# Patient Record
Sex: Female | Born: 1950
Health system: Southern US, Community
[De-identification: ages and names within clinical notes are randomized; demographics above are authoritative.]

## PROBLEM LIST (undated history)

## (undated) DIAGNOSIS — K222 Esophageal obstruction: Secondary | ICD-10-CM

## (undated) DIAGNOSIS — I493 Ventricular premature depolarization: Secondary | ICD-10-CM

## (undated) DIAGNOSIS — K589 Irritable bowel syndrome without diarrhea: Secondary | ICD-10-CM

## (undated) DIAGNOSIS — D126 Benign neoplasm of colon, unspecified: Secondary | ICD-10-CM

## (undated) DIAGNOSIS — K219 Gastro-esophageal reflux disease without esophagitis: Secondary | ICD-10-CM

## (undated) DIAGNOSIS — T7840XA Allergy, unspecified, initial encounter: Secondary | ICD-10-CM

## (undated) DIAGNOSIS — F32A Depression, unspecified: Secondary | ICD-10-CM

## (undated) DIAGNOSIS — M1990 Unspecified osteoarthritis, unspecified site: Secondary | ICD-10-CM

## (undated) DIAGNOSIS — I10 Essential (primary) hypertension: Secondary | ICD-10-CM

## (undated) DIAGNOSIS — E039 Hypothyroidism, unspecified: Secondary | ICD-10-CM

## (undated) DIAGNOSIS — R079 Chest pain, unspecified: Secondary | ICD-10-CM

## (undated) DIAGNOSIS — R609 Edema, unspecified: Secondary | ICD-10-CM

## (undated) DIAGNOSIS — R102 Pelvic and perineal pain: Secondary | ICD-10-CM

## (undated) DIAGNOSIS — IMO0002 Reserved for concepts with insufficient information to code with codable children: Secondary | ICD-10-CM

## (undated) DIAGNOSIS — D219 Benign neoplasm of connective and other soft tissue, unspecified: Secondary | ICD-10-CM

## (undated) DIAGNOSIS — K5730 Diverticulosis of large intestine without perforation or abscess without bleeding: Secondary | ICD-10-CM

## (undated) DIAGNOSIS — F329 Major depressive disorder, single episode, unspecified: Secondary | ICD-10-CM

## (undated) DIAGNOSIS — S098XXA Other specified injuries of head, initial encounter: Secondary | ICD-10-CM

## (undated) DIAGNOSIS — E785 Hyperlipidemia, unspecified: Secondary | ICD-10-CM

## (undated) HISTORY — DX: Benign neoplasm of connective and other soft tissue, unspecified: D21.9

## (undated) HISTORY — DX: Irritable bowel syndrome, unspecified: K58.9

## (undated) HISTORY — DX: Gastro-esophageal reflux disease without esophagitis: K21.9

## (undated) HISTORY — PX: DILATION AND CURETTAGE OF UTERUS: SHX78

## (undated) HISTORY — DX: Benign neoplasm of colon, unspecified: D12.6

## (undated) HISTORY — DX: Chest pain, unspecified: R07.9

## (undated) HISTORY — DX: Ventricular premature depolarization: I49.3

## (undated) HISTORY — DX: Hypothyroidism, unspecified: E03.9

## (undated) HISTORY — DX: Allergy, unspecified, initial encounter: T78.40XA

## (undated) HISTORY — DX: Hyperlipidemia, unspecified: E78.5

## (undated) HISTORY — PX: BREAST SURGERY: SHX581

## (undated) HISTORY — DX: Edema, unspecified: R60.9

## (undated) HISTORY — DX: Essential (primary) hypertension: I10

## (undated) HISTORY — DX: Esophageal obstruction: K22.2

## (undated) HISTORY — DX: Major depressive disorder, single episode, unspecified: F32.9

## (undated) HISTORY — DX: Pelvic and perineal pain: R10.2

## (undated) HISTORY — DX: Diverticulosis of large intestine without perforation or abscess without bleeding: K57.30

## (undated) HISTORY — DX: Unspecified osteoarthritis, unspecified site: M19.90

## (undated) HISTORY — DX: Other specified injuries of head, initial encounter: S09.8XXA

## (undated) HISTORY — DX: Reserved for concepts with insufficient information to code with codable children: IMO0002

## (undated) HISTORY — DX: Depression, unspecified: F32.A

---

## 1997-04-19 ENCOUNTER — Encounter: Admission: RE | Admit: 1997-04-19 | Discharge: 1997-07-18 | Payer: Self-pay | Admitting: Internal Medicine

## 1997-10-31 ENCOUNTER — Ambulatory Visit (HOSPITAL_COMMUNITY): Admission: RE | Admit: 1997-10-31 | Discharge: 1997-10-31 | Payer: Self-pay | Admitting: Obstetrics & Gynecology

## 1998-07-02 ENCOUNTER — Other Ambulatory Visit: Admission: RE | Admit: 1998-07-02 | Discharge: 1998-07-02 | Payer: Self-pay | Admitting: Internal Medicine

## 1998-08-13 ENCOUNTER — Ambulatory Visit (HOSPITAL_COMMUNITY): Admission: RE | Admit: 1998-08-13 | Discharge: 1998-08-13 | Payer: Self-pay | Admitting: Surgery

## 1998-08-13 ENCOUNTER — Encounter: Payer: Self-pay | Admitting: Surgery

## 1999-09-03 ENCOUNTER — Other Ambulatory Visit: Admission: RE | Admit: 1999-09-03 | Discharge: 1999-09-03 | Payer: Self-pay | Admitting: Internal Medicine

## 2000-10-12 ENCOUNTER — Other Ambulatory Visit: Admission: RE | Admit: 2000-10-12 | Discharge: 2000-10-12 | Payer: Self-pay | Admitting: Obstetrics & Gynecology

## 2002-02-06 ENCOUNTER — Other Ambulatory Visit: Admission: RE | Admit: 2002-02-06 | Discharge: 2002-02-06 | Payer: Self-pay | Admitting: Internal Medicine

## 2003-10-02 ENCOUNTER — Other Ambulatory Visit: Admission: RE | Admit: 2003-10-02 | Discharge: 2003-10-02 | Payer: Self-pay | Admitting: Internal Medicine

## 2004-01-04 ENCOUNTER — Ambulatory Visit: Payer: Self-pay | Admitting: Internal Medicine

## 2004-01-25 ENCOUNTER — Ambulatory Visit: Payer: Self-pay | Admitting: Internal Medicine

## 2004-01-30 ENCOUNTER — Ambulatory Visit: Payer: Self-pay | Admitting: Internal Medicine

## 2004-03-05 ENCOUNTER — Ambulatory Visit: Payer: Self-pay | Admitting: Internal Medicine

## 2004-04-30 ENCOUNTER — Ambulatory Visit: Payer: Self-pay | Admitting: Internal Medicine

## 2004-07-11 ENCOUNTER — Ambulatory Visit: Payer: Self-pay | Admitting: Internal Medicine

## 2004-08-08 ENCOUNTER — Ambulatory Visit: Payer: Self-pay | Admitting: Internal Medicine

## 2004-09-03 ENCOUNTER — Ambulatory Visit: Payer: Self-pay | Admitting: Internal Medicine

## 2004-09-03 ENCOUNTER — Other Ambulatory Visit: Admission: RE | Admit: 2004-09-03 | Discharge: 2004-09-03 | Payer: Self-pay | Admitting: Neurosurgery

## 2004-12-09 ENCOUNTER — Ambulatory Visit: Payer: Self-pay | Admitting: Internal Medicine

## 2004-12-16 ENCOUNTER — Ambulatory Visit: Payer: Self-pay

## 2005-01-26 ENCOUNTER — Ambulatory Visit: Payer: Self-pay | Admitting: Internal Medicine

## 2005-03-25 ENCOUNTER — Ambulatory Visit: Payer: Self-pay | Admitting: Internal Medicine

## 2005-03-30 ENCOUNTER — Ambulatory Visit: Payer: Self-pay | Admitting: Internal Medicine

## 2005-05-21 ENCOUNTER — Ambulatory Visit: Payer: Self-pay | Admitting: Internal Medicine

## 2005-07-31 ENCOUNTER — Ambulatory Visit: Payer: Self-pay | Admitting: Internal Medicine

## 2005-09-03 ENCOUNTER — Ambulatory Visit: Payer: Self-pay | Admitting: Internal Medicine

## 2005-09-28 ENCOUNTER — Ambulatory Visit: Payer: Self-pay | Admitting: Internal Medicine

## 2005-09-30 ENCOUNTER — Ambulatory Visit: Payer: Self-pay | Admitting: Internal Medicine

## 2005-11-05 ENCOUNTER — Ambulatory Visit: Payer: Self-pay | Admitting: Internal Medicine

## 2005-11-06 ENCOUNTER — Ambulatory Visit: Payer: Self-pay | Admitting: Gastroenterology

## 2005-11-12 ENCOUNTER — Ambulatory Visit: Payer: Self-pay

## 2005-12-07 ENCOUNTER — Ambulatory Visit: Payer: Self-pay | Admitting: Internal Medicine

## 2006-02-02 ENCOUNTER — Ambulatory Visit: Payer: Self-pay | Admitting: Internal Medicine

## 2006-02-02 ENCOUNTER — Other Ambulatory Visit: Admission: RE | Admit: 2006-02-02 | Discharge: 2006-02-02 | Payer: Self-pay | Admitting: Internal Medicine

## 2006-02-02 ENCOUNTER — Encounter: Payer: Self-pay | Admitting: Internal Medicine

## 2006-02-05 ENCOUNTER — Ambulatory Visit: Payer: Self-pay | Admitting: Gastroenterology

## 2006-02-13 ENCOUNTER — Ambulatory Visit: Payer: Self-pay | Admitting: Family Medicine

## 2006-03-05 ENCOUNTER — Ambulatory Visit: Payer: Self-pay | Admitting: Internal Medicine

## 2006-03-08 ENCOUNTER — Ambulatory Visit: Payer: Self-pay | Admitting: Internal Medicine

## 2006-03-08 LAB — CONVERTED CEMR LAB
Fecal Occult Blood: NEGATIVE
OCCULT 1: NEGATIVE
OCCULT 2: NEGATIVE
OCCULT 3: NEGATIVE
OCCULT 4: NEGATIVE
OCCULT 5: NEGATIVE

## 2006-04-05 ENCOUNTER — Ambulatory Visit: Payer: Self-pay | Admitting: Internal Medicine

## 2006-05-17 ENCOUNTER — Ambulatory Visit: Payer: Self-pay | Admitting: Internal Medicine

## 2006-05-31 ENCOUNTER — Ambulatory Visit: Payer: Self-pay | Admitting: Internal Medicine

## 2006-06-28 ENCOUNTER — Ambulatory Visit: Payer: Self-pay | Admitting: Internal Medicine

## 2006-07-19 DIAGNOSIS — I10 Essential (primary) hypertension: Secondary | ICD-10-CM | POA: Insufficient documentation

## 2006-07-19 DIAGNOSIS — J45909 Unspecified asthma, uncomplicated: Secondary | ICD-10-CM | POA: Insufficient documentation

## 2006-07-19 DIAGNOSIS — I4949 Other premature depolarization: Secondary | ICD-10-CM | POA: Insufficient documentation

## 2006-07-19 DIAGNOSIS — K589 Irritable bowel syndrome without diarrhea: Secondary | ICD-10-CM | POA: Insufficient documentation

## 2006-07-19 DIAGNOSIS — K219 Gastro-esophageal reflux disease without esophagitis: Secondary | ICD-10-CM | POA: Insufficient documentation

## 2006-07-19 DIAGNOSIS — J309 Allergic rhinitis, unspecified: Secondary | ICD-10-CM | POA: Insufficient documentation

## 2006-08-24 ENCOUNTER — Ambulatory Visit: Payer: Self-pay | Admitting: Internal Medicine

## 2006-08-24 LAB — CONVERTED CEMR LAB
BUN: 12 mg/dL (ref 6–23)
Basophils Absolute: 0 10*3/uL (ref 0.0–0.1)
Basophils Relative: 0.8 % (ref 0.0–1.0)
CO2: 31 meq/L (ref 19–32)
Calcium: 9.8 mg/dL (ref 8.4–10.5)
Creatinine, Ser: 0.7 mg/dL (ref 0.4–1.2)
GFR calc Af Amer: 111 mL/min
Hemoglobin: 13.6 g/dL (ref 12.0–15.0)
Iron: 110 ug/dL (ref 42–145)
MCHC: 33.8 g/dL (ref 30.0–36.0)
Monocytes Absolute: 0.4 10*3/uL (ref 0.2–0.7)
Monocytes Relative: 9 % (ref 3.0–11.0)
Platelets: 235 10*3/uL (ref 150–400)
Potassium: 4 meq/L (ref 3.5–5.1)
RDW: 13.3 % (ref 11.5–14.6)

## 2006-09-02 ENCOUNTER — Ambulatory Visit: Payer: Self-pay | Admitting: Internal Medicine

## 2006-09-28 ENCOUNTER — Ambulatory Visit: Payer: Self-pay | Admitting: Internal Medicine

## 2006-09-29 ENCOUNTER — Ambulatory Visit: Payer: Self-pay | Admitting: Gastroenterology

## 2006-10-11 ENCOUNTER — Ambulatory Visit: Payer: Self-pay | Admitting: Gastroenterology

## 2006-10-11 ENCOUNTER — Encounter: Payer: Self-pay | Admitting: Internal Medicine

## 2006-12-27 ENCOUNTER — Ambulatory Visit: Payer: Self-pay | Admitting: Internal Medicine

## 2007-02-24 ENCOUNTER — Telehealth: Payer: Self-pay | Admitting: Internal Medicine

## 2007-02-25 ENCOUNTER — Ambulatory Visit: Payer: Self-pay | Admitting: Cardiology

## 2007-04-11 DIAGNOSIS — F3289 Other specified depressive episodes: Secondary | ICD-10-CM | POA: Insufficient documentation

## 2007-04-11 DIAGNOSIS — F411 Generalized anxiety disorder: Secondary | ICD-10-CM | POA: Insufficient documentation

## 2007-04-11 DIAGNOSIS — F329 Major depressive disorder, single episode, unspecified: Secondary | ICD-10-CM | POA: Insufficient documentation

## 2007-04-11 DIAGNOSIS — K5730 Diverticulosis of large intestine without perforation or abscess without bleeding: Secondary | ICD-10-CM | POA: Insufficient documentation

## 2007-04-20 ENCOUNTER — Telehealth: Payer: Self-pay | Admitting: Internal Medicine

## 2007-04-21 ENCOUNTER — Ambulatory Visit: Payer: Self-pay | Admitting: Internal Medicine

## 2007-04-27 ENCOUNTER — Ambulatory Visit: Payer: Self-pay | Admitting: Internal Medicine

## 2007-04-27 LAB — CONVERTED CEMR LAB
AST: 15 units/L (ref 0–37)
Albumin: 3.9 g/dL (ref 3.5–5.2)
Alkaline Phosphatase: 65 units/L (ref 39–117)
BUN: 10 mg/dL (ref 6–23)
Bilirubin Urine: NEGATIVE
Bilirubin, Direct: 0.2 mg/dL (ref 0.0–0.3)
Chloride: 105 meq/L (ref 96–112)
Eosinophils Relative: 1.6 % (ref 0.0–5.0)
Glucose, Bld: 98 mg/dL (ref 70–99)
Glucose, Urine, Semiquant: NEGATIVE
HDL: 47.5 mg/dL (ref >39.0)
Ketones, urine, test strip: NEGATIVE
Lymphocytes Relative: 42 % (ref 12.0–46.0)
Monocytes Relative: 8.1 % (ref 3.0–12.0)
Neutrophils Relative %: 47.9 % (ref 43.0–77.0)
Nitrite: NEGATIVE
Platelets: 202 10*3/uL (ref 150–400)
Potassium: 4 meq/L (ref 3.5–5.1)
Protein, U semiquant: NEGATIVE
Sodium: 141 meq/L (ref 135–145)
Total CHOL/HDL Ratio: 5.2
Total Protein: 6.5 g/dL (ref 6.0–8.3)
Triglycerides: 104 mg/dL (ref 0–149)
Urobilinogen, UA: 0.2
VLDL: 21 mg/dL (ref 0–40)
WBC: 5.2 10*3/uL (ref 4.5–10.5)

## 2007-05-19 ENCOUNTER — Other Ambulatory Visit: Admission: RE | Admit: 2007-05-19 | Discharge: 2007-05-19 | Payer: Self-pay | Admitting: Internal Medicine

## 2007-05-19 ENCOUNTER — Encounter: Payer: Self-pay | Admitting: Internal Medicine

## 2007-05-19 ENCOUNTER — Ambulatory Visit: Payer: Self-pay | Admitting: Internal Medicine

## 2007-05-19 DIAGNOSIS — E785 Hyperlipidemia, unspecified: Secondary | ICD-10-CM | POA: Insufficient documentation

## 2007-05-19 LAB — CONVERTED CEMR LAB: LDL Goal: 130 mg/dL

## 2007-05-20 LAB — CONVERTED CEMR LAB: Pap Smear: NORMAL

## 2007-06-03 ENCOUNTER — Encounter: Payer: Self-pay | Admitting: Internal Medicine

## 2007-06-20 ENCOUNTER — Ambulatory Visit: Payer: Self-pay | Admitting: Internal Medicine

## 2007-06-20 DIAGNOSIS — M549 Dorsalgia, unspecified: Secondary | ICD-10-CM | POA: Insufficient documentation

## 2007-08-10 ENCOUNTER — Telehealth: Payer: Self-pay | Admitting: Internal Medicine

## 2007-08-22 ENCOUNTER — Ambulatory Visit: Payer: Self-pay | Admitting: Internal Medicine

## 2007-08-22 LAB — CONVERTED CEMR LAB
ALT: 19 units/L (ref 0–35)
AST: 17 units/L (ref 0–37)
Albumin: 3.9 g/dL (ref 3.5–5.2)
Cholesterol: 258 mg/dL (ref 0–200)
Direct LDL: 175.1 mg/dL
HDL: 51.6 mg/dL (ref >39.0)
Total Bilirubin: 0.9 mg/dL (ref 0.3–1.2)
Total CHOL/HDL Ratio: 5
Triglycerides: 92 mg/dL (ref 0–149)

## 2007-09-02 ENCOUNTER — Ambulatory Visit: Payer: Self-pay | Admitting: Internal Medicine

## 2007-09-12 ENCOUNTER — Ambulatory Visit: Payer: Self-pay | Admitting: Internal Medicine

## 2007-09-12 DIAGNOSIS — IMO0002 Reserved for concepts with insufficient information to code with codable children: Secondary | ICD-10-CM

## 2007-09-12 HISTORY — DX: Reserved for concepts with insufficient information to code with codable children: IMO0002

## 2007-09-16 ENCOUNTER — Telehealth: Payer: Self-pay | Admitting: Internal Medicine

## 2007-10-17 ENCOUNTER — Telehealth (INDEPENDENT_AMBULATORY_CARE_PROVIDER_SITE_OTHER): Payer: Self-pay | Admitting: *Deleted

## 2007-10-31 ENCOUNTER — Encounter: Admission: RE | Admit: 2007-10-31 | Discharge: 2007-10-31 | Payer: Self-pay | Admitting: Internal Medicine

## 2007-11-02 ENCOUNTER — Encounter: Payer: Self-pay | Admitting: Internal Medicine

## 2007-11-14 ENCOUNTER — Encounter: Payer: Self-pay | Admitting: Internal Medicine

## 2008-01-05 ENCOUNTER — Ambulatory Visit: Payer: Self-pay | Admitting: Internal Medicine

## 2008-01-05 LAB — CONVERTED CEMR LAB
ALT: 22 units/L (ref 0–35)
AST: 22 units/L (ref 0–37)
Albumin: 3.7 g/dL (ref 3.5–5.2)
Alkaline Phosphatase: 51 units/L (ref 39–117)
Bilirubin, Direct: 0.1 mg/dL (ref 0.0–0.3)
Cholesterol: 252 mg/dL (ref 0–200)
Total Protein: 6.4 g/dL (ref 6.0–8.3)
VLDL: 19 mg/dL (ref 0–40)

## 2008-01-11 ENCOUNTER — Ambulatory Visit: Payer: Self-pay | Admitting: Internal Medicine

## 2008-01-12 ENCOUNTER — Ambulatory Visit: Payer: Self-pay | Admitting: Internal Medicine

## 2008-03-26 ENCOUNTER — Ambulatory Visit: Payer: Self-pay | Admitting: Internal Medicine

## 2008-03-26 LAB — CONVERTED CEMR LAB
ALT: 19 units/L (ref 0–35)
AST: 19 units/L (ref 0–37)
Alkaline Phosphatase: 55 units/L (ref 39–117)
Bilirubin, Direct: 0.1 mg/dL (ref 0.0–0.3)
CO2: 30 meq/L (ref 19–32)
Calcium: 9.3 mg/dL (ref 8.4–10.5)
Chloride: 109 meq/L (ref 96–112)
Glucose, Bld: 96 mg/dL (ref 70–99)
Potassium: 4.1 meq/L (ref 3.5–5.1)
Sodium: 146 meq/L — ABNORMAL HIGH (ref 135–145)
Total Bilirubin: 0.8 mg/dL (ref 0.3–1.2)
Total CHOL/HDL Ratio: 4.6
Total Protein: 6.2 g/dL (ref 6.0–8.3)
Triglycerides: 79 mg/dL (ref 0–149)

## 2008-04-05 ENCOUNTER — Ambulatory Visit: Payer: Self-pay | Admitting: Internal Medicine

## 2008-04-24 ENCOUNTER — Telehealth: Payer: Self-pay | Admitting: Internal Medicine

## 2008-05-07 ENCOUNTER — Ambulatory Visit: Payer: Self-pay | Admitting: Internal Medicine

## 2008-06-04 ENCOUNTER — Encounter: Payer: Self-pay | Admitting: Internal Medicine

## 2008-06-05 ENCOUNTER — Encounter: Payer: Self-pay | Admitting: Internal Medicine

## 2008-06-29 ENCOUNTER — Ambulatory Visit: Payer: Self-pay | Admitting: Internal Medicine

## 2008-06-29 LAB — CONVERTED CEMR LAB
ALT: 19 units/L (ref 0–35)
Bilirubin, Direct: 0 mg/dL (ref 0.0–0.3)
HDL: 62.5 mg/dL (ref >39.00)
LDL Cholesterol: 111 mg/dL — ABNORMAL HIGH (ref 0–99)
Total Bilirubin: 0.9 mg/dL (ref 0.3–1.2)
VLDL: 14.4 mg/dL (ref 0.0–40.0)

## 2008-07-05 ENCOUNTER — Ambulatory Visit: Payer: Self-pay | Admitting: Internal Medicine

## 2008-07-05 DIAGNOSIS — E039 Hypothyroidism, unspecified: Secondary | ICD-10-CM | POA: Insufficient documentation

## 2008-07-10 LAB — CONVERTED CEMR LAB
Free T4: 1.3 ng/dL (ref 0.6–1.6)
T3, Free: 2.2 pg/mL — ABNORMAL LOW (ref 2.3–4.2)
TSH: 0.88 microintl units/mL (ref 0.35–5.50)

## 2008-08-08 ENCOUNTER — Telehealth: Payer: Self-pay | Admitting: Internal Medicine

## 2008-08-10 ENCOUNTER — Encounter: Payer: Self-pay | Admitting: Internal Medicine

## 2008-08-16 ENCOUNTER — Telehealth: Payer: Self-pay | Admitting: Internal Medicine

## 2008-10-09 ENCOUNTER — Ambulatory Visit: Payer: Self-pay | Admitting: Internal Medicine

## 2008-11-08 ENCOUNTER — Telehealth: Payer: Self-pay | Admitting: Internal Medicine

## 2008-11-12 ENCOUNTER — Telehealth: Payer: Self-pay | Admitting: Internal Medicine

## 2008-11-21 ENCOUNTER — Telehealth: Payer: Self-pay | Admitting: Internal Medicine

## 2008-11-29 ENCOUNTER — Ambulatory Visit: Payer: Self-pay | Admitting: Internal Medicine

## 2008-11-29 ENCOUNTER — Encounter: Payer: Self-pay | Admitting: Internal Medicine

## 2008-12-12 ENCOUNTER — Ambulatory Visit: Payer: Self-pay | Admitting: Internal Medicine

## 2008-12-24 ENCOUNTER — Telehealth: Payer: Self-pay | Admitting: Internal Medicine

## 2009-01-08 ENCOUNTER — Ambulatory Visit: Payer: Self-pay | Admitting: Internal Medicine

## 2009-01-08 ENCOUNTER — Telehealth: Payer: Self-pay | Admitting: Internal Medicine

## 2009-01-14 LAB — CONVERTED CEMR LAB
AST: 28 units/L (ref 0–37)
Alkaline Phosphatase: 51 units/L (ref 39–117)
Cholesterol: 251 mg/dL — ABNORMAL HIGH (ref 0–200)
Total Bilirubin: 0.8 mg/dL (ref 0.3–1.2)
Total CHOL/HDL Ratio: 6

## 2009-01-15 ENCOUNTER — Ambulatory Visit: Payer: Self-pay | Admitting: Internal Medicine

## 2009-01-16 ENCOUNTER — Telehealth: Payer: Self-pay | Admitting: Internal Medicine

## 2009-01-17 ENCOUNTER — Ambulatory Visit: Payer: Self-pay | Admitting: Internal Medicine

## 2009-01-17 DIAGNOSIS — R0609 Other forms of dyspnea: Secondary | ICD-10-CM | POA: Insufficient documentation

## 2009-01-17 DIAGNOSIS — R0989 Other specified symptoms and signs involving the circulatory and respiratory systems: Secondary | ICD-10-CM | POA: Insufficient documentation

## 2009-01-18 LAB — CONVERTED CEMR LAB: Pap Smear: NORMAL

## 2009-02-14 ENCOUNTER — Ambulatory Visit: Payer: Self-pay | Admitting: Internal Medicine

## 2009-02-19 ENCOUNTER — Telehealth (INDEPENDENT_AMBULATORY_CARE_PROVIDER_SITE_OTHER): Payer: Self-pay | Admitting: *Deleted

## 2009-02-19 LAB — CONVERTED CEMR LAB
BUN: 7 mg/dL (ref 6–23)
CO2: 30 meq/L (ref 19–32)
Chloride: 108 meq/L (ref 96–112)
Creatinine, Ser: 0.8 mg/dL (ref 0.4–1.2)
Glucose, Bld: 93 mg/dL (ref 70–99)

## 2009-03-08 ENCOUNTER — Telehealth: Payer: Self-pay | Admitting: Internal Medicine

## 2009-03-12 ENCOUNTER — Telehealth: Payer: Self-pay | Admitting: Internal Medicine

## 2009-03-25 ENCOUNTER — Ambulatory Visit: Payer: Self-pay | Admitting: Internal Medicine

## 2009-03-28 ENCOUNTER — Telehealth (INDEPENDENT_AMBULATORY_CARE_PROVIDER_SITE_OTHER): Payer: Self-pay | Admitting: *Deleted

## 2009-04-08 ENCOUNTER — Ambulatory Visit: Payer: Self-pay | Admitting: Internal Medicine

## 2009-04-10 LAB — CONVERTED CEMR LAB
Alkaline Phosphatase: 45 units/L (ref 39–117)
Bilirubin, Direct: 0 mg/dL (ref 0.0–0.3)
LDL Cholesterol: 80 mg/dL (ref 0–99)
Total CHOL/HDL Ratio: 3

## 2009-04-15 ENCOUNTER — Ambulatory Visit: Payer: Self-pay | Admitting: Internal Medicine

## 2009-04-15 DIAGNOSIS — F988 Other specified behavioral and emotional disorders with onset usually occurring in childhood and adolescence: Secondary | ICD-10-CM | POA: Insufficient documentation

## 2009-05-07 ENCOUNTER — Telehealth: Payer: Self-pay | Admitting: Internal Medicine

## 2009-05-08 ENCOUNTER — Telehealth (INDEPENDENT_AMBULATORY_CARE_PROVIDER_SITE_OTHER): Payer: Self-pay | Admitting: *Deleted

## 2009-06-10 ENCOUNTER — Ambulatory Visit: Payer: Self-pay | Admitting: Internal Medicine

## 2009-07-16 ENCOUNTER — Encounter: Payer: Self-pay | Admitting: Internal Medicine

## 2009-08-12 ENCOUNTER — Ambulatory Visit: Payer: Self-pay | Admitting: Internal Medicine

## 2009-08-12 DIAGNOSIS — R609 Edema, unspecified: Secondary | ICD-10-CM | POA: Insufficient documentation

## 2009-08-12 HISTORY — DX: Edema, unspecified: R60.9

## 2009-09-10 ENCOUNTER — Ambulatory Visit: Payer: Self-pay | Admitting: Internal Medicine

## 2009-09-10 DIAGNOSIS — H00029 Hordeolum internum unspecified eye, unspecified eyelid: Secondary | ICD-10-CM | POA: Insufficient documentation

## 2009-09-10 LAB — CONVERTED CEMR LAB
BUN: 7 mg/dL (ref 6–23)
Chloride: 100 meq/L (ref 96–112)
Glucose, Bld: 94 mg/dL (ref 70–99)
Potassium: 4.4 meq/L (ref 3.5–5.1)

## 2009-09-24 ENCOUNTER — Telehealth: Payer: Self-pay | Admitting: Internal Medicine

## 2009-09-26 ENCOUNTER — Telehealth: Payer: Self-pay | Admitting: Internal Medicine

## 2009-10-11 ENCOUNTER — Ambulatory Visit: Payer: Self-pay | Admitting: Internal Medicine

## 2009-10-11 LAB — CONVERTED CEMR LAB
Blood in Urine, dipstick: NEGATIVE
Glucose, Urine, Semiquant: NEGATIVE
Ketones, urine, test strip: NEGATIVE
Protein, U semiquant: NEGATIVE
WBC Urine, dipstick: NEGATIVE
pH: 7

## 2009-10-14 ENCOUNTER — Encounter: Admission: RE | Admit: 2009-10-14 | Discharge: 2009-10-14 | Payer: Self-pay | Admitting: Internal Medicine

## 2009-10-15 LAB — CONVERTED CEMR LAB
Basophils Relative: 1.2 % (ref 0.0–3.0)
CO2: 29 meq/L (ref 19–32)
Chloride: 99 meq/L (ref 96–112)
Creatinine, Ser: 0.7 mg/dL (ref 0.4–1.2)
Eosinophils Absolute: 0.2 10*3/uL (ref 0.0–0.7)
Eosinophils Relative: 2.5 % (ref 0.0–5.0)
HCT: 41.5 % (ref 36.0–46.0)
Hemoglobin: 14.3 g/dL (ref 12.0–15.0)
Lymphs Abs: 2 10*3/uL (ref 0.7–4.0)
MCHC: 34.5 g/dL (ref 30.0–36.0)
MCV: 87.9 fL (ref 78.0–100.0)
Monocytes Absolute: 0.7 10*3/uL (ref 0.1–1.0)
Neutro Abs: 3.5 10*3/uL (ref 1.4–7.7)
Neutrophils Relative %: 54.5 % (ref 43.0–77.0)
Potassium: 4 meq/L (ref 3.5–5.1)
RBC: 4.72 M/uL (ref 3.87–5.11)

## 2009-10-18 ENCOUNTER — Ambulatory Visit: Payer: Self-pay | Admitting: Internal Medicine

## 2009-10-29 ENCOUNTER — Ambulatory Visit: Payer: Self-pay | Admitting: Internal Medicine

## 2009-12-02 ENCOUNTER — Ambulatory Visit: Payer: Self-pay | Admitting: Internal Medicine

## 2009-12-02 LAB — CONVERTED CEMR LAB
ALT: 17 units/L (ref 0–35)
BUN: 14 mg/dL (ref 6–23)
Basophils Absolute: 0.1 10*3/uL (ref 0.0–0.1)
Bilirubin Urine: NEGATIVE
Bilirubin, Direct: 0.1 mg/dL (ref 0.0–0.3)
Blood in Urine, dipstick: NEGATIVE
Cholesterol: 171 mg/dL (ref 0–200)
Creatinine, Ser: 0.8 mg/dL (ref 0.4–1.2)
Eosinophils Relative: 2.1 % (ref 0.0–5.0)
GFR calc non Af Amer: 77.42 mL/min (ref >60)
Glucose, Bld: 103 mg/dL — ABNORMAL HIGH (ref 70–99)
Glucose, Urine, Semiquant: NEGATIVE
HCT: 39.1 % (ref 36.0–46.0)
Ketones, urine, test strip: NEGATIVE
LDL Cholesterol: 104 mg/dL — ABNORMAL HIGH (ref 0–99)
Lymphs Abs: 1.8 10*3/uL (ref 0.7–4.0)
MCV: 87.8 fL (ref 78.0–100.0)
Monocytes Absolute: 0.5 10*3/uL (ref 0.1–1.0)
Neutrophils Relative %: 54.3 % (ref 43.0–77.0)
Platelets: 198 10*3/uL (ref 150.0–400.0)
Protein, U semiquant: NEGATIVE
RDW: 13.9 % (ref 11.5–14.6)
TSH: 0.72 microintl units/mL (ref 0.35–5.50)
Total Bilirubin: 0.5 mg/dL (ref 0.3–1.2)
Triglycerides: 89 mg/dL (ref 0.0–149.0)
Urobilinogen, UA: 0.2
VLDL: 17.8 mg/dL (ref 0.0–40.0)
WBC: 5.4 10*3/uL (ref 4.5–10.5)
pH: 6

## 2009-12-09 ENCOUNTER — Ambulatory Visit: Payer: Self-pay | Admitting: Internal Medicine

## 2009-12-09 LAB — HM MAMMOGRAPHY

## 2010-01-01 ENCOUNTER — Telehealth: Payer: Self-pay | Admitting: Internal Medicine

## 2010-01-06 ENCOUNTER — Ambulatory Visit: Payer: Self-pay | Admitting: Internal Medicine

## 2010-02-10 ENCOUNTER — Ambulatory Visit
Admission: RE | Admit: 2010-02-10 | Discharge: 2010-02-10 | Payer: Self-pay | Source: Home / Self Care | Attending: Internal Medicine | Admitting: Internal Medicine

## 2010-02-10 LAB — CONVERTED CEMR LAB
Blood in Urine, dipstick: NEGATIVE
Glucose, Urine, Semiquant: NEGATIVE
Nitrite: NEGATIVE
Specific Gravity, Urine: <1.005
WBC Urine, dipstick: NEGATIVE
pH: 6

## 2010-02-18 NOTE — Progress Notes (Signed)
Summary: questons re: BP  Phone Note Call from Patient   Caller: Patient Call For: Stacie Glaze MD Summary of Call: Pt's BP is 146/96 and 158/88.  Taking 5 mg. Amlodipine, 12.5 mg of HCTZ (see previous phone note)  and feels she may need 10 mg. again???  What does Dr. Lovell Sheehan think?  She had edema with the higher dosage of Amlodipine.  She is going to [place]. [phone] Initial call taken by: Lynann Beaver CMA,  September 26, 2009 2:47 PM  Follow-up for Phone Call        increase carvedilol to 25 mg by mouth two times a day   Worried about pulse rate going too low.  Her pulse runs about 63. Lynann Beaver [facility]  September 27, 2009 8:10 AM  Follow-up by: Birdie Sons MD,  September 26, 2009 5:57 PM  Additional Follow-up for Phone Call Additional follow up Details #1::        may start with  25 mg in the am and 12.5 in the pm Additional Follow-up by: Stacie Glaze MD,  September 27, 2009 9:11 AM    New/Updated Medications: CARVEDILOL 12.5 MG TABS (CARVEDILOL) Take 25 mg in the am and 12.5 mg in pm.   Notified pt.

## 2010-02-18 NOTE — Assessment & Plan Note (Signed)
Summary: 3 month rov/njr   Vital Signs:  Patient profile:   60 year old female Height:      69 inches Weight:      166 pounds BMI:     24.60 Temp:     98.2 degrees F oral Pulse rate:   72 / minute Resp:     14 per minute BP sitting:   116 / 76  (left arm)  Vitals Entered By: Willy Eddy, LPN (September 10, 2009 1:36 PM) CC: roa after changing med to exforge due to ankle swelling Is Patient Diabetic? No   Primary Care Provider:  Stacie Glaze MD  CC:  roa after changing med to exforge due to ankle swelling.  History of Present Illness: the amlodipine  Preventive Screening-Counseling & Management  Alcohol-Tobacco     Smoking Status: quit     Year Quit: 1980     Passive Smoke Exposure: no  Problems Prior to Update: 1)  Edema  (ICD-782.3) 2)  Add  (ICD-314.00) 3)  Dyspnea On Exertion  (ICD-786.09) 4)  Hypertension  (ICD-401.9) 5)  Palpitations, Recurrent  (ICD-785.1) 6)  Premature Ventricular Contractions  (ICD-427.69) 7)  Unspecified Hypothyroidism  (ICD-244.9) 8)  Back Pain, Lumbar, With Radiculopathy  (ICD-724.4) 9)  Uns Advrs Eff Uns Rx Medicinal&biological Sbstnc  (ICD-995.20) 10)  Back Pain  (ICD-724.5) 11)  Preventive Health Care  (ICD-V70.0) 12)  Hyperlipidemia  (ICD-272.4) 13)  Fibroids, Uterus  (ICD-218.9) 14)  Anxiety  (ICD-300.00) 15)  Depression  (ICD-311) 16)  Diverticulosis, Sigmoid Colon  (ICD-562.10) 17)  Gerd  (ICD-530.81) 18)  Asthma  (ICD-493.90) 19)  Allergic Rhinitis  (ICD-477.9) 20)  Irritable Bowel Syndrome  (ICD-564.1)  Current Problems (verified): 1)  Edema  (ICD-782.3) 2)  Add  (ICD-314.00) 3)  Dyspnea On Exertion  (ICD-786.09) 4)  Hypertension  (ICD-401.9) 5)  Palpitations, Recurrent  (ICD-785.1) 6)  Premature Ventricular Contractions  (ICD-427.69) 7)  Unspecified Hypothyroidism  (ICD-244.9) 8)  Back Pain, Lumbar, With Radiculopathy  (ICD-724.4) 9)  Uns Advrs Eff Uns Rx Medicinal&biological Sbstnc  (ICD-995.20) 10)  Back  Pain  (ICD-724.5) 11)  Preventive Health Care  (ICD-V70.0) 12)  Hyperlipidemia  (ICD-272.4) 13)  Fibroids, Uterus  (ICD-218.9) 14)  Anxiety  (ICD-300.00) 15)  Depression  (ICD-311) 16)  Diverticulosis, Sigmoid Colon  (ICD-562.10) 17)  Gerd  (ICD-530.81) 18)  Asthma  (ICD-493.90) 19)  Allergic Rhinitis  (ICD-477.9) 20)  Irritable Bowel Syndrome  (ICD-564.1)  Medications Prior to Update: 1)  Carvedilol 12.5 Mg Tabs (Carvedilol) .... Take Two Times A Day 2)  Symbicort 80-4.5 Mcg/act  Aero (Budesonide-Formoterol Fumarate) .... Two Puff By Mouth Two Times A Day As Needed 3)  Vivelle-Dot 0.0375 Mg/24hr Pttw (Estradiol) .... Chasnge 2 Times A Week 4)  Prometrium 200 Mg Caps (Progesterone Micronized) .Marland Kitchen.. 1 Once Daily 5)  Synthroid 137 Mcg Tabs (Levothyroxine Sodium) .... One By Mouth Daily 6)  Fexofenadine Hcl 180 Mg Tabs (Fexofenadine Hcl) .Marland Kitchen.. 1 Once Daily 7)  Crestor 20 Mg Tabs (Rosuvastatin Calcium) .... One By Mouth Weekly 8)  Lorazepam 0.5 Mg Tabs (Lorazepam) .Marland Kitchen.. 1 Once Daily As Needed For Sleep 9)  Concerta 36 Mg Cr-Tabs (Methylphenidate Hcl) .... One By Mouth Q Daily 10)  Exforge Hct 10-320-25 Mg Tabs (Amlodipine-Valsartan-Hctz) .... 1/2 By Mouth Daily  Current Medications (verified): 1)  Carvedilol 12.5 Mg Tabs (Carvedilol) .... Take One  Po  Two Times A Day 2)  Symbicort 80-4.5 Mcg/act  Aero (Budesonide-Formoterol Fumarate) .... Two Puff By Mouth Two  Times A Day As Needed 3)  Vivelle-Dot 0.0375 Mg/24hr Pttw (Estradiol) .... Chasnge 2 Times A Week 4)  Prometrium 200 Mg Caps (Progesterone Micronized) .Marland Kitchen.. 1 Once Daily 5)  Synthroid 137 Mcg Tabs (Levothyroxine Sodium) .... One By Mouth Daily 6)  Crestor 20 Mg Tabs (Rosuvastatin Calcium) .... One By Mouth Weekly 7)  Lorazepam 0.5 Mg Tabs (Lorazepam) .Marland Kitchen.. 1 Once Daily As Needed For Sleep 8)  Concerta 36 Mg Cr-Tabs (Methylphenidate Hcl) .... One By Mouth Q Daily 9)  Exforge Hct 10-320-25 Mg Tabs (Amlodipine-Valsartan-Hctz) .... 1/2 By  Mouth Daily  Allergies (verified): No Known Drug Allergies  Past History:  Family History: Last updated: 07/19/2006 Family History Hypertension Family History Other cancer-Pancreatic Family History of Cardiovascular disorder Fam hx CAD  Social History: Last updated: 07/19/2006 Former Smoker Alcohol use-yes Married  Risk Factors: Smoking Status: quit (09/10/2009) Passive Smoke Exposure: no (09/10/2009)  Past medical, surgical, family and social histories (including risk factors) reviewed, and no changes noted (except as noted below).  Past Medical History: Reviewed history from 05/19/2007 and no changes required. Current Problems:  FIBROIDS, UTERUS (ICD-218.9) ANXIETY (ICD-300.00) DEPRESSION (ICD-311) DIVERTICULOSIS, SIGMOID COLON (ICD-562.10) BLUNT HEAD TRAUMA (ICD-959.01) GERD (ICD-530.81) ASTHMA (ICD-493.90) ALLERGIC RHINITIS (ICD-477.9) PREMATURE VENTRICULAR CONTRACTIONS (ICD-427.69) IRRITABLE BOWEL SYNDROME (ICD-564.1) HYPERTENSION (ICD-401.9) Hyperlipidemia  Past Surgical History: Reviewed history from 07/19/2006 and no changes required. Colonoscopy-02/05/2006 D&C Fibroid tumors- breast  Family History: Reviewed history from 07/19/2006 and no changes required. Family History Hypertension Family History Other cancer-Pancreatic Family History of Cardiovascular disorder Fam hx CAD  Social History: Reviewed history from 07/19/2006 and no changes required. Former Smoker Alcohol use-yes Married  Review of Systems  The patient denies anorexia, fever, weight loss, weight gain, vision loss, decreased hearing, hoarseness, chest pain, syncope, dyspnea on exertion, peripheral edema, prolonged cough, headaches, hemoptysis, abdominal pain, melena, hematochezia, severe indigestion/heartburn, hematuria, incontinence, genital sores, muscle weakness, suspicious skin lesions, transient blindness, difficulty walking, depression, unusual weight change, abnormal bleeding,  enlarged lymph nodes, angioedema, and breast masses.    Physical Exam  General:  uncomfortable but in no acute distresswell-developed.   Head:  normocephalic.   Eyes:  pupils equal and pupils round.   Ears:  R ear normal and L ear normal.   Nose:  External nasal examination shows no deformity or inflammation. Nasal mucosa are pink and moist without lesions or exudates. Mouth:  good dentition and pharynx pink and moist.   Neck:  supple.   Lungs:  normal respiratory effort and no wheezes.   Heart:  normal rate and regular rhythm.   Abdomen:  Bowel sounds positive,abdomen soft and non-tender without masses, organomegaly or hernias noted. Msk:  there is slight tenderness in the left lumbar region.  Muscles were perhaps slightly tighter; straight leg test was negative.  Neurovascular structures intact Neurologic:  No cranial nerve deficits noted. Station and gait are normal. Plantar reflexes are down-going bilaterally. DTRs are symmetrical throughout. Sensory, motor and coordinative functions appear intact.   Impression & Recommendations:  Problem # 1:  HYPERTENSION (ICD-401.9) The blood pressure is well controlled and the edema has resolved Her updated medication list for this problem includes:    Carvedilol 12.5 Mg Tabs (Carvedilol) .Marland Kitchen... Take one  po  two times a day    Exforge Hct 10-320-25 Mg Tabs (Amlodipine-valsartan-hctz) .Marland Kitchen... 1/2 by mouth daily  BP today: 116/76 Prior BP: 116/72 (08/12/2009)  Prior 10 Yr Risk Heart Disease: 3 % (06/10/2009)  Labs Reviewed: K+: 3.9 (02/14/2009) Creat: : 0.8 (02/14/2009)  Chol: 159 (04/08/2009)   HDL: 60.80 (04/08/2009)   LDL: 80 (04/08/2009)   TG: 91.0 (04/08/2009)  Problem # 2:  GERD (ICD-530.81)  Labs Reviewed: Hgb: 14.3 (04/27/2007)   Hct: 42.5 (04/27/2007)  Problem # 3:  HORDEOLUM, INTERNAL (ICD-373.12)  Complete Medication List: 1)  Carvedilol 12.5 Mg Tabs (Carvedilol) .... Take one  po  two times a day 2)  Symbicort 80-4.5  Mcg/act Aero (Budesonide-formoterol fumarate) .... Two puff by mouth two times a day as needed 3)  Vivelle-dot 0.0375 Mg/24hr Pttw (Estradiol) .... Chasnge 2 times a week 4)  Prometrium 200 Mg Caps (Progesterone micronized) .Marland Kitchen.. 1 once daily 5)  Synthroid 137 Mcg Tabs (Levothyroxine sodium) .... One by mouth daily 6)  Crestor 20 Mg Tabs (Rosuvastatin calcium) .... One by mouth weekly 7)  Lorazepam 0.5 Mg Tabs (Lorazepam) .Marland Kitchen.. 1 once daily as needed for sleep 8)  Concerta 36 Mg Cr-tabs (Methylphenidate hcl) .... One by mouth q daily 9)  Exforge Hct 10-320-25 Mg Tabs (Amlodipine-valsartan-hctz) .... 1/2 by mouth daily  Other Orders: Venipuncture ([address]) TLB-BMP (Basic Metabolic Panel-BMET) (80048-METABOL)  Patient Instructions: 1)  3-4 months for CPX Prescriptions: LORAZEPAM 0.5 MG TABS (LORAZEPAM) 1 once daily as needed for sleep  #30 x 3   Entered and Authorized by:   Stacie Glaze MD   Signed by:   Stacie Glaze MD on 09/10/2009   Method used:   Print then Give to Patient   RxID:   [id number] CARVEDILOL 12.5 MG TABS (CARVEDILOL) Take one  po  two times a day  #180 x 6   Entered and Authorized by:   Stacie Glaze MD   Signed by:   Stacie Glaze MD on 09/10/2009   Method used:   Electronically to        [facility] [address] [place] (retail)       [address] [place]       [place], [place]  [address]       Ph: [phone]       Fax: [phone]   RxID:   [id number] CONCERTA 36 MG CR-TABS (METHYLPHENIDATE HCL) one by mouth q daily  #30 x 0   Entered and Authorized by:   Stacie Glaze MD   Signed by:   Stacie Glaze MD on 09/10/2009   Method used:   Print then Give to Patient   RxID:   [id number] EXFORGE HCT 10-320-25 MG TABS (AMLODIPINE-VALSARTAN-HCTZ) 1/2 by mouth daily  #30 x 0   Entered and Authorized by:   Stacie Glaze MD   Signed by:   Stacie Glaze MD on 09/10/2009   Method used:   Print then Give to Patient   RxID:   [id number] CONCERTA 36  MG CR-TABS (METHYLPHENIDATE HCL) one by mouth q daily  #30 x 0   Entered and Authorized by:   Stacie Glaze MD   Signed by:   Stacie Glaze MD on 09/10/2009   Method used:   Print then Give to Patient   RxID:   [id number] CONCERTA 36 MG CR-TABS (METHYLPHENIDATE HCL) one by mouth q daily  #30 x 0   Entered and Authorized by:   Stacie Glaze MD   Signed by:   Stacie Glaze MD on 09/10/2009   Method used:   Print then Give to Patient   RxID:   [id number] EXFORGE HCT 10-320-25 MG TABS (AMLODIPINE-VALSARTAN-HCTZ) 1/2 by mouth daily  #30 x 3  Entered and Authorized by:   Stacie Glaze MD   Signed by:   Stacie Glaze MD on 09/10/2009   Method used:   Electronically to        [facility] [address] [place] (retail)       [address] [place].       [place], [place]  [address]       Ph: [phone]       Fax: [phone]   RxID:   [id number]   Appended Document: Orders Update     Clinical Lists Changes  Orders: Added new Service order of Specimen Handling ([id number]) - Signed

## 2010-02-18 NOTE — Assessment & Plan Note (Signed)
Summary: 2 month rov/njr   Vital Signs:  Patient profile:   60 year old female Height:      69 inches Weight:      166 pounds BMI:     24.60 Temp:     98.3 degrees F oral Pulse rate:   72 / minute Resp:     12 per minute BP sitting:   118 / 78  (left arm)  Vitals Entered By: Willy Eddy, LPN (Jun 10, 2009 1:32 PM) CC: roa, Hypertension Management   CC:  roa and Hypertension Management.  History of Present Illness: the pt on ADD had tried the vyvance  but when the med "ran out" at the end of the day and she had a reflex effect when she resumed the concert this was lessen concerta 36 and this is improved the weigth is down  noted slight ankle swelling with the medications noted slight bump in the blood pressure in the lat evenings to 130 range   Hypertension History:      She denies headache, chest pain, palpitations, dyspnea with exertion, orthopnea, PND, peripheral edema, visual symptoms, neurologic problems, syncope, and side effects from treatment.        Positive major cardiovascular risk factors include female age 61 years old or older, hyperlipidemia, and hypertension.  Negative major cardiovascular risk factors include negative family history for ischemic heart disease and non-tobacco-user status.     Preventive Screening-Counseling & Management  Alcohol-Tobacco     Smoking Status: quit     Year Quit: 1980     Passive Smoke Exposure: no  Problems Prior to Update: 1)  Add  (ICD-314.00) 2)  Dyspnea On Exertion  (ICD-786.09) 3)  Hypertension  (ICD-401.9) 4)  Palpitations, Recurrent  (ICD-785.1) 5)  Premature Ventricular Contractions  (ICD-427.69) 6)  Unspecified Hypothyroidism  (ICD-244.9) 7)  Back Pain, Lumbar, With Radiculopathy  (ICD-724.4) 8)  Uns Advrs Eff Uns Rx Medicinal&biological Sbstnc  (ICD-995.20) 9)  Back Pain  (ICD-724.5) 10)  Preventive Health Care  (ICD-V70.0) 11)  Hyperlipidemia  (ICD-272.4) 12)  Fibroids, Uterus  (ICD-218.9) 13)   Anxiety  (ICD-300.00) 14)  Depression  (ICD-311) 15)  Diverticulosis, Sigmoid Colon  (ICD-562.10) 16)  Gerd  (ICD-530.81) 17)  Asthma  (ICD-493.90) 18)  Allergic Rhinitis  (ICD-477.9) 19)  Irritable Bowel Syndrome  (ICD-564.1)  Current Problems (verified): 1)  Add  (ICD-314.00) 2)  Dyspnea On Exertion  (ICD-786.09) 3)  Hypertension  (ICD-401.9) 4)  Palpitations, Recurrent  (ICD-785.1) 5)  Premature Ventricular Contractions  (ICD-427.69) 6)  Unspecified Hypothyroidism  (ICD-244.9) 7)  Back Pain, Lumbar, With Radiculopathy  (ICD-724.4) 8)  Uns Advrs Eff Uns Rx Medicinal&biological Sbstnc  (ICD-995.20) 9)  Back Pain  (ICD-724.5) 10)  Preventive Health Care  (ICD-V70.0) 11)  Hyperlipidemia  (ICD-272.4) 12)  Fibroids, Uterus  (ICD-218.9) 13)  Anxiety  (ICD-300.00) 14)  Depression  (ICD-311) 15)  Diverticulosis, Sigmoid Colon  (ICD-562.10) 16)  Gerd  (ICD-530.81) 17)  Asthma  (ICD-493.90) 18)  Allergic Rhinitis  (ICD-477.9) 19)  Irritable Bowel Syndrome  (ICD-564.1)  Medications Prior to Update: 1)  Carvedilol 12.5 Mg Tabs (Carvedilol) .... Take Two Times A Day 2)  Symbicort 80-4.5 Mcg/act  Aero (Budesonide-Formoterol Fumarate) .... Two Puff By Mouth Two Times A Day As Needed 3)  Vivelle-Dot 0.025 Mg/24hr Pttw (Estradiol) .... Change 2 Times A Week 4)  Prometrium 200 Mg Caps (Progesterone Micronized) .Marland Kitchen.. 1 Once Daily 5)  Synthroid 137 Mcg Tabs (Levothyroxine Sodium) .... One By Mouth Daily  6)  Fexofenadine Hcl 180 Mg Tabs (Fexofenadine Hcl) .Marland Kitchen.. 1 Once Daily 7)  Amlodipine Besylate 10 Mg Tabs (Amlodipine Besylate) .... Take One Tablet Once Daily 8)  Crestor 20 Mg Tabs (Rosuvastatin Calcium) .... One By Mouth Weekly 9)  Hydrochlorothiazide 25 Mg Tabs (Hydrochlorothiazide) .... 1/2 Tablet Daily 10)  Lorazepam 0.5 Mg Tabs (Lorazepam) .Marland Kitchen.. 1 Once Daily As Needed 11)  Concerta 36 Mg Cr-Tabs (Methylphenidate Hcl) .... One By Mouth Q Daily  Current Medications (verified): 1)   Carvedilol 12.5 Mg Tabs (Carvedilol) .... Take Two Times A Day 2)  Symbicort 80-4.5 Mcg/act  Aero (Budesonide-Formoterol Fumarate) .... Two Puff By Mouth Two Times A Day As Needed 3)  Vivelle-Dot 0.0375 Mg/24hr Pttw (Estradiol) .... Chasnge 2 Times A Week 4)  Prometrium 200 Mg Caps (Progesterone Micronized) .Marland Kitchen.. 1 Once Daily 5)  Synthroid 137 Mcg Tabs (Levothyroxine Sodium) .... One By Mouth Daily 6)  Fexofenadine Hcl 180 Mg Tabs (Fexofenadine Hcl) .Marland Kitchen.. 1 Once Daily 7)  Amlodipine Besylate 10 Mg Tabs (Amlodipine Besylate) .... Take One Tablet Once Daily 8)  Crestor 20 Mg Tabs (Rosuvastatin Calcium) .... One By Mouth Weekly 9)  Hydrochlorothiazide 25 Mg Tabs (Hydrochlorothiazide) .... 1/2 Tablet Daily 10)  Lorazepam 0.5 Mg Tabs (Lorazepam) .Marland Kitchen.. 1 Once Daily As Needed For Sleep 11)  Concerta 36 Mg Cr-Tabs (Methylphenidate Hcl) .... One By Mouth Q Daily  Allergies (verified): No Known Drug Allergies  Past History:  Family History: Last updated: 07/19/2006 Family History Hypertension Family History Other cancer-Pancreatic Family History of Cardiovascular disorder Fam hx CAD  Social History: Last updated: 07/19/2006 Former Smoker Alcohol use-yes Married  Risk Factors: Smoking Status: quit (06/10/2009) Passive Smoke Exposure: no (06/10/2009)  Past medical, surgical, family and social histories (including risk factors) reviewed, and no changes noted (except as noted below).  Past Medical History: Reviewed history from 05/19/2007 and no changes required. Current Problems:  FIBROIDS, UTERUS (ICD-218.9) ANXIETY (ICD-300.00) DEPRESSION (ICD-311) DIVERTICULOSIS, SIGMOID COLON (ICD-562.10) BLUNT HEAD TRAUMA (ICD-959.01) GERD (ICD-530.81) ASTHMA (ICD-493.90) ALLERGIC RHINITIS (ICD-477.9) PREMATURE VENTRICULAR CONTRACTIONS (ICD-427.69) IRRITABLE BOWEL SYNDROME (ICD-564.1) HYPERTENSION (ICD-401.9) Hyperlipidemia  Past Surgical History: Reviewed history from 07/19/2006 and no  changes required. Colonoscopy-02/05/2006 D&C Fibroid tumors- breast PMH-FH-SH reviewed for relevance  Family History: Reviewed history from 07/19/2006 and no changes required. Family History Hypertension Family History Other cancer-Pancreatic Family History of Cardiovascular disorder Fam hx CAD  Social History: Reviewed history from 07/19/2006 and no changes required. Former Smoker Alcohol use-yes Married  Review of Systems  The patient denies anorexia, fever, weight loss, weight gain, vision loss, decreased hearing, hoarseness, chest pain, syncope, dyspnea on exertion, peripheral edema, prolonged cough, headaches, hemoptysis, abdominal pain, melena, hematochezia, severe indigestion/heartburn, hematuria, incontinence, genital sores, muscle weakness, suspicious skin lesions, transient blindness, difficulty walking, depression, unusual weight change, abnormal bleeding, enlarged lymph nodes, angioedema, breast masses, and testicular masses.    Physical Exam  General:  uncomfortable but in no acute distresswell-developed.   Head:  normocephalic.   Eyes:  pupils equal and pupils round.   Ears:  R ear normal and L ear normal.   Nose:  External nasal examination shows no deformity or inflammation. Nasal mucosa are pink and moist without lesions or exudates. Mouth:  good dentition and pharynx pink and moist.   Neck:  supple.   Lungs:  normal respiratory effort and no wheezes.   Heart:  normal rate and regular rhythm.     Complete Medication List: 1)  Carvedilol 12.5 Mg Tabs (Carvedilol) .... Take two times a  day 2)  Symbicort 80-4.5 Mcg/act Aero (Budesonide-formoterol fumarate) .... Two puff by mouth two times a day as needed 3)  Vivelle-dot 0.0375 Mg/24hr Pttw (Estradiol) .... Chasnge 2 times a week 4)  Prometrium 200 Mg Caps (Progesterone micronized) .Marland Kitchen.. 1 once daily 5)  Synthroid 137 Mcg Tabs (Levothyroxine sodium) .... One by mouth daily 6)  Fexofenadine Hcl 180 Mg Tabs  (Fexofenadine hcl) .Marland Kitchen.. 1 once daily 7)  Amlodipine Besylate 10 Mg Tabs (Amlodipine besylate) .... Take one tablet once daily 8)  Crestor 20 Mg Tabs (Rosuvastatin calcium) .... One by mouth weekly 9)  Hydrochlorothiazide 25 Mg Tabs (Hydrochlorothiazide) .... 1/2 tablet daily 10)  Lorazepam 0.5 Mg Tabs (Lorazepam) .Marland Kitchen.. 1 once daily as needed for sleep 11)  Concerta 36 Mg Cr-tabs (Methylphenidate hcl) .... One by mouth q daily  Hypertension Assessment/Plan:      The patient's hypertensive risk group is category B: At least one risk factor (excluding diabetes) with no target organ damage.  Her calculated 10 year risk of coronary heart disease is 3 %.  Today's blood pressure is 118/78.  Her blood pressure goal is < 140/90.  Patient Instructions: 1)  Please schedule a follow-up appointment in 3 months. Prescriptions: SYNTHROID 137 MCG TABS (LEVOTHYROXINE SODIUM) one by mouth daily  #90 x 3   Entered and Authorized by:   Stacie Glaze MD   Signed by:   Stacie Glaze MD on 06/10/2009   Method used:   Electronically to        [facility] [address] [place] (retail)       [address] [place]       [place], [place]  [address]       Ph: [phone]       Fax: [phone]   RxID:   [id number] CONCERTA 36 MG CR-TABS (METHYLPHENIDATE HCL) one by mouth q daily  #30 x 0   Entered and Authorized by:   Stacie Glaze MD   Signed by:   Stacie Glaze MD on 06/10/2009   Method used:   Print then Give to Patient   RxID:   [id number] CONCERTA 36 MG CR-TABS (METHYLPHENIDATE HCL) one by mouth q daily  #30 x 0   Entered and Authorized by:   Stacie Glaze MD   Signed by:   Stacie Glaze MD on 06/10/2009   Method used:   Print then Give to Patient   RxID:   [id number] CONCERTA 36 MG CR-TABS (METHYLPHENIDATE HCL) one by mouth q daily  #30 x 0   Entered and Authorized by:   Stacie Glaze MD   Signed by:   Stacie Glaze MD on 06/10/2009   Method used:   Print then Give to Patient   RxID:    [id number]

## 2010-02-18 NOTE — Assessment & Plan Note (Signed)
Summary: cpx--no pap//ccm   Vital Signs:  Patient profile:   60 year old female Height:      69 inches Weight:      166 pounds BMI:     24.60 Temp:     98.2 degrees F rectal Pulse rate:   72 / minute Resp:     14 per minute BP sitting:   130 / 74  (left arm)  Vitals Entered By: Willy Eddy, LPN (December 09, 2009 3:05 PM) CC: cpx, Hypertension Management Is Patient Diabetic? No   Primary Care Provider:  Stacie Glaze MD  CC:  cpx and Hypertension Management.  History of Present Illness: feels wipped out on the exforge... good blood presure controll but feels tires concern with reaction with the concerta. Asthma stable HTN satble  Hypertension History:      She denies headache, chest pain, palpitations, dyspnea with exertion, orthopnea, PND, peripheral edema, visual symptoms, neurologic problems, syncope, and side effects from treatment.        Positive major cardiovascular risk factors include female age 50 years old or older, hyperlipidemia, and hypertension.  Negative major cardiovascular risk factors include negative family history for ischemic heart disease and non-tobacco-user status.     Preventive Screening-Counseling & Management  Alcohol-Tobacco     Smoking Status: quit     Year Quit: 1980     Passive Smoke Exposure: no     Tobacco Counseling: to remain off tobacco products  Problems Prior to Update: 1)  Attention Deficit Hyperactivity Disorder, Adult  (ICD-314.01) 2)  Pelvic Pain, Acute  (ICD-789.09) 3)  Hordeolum, Internal  (ICD-373.12) 4)  Edema  (ICD-782.3) 5)  Add  (ICD-314.00) 6)  Dyspnea On Exertion  (ICD-786.09) 7)  Hypertension  (ICD-401.9) 8)  Palpitations, Recurrent  (ICD-785.1) 9)  Premature Ventricular Contractions  (ICD-427.69) 10)  Unspecified Hypothyroidism  (ICD-244.9) 11)  Back Pain, Lumbar, With Radiculopathy  (ICD-724.4) 12)  Uns Advrs Eff Uns Rx Medicinal&biological Sbstnc  (ICD-995.20) 13)  Back Pain  (ICD-724.5) 14)   Preventive Health Care  (ICD-V70.0) 15)  Hyperlipidemia  (ICD-272.4) 16)  Fibroids, Uterus  (ICD-218.9) 17)  Anxiety  (ICD-300.00) 18)  Depression  (ICD-311) 19)  Diverticulosis, Sigmoid Colon  (ICD-562.10) 20)  Gerd  (ICD-530.81) 21)  Asthma  (ICD-493.90) 22)  Allergic Rhinitis  (ICD-477.9) 23)  Irritable Bowel Syndrome  (ICD-564.1)  Current Problems (verified): 1)  Attention Deficit Hyperactivity Disorder, Adult  (ICD-314.01) 2)  Pelvic Pain, Acute  (ICD-789.09) 3)  Hordeolum, Internal  (ICD-373.12) 4)  Edema  (ICD-782.3) 5)  Add  (ICD-314.00) 6)  Dyspnea On Exertion  (ICD-786.09) 7)  Hypertension  (ICD-401.9) 8)  Palpitations, Recurrent  (ICD-785.1) 9)  Premature Ventricular Contractions  (ICD-427.69) 10)  Unspecified Hypothyroidism  (ICD-244.9) 11)  Back Pain, Lumbar, With Radiculopathy  (ICD-724.4) 12)  Uns Advrs Eff Uns Rx Medicinal&biological Sbstnc  (ICD-995.20) 13)  Back Pain  (ICD-724.5) 14)  Preventive Health Care  (ICD-V70.0) 15)  Hyperlipidemia  (ICD-272.4) 16)  Fibroids, Uterus  (ICD-218.9) 17)  Anxiety  (ICD-300.00) 18)  Depression  (ICD-311) 19)  Diverticulosis, Sigmoid Colon  (ICD-562.10) 20)  Gerd  (ICD-530.81) 21)  Asthma  (ICD-493.90) 22)  Allergic Rhinitis  (ICD-477.9) 23)  Irritable Bowel Syndrome  (ICD-564.1)  Medications Prior to Update: 1)  Carvedilol 12.5 Mg Tabs (Carvedilol) .... One By Mouth Bid 2)  Symbicort 80-4.5 Mcg/act  Aero (Budesonide-Formoterol Fumarate) .... Two Puff By Mouth Two Times A Day As Needed 3)  Vivelle-Dot 0.0375 Mg/24hr Pttw (Estradiol) .Marland KitchenMarland KitchenMarland Kitchen  Chasnge 2 Times A Week 4)  Prometrium 200 Mg Caps (Progesterone Micronized) .Marland Kitchen.. 1 Once Daily 5)  Synthroid 137 Mcg Tabs (Levothyroxine Sodium) .... One By Mouth Daily 6)  Crestor 20 Mg Tabs (Rosuvastatin Calcium) .... One By Mouth Weekly 7)  Lorazepam 0.5 Mg Tabs (Lorazepam) .Marland Kitchen.. 1 Once Daily As Needed For Sleep 8)  Vyvanse 40 Mg Caps (Lisdexamfetamine Dimesylate) .... One By Mouth 9)   Exforge Hct 10-160-25 Mg Tabs (Amlodipine-Valsartan-Hctz) .... 1/2 By Mouth Daily  Current Medications (verified): 1)  Carvedilol 12.5 Mg Tabs (Carvedilol) .... One By Mouth Bid 2)  Symbicort 80-4.5 Mcg/act  Aero (Budesonide-Formoterol Fumarate) .... Two Puff By Mouth Two Times A Day As Needed 3)  Vivelle-Dot 0.0375 Mg/24hr Pttw (Estradiol) .... Chasnge 2 Times A Week 4)  Prometrium 200 Mg Caps (Progesterone Micronized) .Marland Kitchen.. 1 Once Daily 5)  Synthroid 137 Mcg Tabs (Levothyroxine Sodium) .... One By Mouth Daily 6)  Lorazepam 0.5 Mg Tabs (Lorazepam) .Marland Kitchen.. 1 Once Daily As Needed For Sleep 7)  Tribenzor 40-5-25 Mg Tabs (Olmesartan-Amlodipine-Hctz) .... 1/2 By Mouth Daily 8)  Vyvanse 50 Mg Caps (Lisdexamfetamine Dimesylate) .... One By Mouth Daily  Allergies (verified): No Known Drug Allergies  Past History:  Family History: Last updated: 07/19/2006 Family History Hypertension Family History Other cancer-Pancreatic Family History of Cardiovascular disorder Fam hx CAD  Social History: Last updated: 07/19/2006 Former Smoker Alcohol use-yes Married  Risk Factors: Smoking Status: quit (12/09/2009) Passive Smoke Exposure: no (12/09/2009)  Past medical, surgical, family and social histories (including risk factors) reviewed, and no changes noted (except as noted below).  Past Medical History: Reviewed history from 05/19/2007 and no changes required. Current Problems:  FIBROIDS, UTERUS (ICD-218.9) ANXIETY (ICD-300.00) DEPRESSION (ICD-311) DIVERTICULOSIS, SIGMOID COLON (ICD-562.10) BLUNT HEAD TRAUMA (ICD-959.01) GERD (ICD-530.81) ASTHMA (ICD-493.90) ALLERGIC RHINITIS (ICD-477.9) PREMATURE VENTRICULAR CONTRACTIONS (ICD-427.69) IRRITABLE BOWEL SYNDROME (ICD-564.1) HYPERTENSION (ICD-401.9) Hyperlipidemia  Past Surgical History: Reviewed history from 07/19/2006 and no changes required. Colonoscopy-02/05/2006 D&C Fibroid tumors- breast  Family History: Reviewed history from  07/19/2006 and no changes required. Family History Hypertension Family History Other cancer-Pancreatic Family History of Cardiovascular disorder Fam hx CAD  Social History: Reviewed history from 07/19/2006 and no changes required. Former Smoker Alcohol use-yes Married  Review of Systems  The patient denies anorexia, fever, weight loss, weight gain, vision loss, decreased hearing, hoarseness, chest pain, syncope, dyspnea on exertion, peripheral edema, prolonged cough, headaches, hemoptysis, abdominal pain, melena, hematochezia, severe indigestion/heartburn, hematuria, incontinence, genital sores, muscle weakness, suspicious skin lesions, transient blindness, difficulty walking, depression, unusual weight change, abnormal bleeding, enlarged lymph nodes, angioedema, and breast masses.    Physical Exam  General:  well-developed and well-nourished.   Head:  normocephalic.   Eyes:  pupils equal and pupils round.   Ears:  R ear normal and L ear normal.   Nose:  no nasal discharge.   Mouth:  pharynx pink and moist.   Neck:  supple.   Heart:  normal rate and regular rhythm.   Abdomen:  soft, normal bowel sounds, RLQ tenderness, and LUQ tenderness.   Msk:  normal ROM and no joint tenderness.   Pulses:  R and L carotid,radial,femoral,dorsalis pedis and posterior tibial pulses are full and equal bilaterally Extremities:  No clubbing, cyanosis, edema, or deformity noted with normal full range of motion of all joints.   Neurologic:  alert & oriented X3.     Impression & Recommendations:  Problem # 1:  PREVENTIVE HEALTH CARE (ICD-V70.0) Assessment Unchanged The pt was asked about all immunizations,  health maint. services that are appropriate to their age and was given guidance on diet exercize  and weight management  Mammogram: normal (08/16/2009) Pap smear: normal (01/18/2009) Colonoscopy: normal (01/19/2006) Td Booster: Historical (11/21/2003)   Flu Vax: Fluvax 3+ (10/11/2009)   Chol:  171 (12/02/2009)   HDL: 49.20 (12/02/2009)   LDL: 104 (12/02/2009)   TG: 89.0 (12/02/2009) TSH: 0.72 (12/02/2009)   Next mammogram due:: 08/2010 (12/09/2009) Next Colonoscopy due:: 01/2016 (04/05/2008)  Discussed using sunscreen, use of alcohol, drug use, self breast exam, routine dental care, routine eye care, schedule for GYN exam, routine physical exam, seat belts, multiple vitamins, osteoporosis prevention, adequate calcium intake in diet, recommendations for immunizations, mammograms and Pap smears.  Discussed exercise and checking cholesterol.  Discussed gun safety, safe sex, and contraception.  Problem # 2:  ADD (ICD-314.00) change medicaitons  Problem # 3:  HYPERTENSION (ICD-401.9)  Her updated medication list for this problem includes:    Carvedilol 12.5 Mg Tabs (Carvedilol) ..... One by mouth bid    Tribenzor 40-5-25 Mg Tabs (Olmesartan-amlodipine-hctz) .Marland Kitchen... 1/2 by mouth daily  BP today: 130/74 Prior BP: 124/78 (10/29/2009)  10 Yr Risk Heart Disease: 11 % Prior 10 Yr Risk Heart Disease: 5 % (10/11/2009)  Labs Reviewed: K+: 4.0 (12/02/2009) Creat: : 0.8 (12/02/2009)   Chol: 171 (12/02/2009)   HDL: 49.20 (12/02/2009)   LDL: 104 (12/02/2009)   TG: 89.0 (12/02/2009)  Complete Medication List: 1)  Carvedilol 12.5 Mg Tabs (Carvedilol) .... One by mouth bid 2)  Symbicort 80-4.5 Mcg/act Aero (Budesonide-formoterol fumarate) .... Two puff by mouth two times a day as needed 3)  Vivelle-dot 0.0375 Mg/24hr Pttw (Estradiol) .... Chasnge 2 times a week 4)  Prometrium 200 Mg Caps (Progesterone micronized) .Marland Kitchen.. 1 once daily 5)  Synthroid 137 Mcg Tabs (Levothyroxine sodium) .... One by mouth daily 6)  Lorazepam 0.5 Mg Tabs (Lorazepam) .Marland Kitchen.. 1 once daily as needed for sleep 7)  Tribenzor 40-5-25 Mg Tabs (Olmesartan-amlodipine-hctz) .... 1/2 by mouth daily 8)  Vyvanse 50 Mg Caps (Lisdexamfetamine dimesylate) .... One by mouth daily  Hypertension Assessment/Plan:      The patient's  hypertensive risk group is category B: At least one risk factor (excluding diabetes) with no target organ damage.  Her calculated 10 year risk of coronary heart disease is 11 %.  Today's blood pressure is 130/74.  Her blood pressure goal is < 140/90.  Patient Instructions: 1)  Please schedule a follow-up appointment in 2 months. Prescriptions: VYVANSE 50 MG CAPS (LISDEXAMFETAMINE DIMESYLATE) one by mouth daily  #30 x 0   Entered and Authorized by:   Stacie Glaze MD   Signed by:   Stacie Glaze MD on 12/09/2009   Method used:   Print then Give to Patient   RxID:   [id number] VYVANSE 50 MG CAPS (LISDEXAMFETAMINE DIMESYLATE) one by mouth daily  #30 x 0   Entered and Authorized by:   Stacie Glaze MD   Signed by:   Stacie Glaze MD on 12/09/2009   Method used:   Print then Give to Patient   RxID:   [id number]    Orders Added: 1)  Est. Patient 40-64 years [99396] 2)  Est. Patient Level III [59563]   Immunization History:  Tetanus/Td Immunization History:    Tetanus/Td:  historical (11/21/2003)   Immunization History:  Tetanus/Td Immunization History:    Tetanus/Td:  Historical (11/21/2003)   Preventive Care Screening  Mammogram:    Date:  08/16/2009    Next  Due:  08/2010    Results:  normal   Pap Smear:    Date:  01/18/2009    Next Due:  01/2010    Results:  normal

## 2010-02-18 NOTE — Progress Notes (Signed)
Summary: Concerta dosage?  Phone Note Call from Patient   Caller: Patient Call For: Stacie Glaze MD Reason for Call: Acute Illness, Talk to Nurse Summary of Call: Pt thinks the Concerta 36 mg is too strong for her.  She remembers it made her anxious.  Husband picked up prescription today, and she wants to know what can be done if Dr. Lovell Sheehan will lower the dosage>???  Do they have to drive back to [place] again today? [phone] Initial call taken by: Lynann Beaver CMA,  May 08, 2009 11:59 AM  Follow-up for Phone Call        the range for concerta is to 54 and the 36 is in the middle- give it a try.  Follow-up by: Willy Eddy, LPN,  May 08, 2009 2:50 PM  Additional Follow-up for Phone Call Additional follow up Details #1::        Phone Call Completed Additional Follow-up by: Raechel Ache, RN,  May 08, 2009 2:54 PM     Appended Document: Concerta dosage? per dr Lovell Sheehan- to give it a try

## 2010-02-18 NOTE — Progress Notes (Signed)
   Phone Note Outgoing Call   Call placed by: Duncan Dull, RN, BSN,  February 19, 2009 11:24 AM Call placed to: Patient Summary of Call: Called patient and left message on machine  To inform pt of lab results Duncan Dull, RN, BSN  February 19, 2009 11:24 AM   Follow-up for Phone Call        Pt aware of results Follow-up by: Duncan Dull, RN, BSN,  February 19, 2009 2:37 PM

## 2010-02-18 NOTE — Assessment & Plan Note (Signed)
Summary: BP MED CONCERNS // RS   Vital Signs:  Patient profile:   60 year old female Height:      69 inches Weight:      166 pounds BMI:     24.60 Temp:     98.2 degrees F oral Pulse rate:   72 / minute Resp:     14 per minute BP sitting:   130 / 80  (left arm)  Vitals Entered By: Willy Eddy, LPN (October 11, 2009 12:19 PM) CC: roa- stopped dexilent and exforge due to abd pain, but continues to have pain since stopping, Hypertension Management Is Patient Diabetic? No   Primary Care Provider:  Stacie Glaze MD  CC:  roa- stopped dexilent and exforge due to abd pain, but continues to have pain since stopping, and Hypertension Management.  History of Present Illness: Hsa not felt well fro several stopped the exforge and resumed the amlodepine, corge and HTCZ The pts has nausea, light headed and low stomach pains with loose stools stopped the exforge has  one episode of severe dizzyness ( one episode) and recurrent lightheadednesss the increased coreg is related to these symptom  Has noted lower adominal pain and some dark stools  Colonoscopy 2008 normal no vaginal discharge some urine clouding , no sense of urgency  Hypertension History:      She denies headache, chest pain, palpitations, dyspnea with exertion, orthopnea, PND, peripheral edema, visual symptoms, neurologic problems, syncope, and side effects from treatment.        Positive major cardiovascular risk factors include female age 53 years old or older, hyperlipidemia, and hypertension.  Negative major cardiovascular risk factors include negative family history for ischemic heart disease and non-tobacco-user status.     Preventive Screening-Counseling & Management  Alcohol-Tobacco     Smoking Status: quit     Year Quit: 1980     Passive Smoke Exposure: no  Problems Prior to Update: 1)  Pelvic Pain, Acute  (ICD-789.09) 2)  Hordeolum, Internal  (ICD-373.12) 3)  Edema  (ICD-782.3) 4)  Add   (ICD-314.00) 5)  Dyspnea On Exertion  (ICD-786.09) 6)  Hypertension  (ICD-401.9) 7)  Palpitations, Recurrent  (ICD-785.1) 8)  Premature Ventricular Contractions  (ICD-427.69) 9)  Unspecified Hypothyroidism  (ICD-244.9) 10)  Back Pain, Lumbar, With Radiculopathy  (ICD-724.4) 11)  Uns Advrs Eff Uns Rx Medicinal&biological Sbstnc  (ICD-995.20) 12)  Back Pain  (ICD-724.5) 13)  Preventive Health Care  (ICD-V70.0) 14)  Hyperlipidemia  (ICD-272.4) 15)  Fibroids, Uterus  (ICD-218.9) 16)  Anxiety  (ICD-300.00) 17)  Depression  (ICD-311) 18)  Diverticulosis, Sigmoid Colon  (ICD-562.10) 19)  Gerd  (ICD-530.81) 20)  Asthma  (ICD-493.90) 21)  Allergic Rhinitis  (ICD-477.9) 22)  Irritable Bowel Syndrome  (ICD-564.1)  Current Problems (verified): 1)  Hordeolum, Internal  (ICD-373.12) 2)  Edema  (ICD-782.3) 3)  Add  (ICD-314.00) 4)  Dyspnea On Exertion  (ICD-786.09) 5)  Hypertension  (ICD-401.9) 6)  Palpitations, Recurrent  (ICD-785.1) 7)  Premature Ventricular Contractions  (ICD-427.69) 8)  Unspecified Hypothyroidism  (ICD-244.9) 9)  Back Pain, Lumbar, With Radiculopathy  (ICD-724.4) 10)  Uns Advrs Eff Uns Rx Medicinal&biological Sbstnc  (ICD-995.20) 11)  Back Pain  (ICD-724.5) 12)  Preventive Health Care  (ICD-V70.0) 13)  Hyperlipidemia  (ICD-272.4) 14)  Fibroids, Uterus  (ICD-218.9) 15)  Anxiety  (ICD-300.00) 16)  Depression  (ICD-311) 17)  Diverticulosis, Sigmoid Colon  (ICD-562.10) 18)  Gerd  (ICD-530.81) 19)  Asthma  (ICD-493.90) 20)  Allergic Rhinitis  (  ICD-477.9) 21)  Irritable Bowel Syndrome  (ICD-564.1)  Medications Prior to Update: 1)  Carvedilol 12.5 Mg Tabs (Carvedilol) .... Take 25 Mg in The Am and 12.5 Mg in Pm. 2)  Symbicort 80-4.5 Mcg/act  Aero (Budesonide-Formoterol Fumarate) .... Two Puff By Mouth Two Times A Day As Needed 3)  Vivelle-Dot 0.0375 Mg/24hr Pttw (Estradiol) .... Chasnge 2 Times A Week 4)  Prometrium 200 Mg Caps (Progesterone Micronized) .Marland Kitchen.. 1 Once  Daily 5)  Synthroid 137 Mcg Tabs (Levothyroxine Sodium) .... One By Mouth Daily 6)  Crestor 20 Mg Tabs (Rosuvastatin Calcium) .... One By Mouth Weekly 7)  Lorazepam 0.5 Mg Tabs (Lorazepam) .Marland Kitchen.. 1 Once Daily As Needed For Sleep 8)  Concerta 36 Mg Cr-Tabs (Methylphenidate Hcl) .... One By Mouth Q Daily 9)  Amlodipine Besylate 5 Mg Tabs (Amlodipine Besylate) .... One By Mouth Daily 10)  Hydrochlorothiazide 12.5 Mg Caps (Hydrochlorothiazide) .... One By Mouth Daily  Current Medications (verified): 1)  Carvedilol 12.5 Mg Tabs (Carvedilol) .... One By Mouth Bid 2)  Symbicort 80-4.5 Mcg/act  Aero (Budesonide-Formoterol Fumarate) .... Two Puff By Mouth Two Times A Day As Needed 3)  Vivelle-Dot 0.0375 Mg/24hr Pttw (Estradiol) .... Chasnge 2 Times A Week 4)  Prometrium 200 Mg Caps (Progesterone Micronized) .Marland Kitchen.. 1 Once Daily 5)  Synthroid 137 Mcg Tabs (Levothyroxine Sodium) .... One By Mouth Daily 6)  Crestor 20 Mg Tabs (Rosuvastatin Calcium) .... One By Mouth Weekly 7)  Lorazepam 0.5 Mg Tabs (Lorazepam) .Marland Kitchen.. 1 Once Daily As Needed For Sleep 8)  Concerta 36 Mg Cr-Tabs (Methylphenidate Hcl) .... One By Mouth Q Daily 9)  Tribenzor 20-5-12.5 Mg Tabs (Olmesartan-Amlodipine-Hctz) .... 1/2 By Mouth Daily in Am  Allergies (verified): No Known Drug Allergies  Past History:  Family History: Last updated: 07/19/2006 Family History Hypertension Family History Other cancer-Pancreatic Family History of Cardiovascular disorder Fam hx CAD  Social History: Last updated: 07/19/2006 Former Smoker Alcohol use-yes Married  Risk Factors: Smoking Status: quit (10/11/2009) Passive Smoke Exposure: no (10/11/2009)  Past medical, surgical, family and social histories (including risk factors) reviewed, and no changes noted (except as noted below).  Past Medical History: Reviewed history from 05/19/2007 and no changes required. Current Problems:  FIBROIDS, UTERUS (ICD-218.9) ANXIETY (ICD-300.00) DEPRESSION  (ICD-311) DIVERTICULOSIS, SIGMOID COLON (ICD-562.10) BLUNT HEAD TRAUMA (ICD-959.01) GERD (ICD-530.81) ASTHMA (ICD-493.90) ALLERGIC RHINITIS (ICD-477.9) PREMATURE VENTRICULAR CONTRACTIONS (ICD-427.69) IRRITABLE BOWEL SYNDROME (ICD-564.1) HYPERTENSION (ICD-401.9) Hyperlipidemia  Past Surgical History: Reviewed history from 07/19/2006 and no changes required. Colonoscopy-02/05/2006 D&C Fibroid tumors- breast  Family History: Reviewed history from 07/19/2006 and no changes required. Family History Hypertension Family History Other cancer-Pancreatic Family History of Cardiovascular disorder Fam hx CAD  Social History: Reviewed history from 07/19/2006 and no changes required. Former Smoker Alcohol use-yes Married  Review of Systems  The patient denies anorexia, fever, weight loss, weight gain, vision loss, decreased hearing, hoarseness, chest pain, syncope, dyspnea on exertion, peripheral edema, prolonged cough, headaches, hemoptysis, abdominal pain, hematochezia, severe indigestion/heartburn, hematuria, incontinence, genital sores, muscle weakness, suspicious skin lesions, transient blindness, difficulty walking, depression, unusual weight change, abnormal bleeding, enlarged lymph nodes, angioedema, and breast masses.         Flu Vaccine Consent Questions     Do you have a history of severe allergic reactions to this vaccine? no    Any prior history of allergic reactions to egg and/or gelatin? no    Do you have a sensitivity to the preservative Thimersol? no    Do you have a past history of Guillan-Barre  Syndrome? no    Do you currently have an acute febrile illness? no    Have you ever had a severe reaction to latex? no    Vaccine information given and explained to patient? yes    Are you currently pregnant? no    Lot Number:[id number]   Exp Date:07/19/2010   Site Given  Left Deltoid IM   Physical Exam  General:  uncomfortable but in no acute distresswell-developed.     Head:  normocephalic.   Eyes:  pupils equal and pupils round.   Ears:  R ear normal and L ear normal.   Nose:  no external deformity and no nasal discharge.   Mouth:  pharynx pink and moist.   Neck:  supple.   Lungs:  normal respiratory effort and no wheezes.   Heart:  normal rate and regular rhythm.   Abdomen:  soft, normal bowel sounds, RLQ tenderness, and LUQ tenderness.   Msk:  normal ROM and no joint tenderness.   Extremities:  No clubbing, cyanosis, edema, or deformity noted with normal full range of motion of all joints.   Neurologic:  alert & oriented X3.     Impression & Recommendations:  Problem # 1:  PELVIC PAIN, ACUTE (ICD-789.09) Assessment New  Discussed use of medications, application of heat or cold, and exercises.   Orders: UA Dipstick w/o Micro (automated)  (81003) Radiology Referral (Radiology) Venipuncture (16109) Specimen Handling (60454) TLB-CBC Platelet - w/Differential (85025-CBCD) TLB-CEA (Carcinoembryonic Antigen) (82378-CEA) fro trasnvaginla pelvic US  Problem # 2:  FIBROIDS, UTERUS (ICD-218.9) Assessment: Deteriorated as above  Problem # 3:  HYPERTENSION (ICD-401.9)  The following medications were removed from the medication list:    Hydrochlorothiazide 12.5 Mg Caps (Hydrochlorothiazide) ..... One by mouth daily Her updated medication list for this problem includes:    Carvedilol 12.5 Mg Tabs (Carvedilol) ..... One by mouth bid    Tribenzor 20-5-12.5 Mg Tabs (Olmesartan-amlodipine-hctz) .Marland Kitchen... 1/2 by mouth daily in am  Orders: TLB-BMP (Basic Metabolic Panel-BMET) (80048-METABOL)  BP today: 130/80 Prior BP: 116/76 (09/10/2009)  10 Yr Risk Heart Disease: 5 % Prior 10 Yr Risk Heart Disease: 3 % (06/10/2009)  Labs Reviewed: K+: 4.4 (09/10/2009) Creat: : 0.7 (09/10/2009)   Chol: 159 (04/08/2009)   HDL: 60.80 (04/08/2009)   LDL: 80 (04/08/2009)   TG: 91.0 (04/08/2009)  Problem # 4:  GERD (ICD-530.81) CBC and stool cards Labs  Reviewed: Hgb: 14.3 (04/27/2007)   Hct: 42.5 (04/27/2007)  Complete Medication List: 1)  Carvedilol 12.5 Mg Tabs (Carvedilol) .... One by mouth bid 2)  Symbicort 80-4.5 Mcg/act Aero (Budesonide-formoterol fumarate) .... Two puff by mouth two times a day as needed 3)  Vivelle-dot 0.0375 Mg/24hr Pttw (Estradiol) .... Chasnge 2 times a week 4)  Prometrium 200 Mg Caps (Progesterone micronized) .Marland Kitchen.. 1 once daily 5)  Synthroid 137 Mcg Tabs (Levothyroxine sodium) .... One by mouth daily 6)  Crestor 20 Mg Tabs (Rosuvastatin calcium) .... One by mouth weekly 7)  Lorazepam 0.5 Mg Tabs (Lorazepam) .Marland Kitchen.. 1 once daily as needed for sleep 8)  Concerta 36 Mg Cr-tabs (Methylphenidate hcl) .... One by mouth q daily 9)  Tribenzor 20-5-12.5 Mg Tabs (Olmesartan-amlodipine-hctz) .... 1/2 by mouth daily in am  Other Orders: Admin 1st Vaccine (09811) Flu Vaccine 44yrs + [phone])  Hypertension Assessment/Plan:      The patient's hypertensive risk group is category B: At least one risk factor (excluding diabetes) with no target organ damage.  Her calculated 10 year risk of coronary heart disease is  5 %.  Today's blood pressure is 130/80.  Her blood pressure goal is < 140/90.  Patient Instructions: 1)  Please schedule a follow-up appointment in 3 weeks. use SDA or 415 OK    Prevention & Chronic Care Immunizations   Influenza vaccine: Fluvax 3+  (10/11/2009)    Tetanus booster: Not documented    Pneumococcal vaccine: Not documented    H. zoster vaccine: Not documented  Colorectal Screening   Hemoccult: Not documented    Colonoscopy: normal  (01/19/2006)   Colonoscopy due: 01/2016  Other Screening   Pap smear: normal  (05/20/2007)   Pap smear due: 05/2008    Mammogram: Not documented    DXA bone density scan: Not documented   Smoking status: quit  (10/11/2009)  Lipids   Total Cholesterol: 159  (04/08/2009)   LDL: 80  (04/08/2009)   LDL Direct: 174.6  (01/08/2009)   HDL: 60.80   (04/08/2009)   Triglycerides: 91.0  (04/08/2009)    SGOT (AST): 23  (04/08/2009)   SGPT (ALT): 24  (04/08/2009)   Alkaline phosphatase: 45  (04/08/2009)   Total bilirubin: 0.6  (04/08/2009)  Hypertension   Last Blood Pressure: 130 / 80  (10/11/2009)   Serum creatinine: 0.7  (09/10/2009)   Serum potassium 4.4  (09/10/2009)  Self-Management Support :    Hypertension self-management support: Not documented    Lipid self-management support: Not documented   Laboratory Results   Urine Tests  Date/Time Recieved: October 11, 2009 4:37 PM  Date/Time Reported: October 11, 2009 4:37 PM   Routine Urinalysis   Color: yellow Appearance: Clear Glucose: negative   (Normal Range: Negative) Bilirubin: negative   (Normal Range: Negative) Ketone: negative   (Normal Range: Negative) Spec. Gravity: 1.015   (Normal Range: 1.003-1.035) Blood: negative   (Normal Range: Negative) pH: 7.0   (Normal Range: 5.0-8.0) Protein: negative   (Normal Range: Negative) Urobilinogen: 0.2   (Normal Range: 0-1) Nitrite: negative   (Normal Range: Negative) Leukocyte Esterace: negative   (Normal Range: Negative)    Comments: Wynona Canes, CMA  October 11, 2009 4:37 PM

## 2010-02-18 NOTE — Progress Notes (Signed)
Summary: increase amlodipine to 10 mg  Medications Added AMLODIPINE BESYLATE 10 MG TABS (AMLODIPINE BESYLATE) take one tablet once daily       Phone Note Call from Patient Call back at Home Phone [phone]   Refills Requested: Medication #1:   5 MG TABS take once daily Caller: Patient Reason for Call: Talk to Nurse Details for Reason: Per pt calling, meds increase AMLODIPINE BESYLATE need to be calling in 10 mg to [facility] in [place] [phone] Initial call taken by: Lorne Skeens,  March 28, 2009 10:43 AM  Follow-up for Phone Call        Per Graciela Husbands follow up note, pt to increase Amlodipine to 10 mg daily.  Will send new script to [facility] in [facility]. Follow-up by: Judithe Modest CMA,  March 28, 2009 11:54 AM    New/Updated Medications: AMLODIPINE BESYLATE 10 MG TABS (AMLODIPINE BESYLATE) take one tablet once daily Prescriptions: AMLODIPINE BESYLATE 10 MG TABS (AMLODIPINE BESYLATE) take one tablet once daily  #30 x 6   Entered by:   Judithe Modest CMA   Authorized by:   Nathen May, MD, [facility]   Signed by:   Judithe Modest CMA on 03/28/2009   Method used:   Electronically to        [facility] [address] [place] (retail)       [address] [place]       [place], [place]  [address]       Ph: [phone]       Fax: [phone]   RxID:   [id number]

## 2010-02-18 NOTE — Progress Notes (Signed)
Summary: wants to go back on Concerta  Phone Note Call from Patient   Caller: Patient Call For: Stacie Glaze MD Reason for Call: Acute Illness Summary of Call: Since changing to Vyvance, pt is complaining of headache, SOB and irregular heart rate. [phone] [facility]) Initial call taken by: Lynann Beaver CMA,  May 07, 2009 9:43 AM  Follow-up for Phone Call        per dr Hervey Ard- may have concerta 36 1 once daily  Follow-up by: Willy Eddy, LPN,  May 07, 2009 12:00 PM    New/Updated Medications: CONCERTA 36 MG CR-TABS (METHYLPHENIDATE HCL) one by mouth q daily CONCERTA 36 MG CR-TABS (METHYLPHENIDATE HCL) one by mouth daily Prescriptions: CONCERTA 36 MG CR-TABS (METHYLPHENIDATE HCL) one by mouth daily  #30 x 0   Entered by:   Lynann Beaver CMA   Authorized by:   Stacie Glaze MD   Signed by:   Lynann Beaver CMA on 05/07/2009   Method used:   Print then Give to Patient   RxID:   [id number] CONCERTA 36 MG CR-TABS (METHYLPHENIDATE HCL) one by mouth q daily  #30 x 0   Entered by:   Lynann Beaver CMA   Authorized by:   Stacie Glaze MD   Signed by:   Lynann Beaver CMA on 05/07/2009   Method used:   Print then Give to Patient   RxID:   [id number]  Left message to pick up prescriptions.

## 2010-02-18 NOTE — Assessment & Plan Note (Signed)
Summary: 3 mo rov/mm   Vital Signs:  Patient profile:   60 year old female Height:      69 inches Weight:      170 pounds BMI:     25.20 Temp:     98.1 degrees F oral Pulse rate:   72 / minute Resp:     14 per minute BP sitting:   120 / 70  (left arm)  Vitals Entered By: Willy Eddy, LPN (April 15, 2009 1:59 PM) CC: roa labs   CC:  roa labs.  Preventive Screening-Counseling & Management  Alcohol-Tobacco     Smoking Status: quit     Year Quit: 1980     Passive Smoke Exposure: no  Current Problems (verified): 1)  Dyspnea On Exertion  (ICD-786.09) 2)  Hypertension  (ICD-401.9) 3)  Palpitations, Recurrent  (ICD-785.1) 4)  Premature Ventricular Contractions  (ICD-427.69) 5)  Unspecified Hypothyroidism  (ICD-244.9) 6)  Back Pain, Lumbar, With Radiculopathy  (ICD-724.4) 7)  Uns Advrs Eff Uns Rx Medicinal&biological Sbstnc  (ICD-995.20) 8)  Back Pain  (ICD-724.5) 9)  Preventive Health Care  (ICD-V70.0) 10)  Hyperlipidemia  (ICD-272.4) 11)  Fibroids, Uterus  (ICD-218.9) 12)  Anxiety  (ICD-300.00) 13)  Depression  (ICD-311) 14)  Diverticulosis, Sigmoid Colon  (ICD-562.10) 15)  Gerd  (ICD-530.81) 16)  Asthma  (ICD-493.90) 17)  Allergic Rhinitis  (ICD-477.9) 18)  Irritable Bowel Syndrome  (ICD-564.1)  Current Medications (verified): 1)  Carvedilol 12.5 Mg Tabs (Carvedilol) .... Take Two Times A Day 2)  Symbicort 80-4.5 Mcg/act  Aero (Budesonide-Formoterol Fumarate) .... Two Puff By Mouth Two Times A Day As Needed 3)  Vivelle-Dot 0.025 Mg/24hr Pttw (Estradiol) .... Change 2 Times A Week 4)  Prometrium 200 Mg Caps (Progesterone Micronized) .Marland Kitchen.. 1 Once Daily 5)  Synthroid 137 Mcg Tabs (Levothyroxine Sodium) .... One By Mouth Daily 6)  Fexofenadine Hcl 180 Mg Tabs (Fexofenadine Hcl) .Marland Kitchen.. 1 Once Daily 7)  Amlodipine Besylate 10 Mg Tabs (Amlodipine Besylate) .... Take One Tablet Once Daily 8)  Crestor 20 Mg Tabs (Rosuvastatin Calcium) .... One By Mouth Weekly 9)   Hydrochlorothiazide 25 Mg Tabs (Hydrochlorothiazide) .... 1/2 Tablet Daily 10)  Concerta 18 Mg Cr-Tabs (Methylphenidate Hcl) .... One By Mouth Daily For 1 Week The Two By Mouth For One Week The Three By Mouth For One Week 11)  Lorazepam 0.5 Mg Tabs (Lorazepam) .Marland Kitchen.. 1 Once Daily As Needed  Allergies (verified): No Known Drug Allergies  Past History:  Family History: Last updated: 07/19/2006 Family History Hypertension Family History Other cancer-Pancreatic Family History of Cardiovascular disorder Fam hx CAD  Social History: Last updated: 07/19/2006 Former Smoker Alcohol use-yes Married  Risk Factors: Smoking Status: quit (04/15/2009) Passive Smoke Exposure: no (04/15/2009)  Past medical, surgical, family and social histories (including risk factors) reviewed, and no changes noted (except as noted below).  Past Medical History: Reviewed history from 05/19/2007 and no changes required. Current Problems:  FIBROIDS, UTERUS (ICD-218.9) ANXIETY (ICD-300.00) DEPRESSION (ICD-311) DIVERTICULOSIS, SIGMOID COLON (ICD-562.10) BLUNT HEAD TRAUMA (ICD-959.01) GERD (ICD-530.81) ASTHMA (ICD-493.90) ALLERGIC RHINITIS (ICD-477.9) PREMATURE VENTRICULAR CONTRACTIONS (ICD-427.69) IRRITABLE BOWEL SYNDROME (ICD-564.1) HYPERTENSION (ICD-401.9) Hyperlipidemia  Past Surgical History: Reviewed history from 07/19/2006 and no changes required. Colonoscopy-02/05/2006 D&C Fibroid tumors- breast  Family History: Reviewed history from 07/19/2006 and no changes required. Family History Hypertension Family History Other cancer-Pancreatic Family History of Cardiovascular disorder Fam hx CAD  Social History: Reviewed history from 07/19/2006 and no changes required. Former Smoker Alcohol use-yes Married  Review of Systems  The patient denies anorexia, fever, weight loss, weight gain, vision loss, decreased hearing, hoarseness, chest pain, syncope, dyspnea on exertion, peripheral edema,  prolonged cough, headaches, hemoptysis, abdominal pain, melena, hematochezia, severe indigestion/heartburn, hematuria, incontinence, genital sores, muscle weakness, suspicious skin lesions, transient blindness, difficulty walking, depression, unusual weight change, abnormal bleeding, enlarged lymph nodes, and angioedema.    Physical Exam  General:  uncomfortable but in no acute distresswell-developed.   Head:  normocephalic.   Ears:  R ear normal and L ear normal.   Nose:  External nasal examination shows no deformity or inflammation. Nasal mucosa are pink and moist without lesions or exudates. Mouth:  good dentition and pharynx pink and moist.   Neck:  supple.   Lungs:  normal respiratory effort and no wheezes.   Heart:  normal rate and regular rhythm.   Abdomen:  Bowel sounds positive,abdomen soft and non-tender without masses, organomegaly or hernias noted. Msk:  there is slight tenderness in the left lumbar region.  Muscles were perhaps slightly tighter; straight leg test was negative.  Neurovascular structures intact Pulses:  R and L carotid,radial,femoral,dorsalis pedis and posterior tibial pulses are full and equal bilaterally Extremities:  No clubbing, cyanosis, edema, or deformity noted with normal full range of motion of all joints.     Impression & Recommendations:  Problem # 1:  HYPERTENSION (ICD-401.9)  Her updated medication list for this problem includes:    Carvedilol 12.5 Mg Tabs (Carvedilol) .Marland Kitchen... Take two times a day    Amlodipine Besylate 10 Mg Tabs (Amlodipine besylate) .Marland Kitchen... Take one tablet once daily    Hydrochlorothiazide 25 Mg Tabs (Hydrochlorothiazide) .Marland Kitchen... 1/2 tablet daily  BP today: 120/70 Prior BP: 124/76 (03/25/2009)  Prior 10 Yr Risk Heart Disease: 8 % (02/14/2009)  Labs Reviewed: K+: 3.9 (02/14/2009) Creat: : 0.8 (02/14/2009)   Chol: 159 (04/08/2009)   HDL: 60.80 (04/08/2009)   LDL: 80 (04/08/2009)   TG: 91.0 (04/08/2009)  Problem # 2:   HYPERLIPIDEMIA (ICD-272.4) following  the [facility] Her updated medication list for this problem includes:    Crestor 20 Mg Tabs (Rosuvastatin calcium) ..... One by mouth weekly  Labs Reviewed: SGOT: 23 (04/08/2009)   SGPT: 24 (04/08/2009)  Lipid Goals: Chol Goal: 200 (05/19/2007)   HDL Goal: 40 (05/19/2007)   LDL Goal: 130 (05/19/2007)   TG Goal: 150 (05/19/2007)  Prior 10 Yr Risk Heart Disease: 8 % (02/14/2009)   HDL:60.80 (04/08/2009), 44.50 (01/08/2009)  LDL:80 (04/08/2009), 111 (14/78/2956)  Chol:159 (04/08/2009), 251 (01/08/2009)  Trig:91.0 (04/08/2009), 173.0 (01/08/2009)  Problem # 3:  ANXIETY (ICD-300.00) improved off the welbutrin last friday The following medications were removed from the medication list:    Buspirone Hcl 15 Mg Tabs (Buspirone hcl) ..... One twice a day stopped    Wellbutrin Xl 300 Mg Xr24h-tab (Bupropion hcl) .Marland Kitchen... 1 once daily -name brand only  stopped Her updated medication list for this problem includes:    Lorazepam 0.5 Mg Tabs (Lorazepam) .Marland Kitchen... 1 once daily as needed  Discussed medication use and relaxation techniques.   Problem # 4:  ADD (ICD-314.00) concerta with caffiene  Complete Medication List: 1)  Carvedilol 12.5 Mg Tabs (Carvedilol) .... Take two times a day 2)  Symbicort 80-4.5 Mcg/act Aero (Budesonide-formoterol fumarate) .... Two puff by mouth two times a day as needed 3)  Vivelle-dot 0.025 Mg/24hr Pttw (Estradiol) .... Change 2 times a week 4)  Prometrium 200 Mg Caps (Progesterone micronized) .Marland Kitchen.. 1 once daily 5)  Synthroid 137 Mcg Tabs (Levothyroxine sodium) .... One  by mouth daily 6)  Fexofenadine Hcl 180 Mg Tabs (Fexofenadine hcl) .Marland Kitchen.. 1 once daily 7)  Amlodipine Besylate 10 Mg Tabs (Amlodipine besylate) .... Take one tablet once daily 8)  Crestor 20 Mg Tabs (Rosuvastatin calcium) .... One by mouth weekly 9)  Hydrochlorothiazide 25 Mg Tabs (Hydrochlorothiazide) .... 1/2 tablet daily 10)  Vyvanse 50 Mg Caps (Lisdexamfetamine  dimesylate) .... One by mouth daily 11)  Lorazepam 0.5 Mg Tabs (Lorazepam) .Marland Kitchen.. 1 once daily as needed  Patient Instructions: 1)  Please schedule a follow-up appointment in 2 months. Prescriptions: VYVANSE 50 MG CAPS (LISDEXAMFETAMINE DIMESYLATE) one by mouth daily  #30 x 0   Entered and Authorized by:   Stacie Glaze MD   Signed by:   Stacie Glaze MD on 04/15/2009   Method used:   Print then Give to Patient   RxID:   [id number] VYVANSE 50 MG CAPS (LISDEXAMFETAMINE DIMESYLATE) one by mouth daily  #30 x 0   Entered and Authorized by:   Stacie Glaze MD   Signed by:   Stacie Glaze MD on 04/15/2009   Method used:   Print then Give to Patient   RxID:   [id number]

## 2010-02-18 NOTE — Progress Notes (Signed)
Summary: med questions.  Phone Note Call from Patient   Caller: Patient Call For: Stacie Glaze MD Summary of Call: K level given to pt. Pt has been takng Dexilant and is having nausea, abdominal pain, loose stools, difficulty with loose stools and vision disturbance. Exforge HCT was stopped, and also  Dexilant.   she is taking Amlodipine 5mg   and HCTZ 12.5 mg and BP is normal.  Still having low abdomen pain, and headache .  Wants to contiue this and see if all the symptoms go away.??? [phone]   Initial call taken by: Lynann Beaver CMA,  September 24, 2009 9:15 AM  Follow-up for Phone Call        ok  [facility] Debby [facility] CMA  September 24, 2009 1:54 PM  Follow-up by: Birdie Sons MD,  September 24, 2009 1:34 PM  Additional Follow-up for Phone Call Additional follow up Details #1::        Pt notified. Additional Follow-up by: Lynann Beaver CMA,  September 24, 2009 2:12 PM    New/Updated Medications: AMLODIPINE BESYLATE 5 MG TABS (AMLODIPINE BESYLATE) one by mouth daily HYDROCHLOROTHIAZIDE 12.5 MG CAPS (HYDROCHLOROTHIAZIDE) one by mouth daily

## 2010-02-18 NOTE — Progress Notes (Signed)
Summary:  side effect from coreg   Phone Note Call from Patient Call back at Home Phone [phone] Call back at [phone]   Caller: Patient Reason for Call: Talk to Nurse Details for Reason: Per pt calling, pt was told to call back with info regarding medication , c/o dizziness, irregular heart beat, tired, coreg was increase 18.75 twice. two week ago today, ob/gyn took pt off her homornes meds. inform pt that i would send improtant message to his nurse.  Initial call taken by: Lorne Skeens,  March 12, 2009 3:34 PM  Follow-up for Phone Call        S/w pt and Dr. Graciela Husbands had seen her during a treadmill on 1/27. He had advised her to increase her Coreg to 18.75 two times a day d/t HTN during treadmill. She forgot to do that until the first week in Feb. Last week she became extremely fatigued and has been dizzy. I ask her to take her 12.5mg  dose (as before the increase) tonight and in the morning and I will s/w Dr. Graciela Husbands tomorrow to see what he would like for her to do from there. Her BP has remained 140's/ 80's on the increase and her HR is 57-64. Dr. Graciela Husbands had also mentioned to her maybe increasing her Amlodipine. Pt understands and agrees with plan.  Follow-up by: Duncan Dull, RN, BSN,  March 12, 2009 4:31 PM  Additional Follow-up for Phone Call Additional follow up Details #1::        Per Dr. Graciela Husbands, stay at 12.5mg  two times a day dose of Carvedilol. After about 2 weeks of the change, if her BP is still high she can increase her Amlodipine to 10mg  daily. Called patient and left message on machine to discuss this plan with her. Duncan Dull, RN, BSN  March 13, 2009 8:29 AM      Additional Follow-up for Phone Call Additional follow up Details #2::    Pt aware of and agrees with plan. Follow-up by: Duncan Dull, RN, BSN,  March 13, 2009 11:21 AM

## 2010-02-18 NOTE — Progress Notes (Signed)
Summary: cough  Phone Note Call from Patient   Caller: Patient Call For: Stacie Glaze MD Summary of Call: starting taking Atuss 2 days ago for croupy cough & congestion- making her BP go up and irregular pulse. Can she have something else? [facility] her phone [phone] Initial call taken by: Raechel Ache, RN,  March 08, 2009 4:44 PM  Follow-up for Phone Call        try otc delsym or musinex dm per dr Lovell Sheehan- take as directed Follow-up by: Willy Eddy, LPN,  March 08, 2009 4:47 PM  Additional Follow-up for Phone Call Additional follow up Details #1::        Phone Call Completed Additional Follow-up by: Raechel Ache, RN,  March 08, 2009 4:49 PM

## 2010-02-18 NOTE — Assessment & Plan Note (Signed)
Summary: edema//ccm   Vital Signs:  Patient profile:   60 year old female Height:      69 inches Weight:      168 pounds BMI:     24.90 Temp:     98.2 degrees F oral Pulse rate:   72 / minute Resp:     14 per minute BP sitting:   116 / 72  (left arm)  Vitals Entered By: Willy Eddy, LPN (August 12, 2009 10:47 AM) CC: c/o edematous feet primarily in afternoon, Hypertension Management Is Patient Diabetic? No   CC:  c/o edematous feet primarily in afternoon and Hypertension Management.  History of Present Illness: Pt presented for follow up has been noting swelling in the ankles which is new and has detected pitting edema med effect vs venous distension  she is on amlodipine this was increased to 10 in march 2011 the pt as not been back on an ace since she developed the cough she tried cozaar . and  the avapro  other medicaitons are reviewed  complicated path  Hypertension History:      She denies headache, chest pain, palpitations, dyspnea with exertion, orthopnea, PND, peripheral edema, visual symptoms, neurologic problems, syncope, and side effects from treatment.        Positive major cardiovascular risk factors include female age 59 years old or older, hyperlipidemia, and hypertension.  Negative major cardiovascular risk factors include negative family history for ischemic heart disease and non-tobacco-user status.     Preventive Screening-Counseling & Management  Alcohol-Tobacco     Smoking Status: quit     Year Quit: 1980     Passive Smoke Exposure: no  Problems Prior to Update: 1)  Add  (ICD-314.00) 2)  Dyspnea On Exertion  (ICD-786.09) 3)  Hypertension  (ICD-401.9) 4)  Palpitations, Recurrent  (ICD-785.1) 5)  Premature Ventricular Contractions  (ICD-427.69) 6)  Unspecified Hypothyroidism  (ICD-244.9) 7)  Back Pain, Lumbar, With Radiculopathy  (ICD-724.4) 8)  Uns Advrs Eff Uns Rx Medicinal&biological Sbstnc  (ICD-995.20) 9)  Back Pain  (ICD-724.5) 10)   Preventive Health Care  (ICD-V70.0) 11)  Hyperlipidemia  (ICD-272.4) 12)  Fibroids, Uterus  (ICD-218.9) 13)  Anxiety  (ICD-300.00) 14)  Depression  (ICD-311) 15)  Diverticulosis, Sigmoid Colon  (ICD-562.10) 16)  Gerd  (ICD-530.81) 17)  Asthma  (ICD-493.90) 18)  Allergic Rhinitis  (ICD-477.9) 19)  Irritable Bowel Syndrome  (ICD-564.1)  Current Problems (verified): 1)  Add  (ICD-314.00) 2)  Dyspnea On Exertion  (ICD-786.09) 3)  Hypertension  (ICD-401.9) 4)  Palpitations, Recurrent  (ICD-785.1) 5)  Premature Ventricular Contractions  (ICD-427.69) 6)  Unspecified Hypothyroidism  (ICD-244.9) 7)  Back Pain, Lumbar, With Radiculopathy  (ICD-724.4) 8)  Uns Advrs Eff Uns Rx Medicinal&biological Sbstnc  (ICD-995.20) 9)  Back Pain  (ICD-724.5) 10)  Preventive Health Care  (ICD-V70.0) 11)  Hyperlipidemia  (ICD-272.4) 12)  Fibroids, Uterus  (ICD-218.9) 13)  Anxiety  (ICD-300.00) 14)  Depression  (ICD-311) 15)  Diverticulosis, Sigmoid Colon  (ICD-562.10) 16)  Gerd  (ICD-530.81) 17)  Asthma  (ICD-493.90) 18)  Allergic Rhinitis  (ICD-477.9) 19)  Irritable Bowel Syndrome  (ICD-564.1)  Medications Prior to Update: 1)  Carvedilol 12.5 Mg Tabs (Carvedilol) .... Take Two Times A Day 2)  Symbicort 80-4.5 Mcg/act  Aero (Budesonide-Formoterol Fumarate) .... Two Puff By Mouth Two Times A Day As Needed 3)  Vivelle-Dot 0.0375 Mg/24hr Pttw (Estradiol) .... Chasnge 2 Times A Week 4)  Prometrium 200 Mg Caps (Progesterone Micronized) .Marland Kitchen.. 1 Once Daily 5)  Synthroid 137 Mcg Tabs (Levothyroxine Sodium) .... One By Mouth Daily 6)  Fexofenadine Hcl 180 Mg Tabs (Fexofenadine Hcl) .Marland Kitchen.. 1 Once Daily 7)  Amlodipine Besylate 10 Mg Tabs (Amlodipine Besylate) .... Take One Tablet Once Daily 8)  Crestor 20 Mg Tabs (Rosuvastatin Calcium) .... One By Mouth Weekly 9)  Hydrochlorothiazide 25 Mg Tabs (Hydrochlorothiazide) .... 1/2 Tablet Daily 10)  Lorazepam 0.5 Mg Tabs (Lorazepam) .Marland Kitchen.. 1 Once Daily As Needed For  Sleep 11)  Concerta 36 Mg Cr-Tabs (Methylphenidate Hcl) .... One By Mouth Q Daily  Current Medications (verified): 1)  Carvedilol 12.5 Mg Tabs (Carvedilol) .... Take Two Times A Day 2)  Symbicort 80-4.5 Mcg/act  Aero (Budesonide-Formoterol Fumarate) .... Two Puff By Mouth Two Times A Day As Needed 3)  Vivelle-Dot 0.0375 Mg/24hr Pttw (Estradiol) .... Chasnge 2 Times A Week 4)  Prometrium 200 Mg Caps (Progesterone Micronized) .Marland Kitchen.. 1 Once Daily 5)  Synthroid 137 Mcg Tabs (Levothyroxine Sodium) .... One By Mouth Daily 6)  Fexofenadine Hcl 180 Mg Tabs (Fexofenadine Hcl) .Marland Kitchen.. 1 Once Daily 7)  Amlodipine Besylate 10 Mg Tabs (Amlodipine Besylate) .... Take One Tablet Once Daily 8)  Crestor 20 Mg Tabs (Rosuvastatin Calcium) .... One By Mouth Weekly 9)  Hydrochlorothiazide 25 Mg Tabs (Hydrochlorothiazide) .... 1/2 Tablet Daily 10)  Lorazepam 0.5 Mg Tabs (Lorazepam) .Marland Kitchen.. 1 Once Daily As Needed For Sleep 11)  Concerta 36 Mg Cr-Tabs (Methylphenidate Hcl) .... One By Mouth Q Daily  Allergies (verified): No Known Drug Allergies  Past History:  Family History: Last updated: 07/19/2006 Family History Hypertension Family History Other cancer-Pancreatic Family History of Cardiovascular disorder Fam hx CAD  Social History: Last updated: 07/19/2006 Former Smoker Alcohol use-yes Married  Risk Factors: Smoking Status: quit (08/12/2009) Passive Smoke Exposure: no (08/12/2009)  Past medical, surgical, family and social histories (including risk factors) reviewed, and no changes noted (except as noted below).  Past Medical History: Reviewed history from 05/19/2007 and no changes required. Current Problems:  FIBROIDS, UTERUS (ICD-218.9) ANXIETY (ICD-300.00) DEPRESSION (ICD-311) DIVERTICULOSIS, SIGMOID COLON (ICD-562.10) BLUNT HEAD TRAUMA (ICD-959.01) GERD (ICD-530.81) ASTHMA (ICD-493.90) ALLERGIC RHINITIS (ICD-477.9) PREMATURE VENTRICULAR CONTRACTIONS (ICD-427.69) IRRITABLE BOWEL SYNDROME  (ICD-564.1) HYPERTENSION (ICD-401.9) Hyperlipidemia  Past Surgical History: Reviewed history from 07/19/2006 and no changes required. Colonoscopy-02/05/2006 D&C Fibroid tumors- breast  Family History: Reviewed history from 07/19/2006 and no changes required. Family History Hypertension Family History Other cancer-Pancreatic Family History of Cardiovascular disorder Fam hx CAD  Social History: Reviewed history from 07/19/2006 and no changes required. Former Smoker Alcohol use-yes Married  Review of Systems  The patient denies anorexia, fever, weight loss, weight gain, vision loss, decreased hearing, hoarseness, chest pain, syncope, dyspnea on exertion, peripheral edema, prolonged cough, headaches, hemoptysis, abdominal pain, melena, hematochezia, severe indigestion/heartburn, hematuria, incontinence, genital sores, muscle weakness, suspicious skin lesions, transient blindness, difficulty walking, depression, unusual weight change, abnormal bleeding, enlarged lymph nodes, angioedema, and breast masses.    Physical Exam  General:  uncomfortable but in no acute distresswell-developed.   Head:  normocephalic.   Eyes:  pupils equal and pupils round.   Ears:  R ear normal and L ear normal.   Nose:  External nasal examination shows no deformity or inflammation. Nasal mucosa are pink and moist without lesions or exudates. Mouth:  good dentition and pharynx pink and moist.   Neck:  supple.   Lungs:  normal respiratory effort and no wheezes.   Heart:  normal rate and regular rhythm.   Abdomen:  Bowel sounds positive,abdomen soft and non-tender without masses,  organomegaly or hernias noted.   Impression & Recommendations:  Problem # 1:  HYPERTENSION (ICD-401.9) the pt has been on diuretics in the past with ARB and not aces ( due to cough on ace docummneted) The following medications were removed from the medication list:    Amlodipine Besylate 10 Mg Tabs (Amlodipine besylate) .Marland Kitchen...  Take one tablet once daily    Hydrochlorothiazide 25 Mg Tabs (Hydrochlorothiazide) .Marland Kitchen... 1/2 tablet daily Her updated medication list for this problem includes:    Carvedilol 12.5 Mg Tabs (Carvedilol) .Marland Kitchen... Take two times a day    Exforge Hct 10-320-25 Mg Tabs (Amlodipine-valsartan-hctz) .Marland Kitchen... 1/2 by mouth daily complicated change of medicaitons due th expecttiosn and side effects  Problem # 2:  ANXIETY (ICD-300.00)  Her updated medication list for this problem includes:    Lorazepam 0.5 Mg Tabs (Lorazepam) .Marland Kitchen... 1 once daily as needed for sleep  Discussed medication use and relaxation techniques.   Complete Medication List: 1)  Carvedilol 12.5 Mg Tabs (Carvedilol) .... Take two times a day 2)  Symbicort 80-4.5 Mcg/act Aero (Budesonide-formoterol fumarate) .... Two puff by mouth two times a day as needed 3)  Vivelle-dot 0.0375 Mg/24hr Pttw (Estradiol) .... Chasnge 2 times a week 4)  Prometrium 200 Mg Caps (Progesterone micronized) .Marland Kitchen.. 1 once daily 5)  Synthroid 137 Mcg Tabs (Levothyroxine sodium) .... One by mouth daily 6)  Fexofenadine Hcl 180 Mg Tabs (Fexofenadine hcl) .Marland Kitchen.. 1 once daily 7)  Crestor 20 Mg Tabs (Rosuvastatin calcium) .... One by mouth weekly 8)  Lorazepam 0.5 Mg Tabs (Lorazepam) .Marland Kitchen.. 1 once daily as needed for sleep 9)  Concerta 36 Mg Cr-tabs (Methylphenidate hcl) .... One by mouth q daily 10)  Exforge Hct 10-320-25 Mg Tabs (Amlodipine-valsartan-hctz) .... 1/2 by mouth daily  Hypertension Assessment/Plan:      The patient's hypertensive risk group is category B: At least one risk factor (excluding diabetes) with no target organ damage.  Her calculated 10 year risk of coronary heart disease is 3 %.  Today's blood pressure is 116/72.  Her blood pressure goal is < 140/90.  Patient Instructions: 1)  keep appointment

## 2010-02-18 NOTE — Assessment & Plan Note (Signed)
Summary: 3 week fup//ccm   Vital Signs:  Patient profile:   60 year old female Height:      69 inches Weight:      168 pounds BMI:     24.90 Temp:     98.2 degrees F oral Pulse rate:   72 / minute Pulse rhythm:   regular Resp:     14 per minute BP sitting:   124 / 78  (left arm)  Vitals Entered By: Willy Eddy, LPN (October 29, 2009 12:31 PM) CC: roa after changing to tribenzor Is Patient Diabetic? No   Primary Care Taralyn Ferraiolo:  Stacie Glaze MD  CC:  roa after changing to tribenzor.  History of Present Illness: The nausea has receeded but still has low pelvc tenderness the results of the [place] were discussd she awakes with a HA the pts recent change to tribensor for blood pressure control may paly a roll in the increased HA she has also change her HRT recently  Preventive Screening-Counseling & Management  Alcohol-Tobacco     Smoking Status: quit     Year Quit: 1980     Passive Smoke Exposure: no     Tobacco Counseling: to remain off tobacco products  Problems Prior to Update: 1)  Attention Deficit Hyperactivity Disorder, Adult  (ICD-314.01) 2)  Pelvic Pain, Acute  (ICD-789.09) 3)  Hordeolum, Internal  (ICD-373.12) 4)  Edema  (ICD-782.3) 5)  Add  (ICD-314.00) 6)  Dyspnea On Exertion  (ICD-786.09) 7)  Hypertension  (ICD-401.9) 8)  Palpitations, Recurrent  (ICD-785.1) 9)  Premature Ventricular Contractions  (ICD-427.69) 10)  Unspecified Hypothyroidism  (ICD-244.9) 11)  Back Pain, Lumbar, With Radiculopathy  (ICD-724.4) 12)  Uns Advrs Eff Uns Rx Medicinal&biological Sbstnc  (ICD-995.20) 13)  Back Pain  (ICD-724.5) 14)  Preventive Health Care  (ICD-V70.0) 15)  Hyperlipidemia  (ICD-272.4) 16)  Fibroids, Uterus  (ICD-218.9) 17)  Anxiety  (ICD-300.00) 18)  Depression  (ICD-311) 19)  Diverticulosis, Sigmoid Colon  (ICD-562.10) 20)  Gerd  (ICD-530.81) 21)  Asthma  (ICD-493.90) 22)  Allergic Rhinitis  (ICD-477.9) 23)  Irritable Bowel Syndrome   (ICD-564.1)  Current Problems (verified): 1)  Pelvic Pain, Acute  (ICD-789.09) 2)  Hordeolum, Internal  (ICD-373.12) 3)  Edema  (ICD-782.3) 4)  Add  (ICD-314.00) 5)  Dyspnea On Exertion  (ICD-786.09) 6)  Hypertension  (ICD-401.9) 7)  Palpitations, Recurrent  (ICD-785.1) 8)  Premature Ventricular Contractions  (ICD-427.69) 9)  Unspecified Hypothyroidism  (ICD-244.9) 10)  Back Pain, Lumbar, With Radiculopathy  (ICD-724.4) 11)  Uns Advrs Eff Uns Rx Medicinal&biological Sbstnc  (ICD-995.20) 12)  Back Pain  (ICD-724.5) 13)  Preventive Health Care  (ICD-V70.0) 14)  Hyperlipidemia  (ICD-272.4) 15)  Fibroids, Uterus  (ICD-218.9) 16)  Anxiety  (ICD-300.00) 17)  Depression  (ICD-311) 18)  Diverticulosis, Sigmoid Colon  (ICD-562.10) 19)  Gerd  (ICD-530.81) 20)  Asthma  (ICD-493.90) 21)  Allergic Rhinitis  (ICD-477.9) 22)  Irritable Bowel Syndrome  (ICD-564.1)  Medications Prior to Update: 1)  Carvedilol 12.5 Mg Tabs (Carvedilol) .... One By Mouth Bid 2)  Symbicort 80-4.5 Mcg/act  Aero (Budesonide-Formoterol Fumarate) .... Two Puff By Mouth Two Times A Day As Needed 3)  Vivelle-Dot 0.0375 Mg/24hr Pttw (Estradiol) .... Chasnge 2 Times A Week 4)  Prometrium 200 Mg Caps (Progesterone Micronized) .Marland Kitchen.. 1 Once Daily 5)  Synthroid 137 Mcg Tabs (Levothyroxine Sodium) .... One By Mouth Daily 6)  Crestor 20 Mg Tabs (Rosuvastatin Calcium) .... One By Mouth Weekly 7)  Lorazepam 0.5 Mg Tabs (  Lorazepam) .Marland Kitchen.. 1 Once Daily As Needed For Sleep 8)  Concerta 36 Mg Cr-Tabs (Methylphenidate Hcl) .... One By Mouth Q Daily 9)  Tribenzor 20-5-12.5 Mg Tabs (Olmesartan-Amlodipine-Hctz) .... 1/2 By Mouth Daily in Am  Current Medications (verified): 1)  Carvedilol 12.5 Mg Tabs (Carvedilol) .... One By Mouth Bid 2)  Symbicort 80-4.5 Mcg/act  Aero (Budesonide-Formoterol Fumarate) .... Two Puff By Mouth Two Times A Day As Needed 3)  Vivelle-Dot 0.0375 Mg/24hr Pttw (Estradiol) .... Chasnge 2 Times A Week 4)  Prometrium  200 Mg Caps (Progesterone Micronized) .Marland Kitchen.. 1 Once Daily 5)  Synthroid 137 Mcg Tabs (Levothyroxine Sodium) .... One By Mouth Daily 6)  Crestor 20 Mg Tabs (Rosuvastatin Calcium) .... One By Mouth Weekly 7)  Lorazepam 0.5 Mg Tabs (Lorazepam) .Marland Kitchen.. 1 Once Daily As Needed For Sleep 8)  Vyvanse 40 Mg Caps (Lisdexamfetamine Dimesylate) .... One By Mouth 9)  Exforge Hct 10-160-25 Mg Tabs (Amlodipine-Valsartan-Hctz) .... 1/2 By Mouth Daily  Allergies (verified): No Known Drug Allergies  Past History:  Family History: Last updated: 07/19/2006 Family History Hypertension Family History Other cancer-Pancreatic Family History of Cardiovascular disorder Fam hx CAD  Social History: Last updated: 07/19/2006 Former Smoker Alcohol use-yes Married  Risk Factors: Smoking Status: quit (10/29/2009) Passive Smoke Exposure: no (10/29/2009)  Past medical, surgical, family and social histories (including risk factors) reviewed, and no changes noted (except as noted below).  Past Medical History: Reviewed history from 05/19/2007 and no changes required. Current Problems:  FIBROIDS, UTERUS (ICD-218.9) ANXIETY (ICD-300.00) DEPRESSION (ICD-311) DIVERTICULOSIS, SIGMOID COLON (ICD-562.10) BLUNT HEAD TRAUMA (ICD-959.01) GERD (ICD-530.81) ASTHMA (ICD-493.90) ALLERGIC RHINITIS (ICD-477.9) PREMATURE VENTRICULAR CONTRACTIONS (ICD-427.69) IRRITABLE BOWEL SYNDROME (ICD-564.1) HYPERTENSION (ICD-401.9) Hyperlipidemia  Past Surgical History: Reviewed history from 07/19/2006 and no changes required. Colonoscopy-02/05/2006 D&C Fibroid tumors- breast  Family History: Reviewed history from 07/19/2006 and no changes required. Family History Hypertension Family History Other cancer-Pancreatic Family History of Cardiovascular disorder Fam hx CAD  Social History: Reviewed history from 07/19/2006 and no changes required. Former Smoker Alcohol use-yes Married  Review of Systems  The patient denies  anorexia, fever, weight loss, weight gain, vision loss, decreased hearing, hoarseness, chest pain, syncope, dyspnea on exertion, peripheral edema, prolonged cough, headaches, hemoptysis, abdominal pain, melena, hematochezia, severe indigestion/heartburn, hematuria, incontinence, genital sores, muscle weakness, suspicious skin lesions, transient blindness, difficulty walking, depression, unusual weight change, abnormal bleeding, enlarged lymph nodes, angioedema, and breast masses.    Physical Exam  General:  well-developed and well-nourished.   Head:  normocephalic.   Eyes:  pupils equal and pupils round.   Ears:  R ear normal and L ear normal.   Nose:  no nasal discharge.   Neck:  supple.   Lungs:  normal respiratory effort and no wheezes.   Heart:  normal rate and regular rhythm.   Abdomen:  soft, normal bowel sounds, RLQ tenderness, and LUQ tenderness.   Msk:  normal ROM and no joint tenderness.   Extremities:  No clubbing, cyanosis, edema, or deformity noted with normal full range of motion of all joints.   Neurologic:  alert & oriented X3.     Impression & Recommendations:  Problem # 1:  HYPERTENSION (ICD-401.9) Assessment Unchanged the PVC have increased with the change to tribenzor ( we will resume the prior medications and moniter) she has a h a normal cath Her updated medication list for this problem includes:    Carvedilol 12.5 Mg Tabs (Carvedilol) ..... One by mouth bid    Exforge Hct 10-160-25 Mg Tabs (Amlodipine-valsartan-hctz) .Marland KitchenMarland KitchenMarland KitchenMarland Kitchen  1/2 by mouth daily  BP today: 124/78 Prior BP: 130/80 (10/11/2009)  Prior 10 Yr Risk Heart Disease: 5 % (10/11/2009)  Labs Reviewed: K+: 4.0 (10/11/2009) Creat: : 0.7 (10/11/2009)   Chol: 159 (04/08/2009)   HDL: 60.80 (04/08/2009)   LDL: 80 (04/08/2009)   TG: 91.0 (04/08/2009)  Problem # 2:  PELVIC PAIN, ACUTE (ICD-789.09) Assessment: Deteriorated the pain is lessened and the US reveals grwoth in her fibroids she will discusss this  with her OB GYN Discussed use of medications, application of heat or cold, and exercises.   Problem # 3:  ATTENTION DEFICIT HYPERACTIVITY DISORDER, ADULT (ICD-314.01) vyance trial with the  Problem # 4:  HYPERTENSION (ICD-401.9)  Her updated medication list for this problem includes:    Carvedilol 12.5 Mg Tabs (Carvedilol) ..... One by mouth bid    Exforge Hct 10-160-25 Mg Tabs (Amlodipine-valsartan-hctz) .Marland Kitchen... 1/2 by mouth daily  BP today: 124/78 Prior BP: 130/80 (10/11/2009)  Prior 10 Yr Risk Heart Disease: 5 % (10/11/2009)  Labs Reviewed: K+: 4.0 (10/11/2009) Creat: : 0.7 (10/11/2009)   Chol: 159 (04/08/2009)   HDL: 60.80 (04/08/2009)   LDL: 80 (04/08/2009)   TG: 91.0 (04/08/2009)  Complete Medication List: 1)  Carvedilol 12.5 Mg Tabs (Carvedilol) .... One by mouth bid 2)  Symbicort 80-4.5 Mcg/act Aero (Budesonide-formoterol fumarate) .... Two puff by mouth two times a day as needed 3)  Vivelle-dot 0.0375 Mg/24hr Pttw (Estradiol) .... Chasnge 2 times a week 4)  Prometrium 200 Mg Caps (Progesterone micronized) .Marland Kitchen.. 1 once daily 5)  Synthroid 137 Mcg Tabs (Levothyroxine sodium) .... One by mouth daily 6)  Crestor 20 Mg Tabs (Rosuvastatin calcium) .... One by mouth weekly 7)  Lorazepam 0.5 Mg Tabs (Lorazepam) .Marland Kitchen.. 1 once daily as needed for sleep 8)  Vyvanse 40 Mg Caps (Lisdexamfetamine dimesylate) .... One by mouth 9)  Exforge Hct 10-160-25 Mg Tabs (Amlodipine-valsartan-hctz) .... 1/2 by mouth daily  Patient Instructions: 1)  Please schedule a follow-up appointment  keep CPX Prescriptions: VYVANSE 40 MG CAPS (LISDEXAMFETAMINE DIMESYLATE) one by mouth  #30 x 0   Entered and Authorized by:   Stacie Glaze MD   Signed by:   Stacie Glaze MD on 10/29/2009   Method used:   Print then Give to Patient   RxID:   [id number] VYVANSE 40 MG CAPS (LISDEXAMFETAMINE DIMESYLATE) one by mouth  #30 x 0   Entered and Authorized by:   Stacie Glaze MD   Signed by:   Stacie Glaze MD  on 10/29/2009   Method used:   Print then Give to Patient   RxID:   [id number]

## 2010-02-18 NOTE — Assessment & Plan Note (Signed)
Summary: consult re: anxiety/med check/cjr   Vital Signs:  Patient profile:   60 year old female Height:      69 inches Weight:      174 pounds BMI:     25.79 Temp:     98.2 degrees F oral Pulse rate:   72 / minute Resp:     14 per minute BP sitting:   124 / 76  (left arm)  Vitals Entered By: Willy Eddy, LPN (March 26, 6211 12:14 PM) CC: roa   CC:  roa.  History of Present Illness: 6-9 months ago felt jitterly and cold saw OBGYN increased the vivelle and the pt had increased cramps [place]  at gyn to check fibroids they decided to stop the HRT when she called the gyn she went back on the HRT the pt took the ativan in the AM and functioned  very stressfull with the remodeling of  her house  Preventive Screening-Counseling & Management  Alcohol-Tobacco     Smoking Status: quit     Year Quit: 1980     Passive Smoke Exposure: no  Problems Prior to Update: 1)  Dyspnea On Exertion  (ICD-786.09) 2)  Hypertension  (ICD-401.9) 3)  Palpitations, Recurrent  (ICD-785.1) 4)  Premature Ventricular Contractions  (ICD-427.69) 5)  Unspecified Hypothyroidism  (ICD-244.9) 6)  Back Pain, Lumbar, With Radiculopathy  (ICD-724.4) 7)  Uns Advrs Eff Uns Rx Medicinal&biological Sbstnc  (ICD-995.20) 8)  Back Pain  (ICD-724.5) 9)  Preventive Health Care  (ICD-V70.0) 10)  Hyperlipidemia  (ICD-272.4) 11)  Fibroids, Uterus  (ICD-218.9) 12)  Anxiety  (ICD-300.00) 13)  Depression  (ICD-311) 14)  Diverticulosis, Sigmoid Colon  (ICD-562.10) 15)  Gerd  (ICD-530.81) 16)  Asthma  (ICD-493.90) 17)  Allergic Rhinitis  (ICD-477.9) 18)  Irritable Bowel Syndrome  (ICD-564.1)  Medications Prior to Update: 1)  Buspirone Hcl 15 Mg Tabs (Buspirone Hcl) .... One Twice A Day 2)  Baby Aspirin 81 Mg  Chew (Aspirin) .... Once Daily 3)  Carvedilol 12.5 Mg Tabs (Carvedilol) .... Take Two Times A Day 4)  Symbicort 80-4.5 Mcg/act  Aero (Budesonide-Formoterol Fumarate) .... Two Puff By Mouth Two Times A Day  As Needed 5)  Vivelle-Dot 0.0375 Mg/24hr Pttw (Estradiol) .... Change 2 Times A Week 6)  Prometrium 200 Mg Caps (Progesterone Micronized) .Marland Kitchen.. 1 Once Daily 7)  Wellbutrin Xl 300 Mg Xr24h-Tab (Bupropion Hcl) .Marland Kitchen.. 1 Once Daily -Name Brand Only 8)  Synthroid 137 Mcg Tabs (Levothyroxine Sodium) .... One By Mouth Daily 9)  Fexofenadine Hcl 180 Mg Tabs (Fexofenadine Hcl) .Marland Kitchen.. 1 Once Daily 10)  Amlodipine Besylate 5 Mg Tabs (Amlodipine Besylate) .... Take Once Daily 11)  Crestor 20 Mg Tabs (Rosuvastatin Calcium) .... One By Mouth Weekly 12)  Hydrochlorothiazide 25 Mg Tabs (Hydrochlorothiazide) .... 1/2 Tablet Daily  Current Medications (verified): 1)  Buspirone Hcl 15 Mg Tabs (Buspirone Hcl) .... One Twice A Day 2)  Baby Aspirin 81 Mg  Chew (Aspirin) .... Once Daily 3)  Carvedilol 12.5 Mg Tabs (Carvedilol) .... Take Two Times A Day 4)  Symbicort 80-4.5 Mcg/act  Aero (Budesonide-Formoterol Fumarate) .... Two Puff By Mouth Two Times A Day As Needed 5)  Vivelle-Dot 0.025 Mg/24hr Pttw (Estradiol) .... Change 2 Times A Week 6)  Prometrium 200 Mg Caps (Progesterone Micronized) .Marland Kitchen.. 1 Once Daily 7)  Wellbutrin Xl 300 Mg Xr24h-Tab (Bupropion Hcl) .Marland Kitchen.. 1 Once Daily -Name Brand Only 8)  Synthroid 137 Mcg Tabs (Levothyroxine Sodium) .... One By Mouth Daily 9)  Fexofenadine  Hcl 180 Mg Tabs (Fexofenadine Hcl) .Marland Kitchen.. 1 Once Daily 10)  Amlodipine Besylate 5 Mg Tabs (Amlodipine Besylate) .... Take Once Daily 11)  Crestor 20 Mg Tabs (Rosuvastatin Calcium) .... One By Mouth Weekly 12)  Hydrochlorothiazide 25 Mg Tabs (Hydrochlorothiazide) .... 1/2 Tablet Daily  Allergies (verified): No Known Drug Allergies  Past History:  Family History: Last updated: 07/19/2006 Family History Hypertension Family History Other cancer-Pancreatic Family History of Cardiovascular disorder Fam hx CAD  Social History: Last updated: 07/19/2006 Former Smoker Alcohol use-yes Married  Risk Factors: Smoking Status: quit  (03/25/2009) Passive Smoke Exposure: no (03/25/2009)  Past medical, surgical, family and social histories (including risk factors) reviewed, and no changes noted (except as noted below).  Past Medical History: Reviewed history from 05/19/2007 and no changes required. Current Problems:  FIBROIDS, UTERUS (ICD-218.9) ANXIETY (ICD-300.00) DEPRESSION (ICD-311) DIVERTICULOSIS, SIGMOID COLON (ICD-562.10) BLUNT HEAD TRAUMA (ICD-959.01) GERD (ICD-530.81) ASTHMA (ICD-493.90) ALLERGIC RHINITIS (ICD-477.9) PREMATURE VENTRICULAR CONTRACTIONS (ICD-427.69) IRRITABLE BOWEL SYNDROME (ICD-564.1) HYPERTENSION (ICD-401.9) Hyperlipidemia  Past Surgical History: Reviewed history from 07/19/2006 and no changes required. Colonoscopy-02/05/2006 D&C Fibroid tumors- breast  Family History: Reviewed history from 07/19/2006 and no changes required. Family History Hypertension Family History Other cancer-Pancreatic Family History of Cardiovascular disorder Fam hx CAD  Social History: Reviewed history from 07/19/2006 and no changes required. Former Smoker Alcohol use-yes Married  Review of Systems  The patient denies anorexia, fever, weight loss, weight gain, vision loss, decreased hearing, hoarseness, chest pain, syncope, dyspnea on exertion, peripheral edema, prolonged cough, headaches, hemoptysis, abdominal pain, melena, hematochezia, severe indigestion/heartburn, hematuria, incontinence, genital sores, muscle weakness, suspicious skin lesions, transient blindness, difficulty walking, depression, unusual weight change, abnormal bleeding, enlarged lymph nodes, angioedema, and breast masses.    Physical Exam  General:  uncomfortable but in no acute distresswell-developed.   Head:  normocephalic.   Eyes:  pupils equal and pupils round.   Ears:  R ear normal and L ear normal.   Nose:  External nasal examination shows no deformity or inflammation. Nasal mucosa are pink and moist without lesions or  exudates. Mouth:  good dentition and pharynx pink and moist.   Neck:  supple.   Lungs:  normal respiratory effort and no wheezes.   Heart:  normal rate and regular rhythm.   Abdomen:  Bowel sounds positive,abdomen soft and non-tender without masses, organomegaly or hernias noted. Msk:  there is slight tenderness in the left lumbar region.  Muscles were perhaps slightly tighter; straight leg test was negative.  Neurovascular structures intact Pulses:  R and L carotid,radial,femoral,dorsalis pedis and posterior tibial pulses are full and equal bilaterally Extremities:  No clubbing, cyanosis, edema, or deformity noted with normal full range of motion of all joints.     Impression & Recommendations:  Problem # 1:  HYPERTENSION (ICD-401.9)  Her updated medication list for this problem includes:    Carvedilol 12.5 Mg Tabs (Carvedilol) .Marland Kitchen... Take two times a day    Amlodipine Besylate 5 Mg Tabs (Amlodipine besylate) .Marland Kitchen... Take once daily    Hydrochlorothiazide 25 Mg Tabs (Hydrochlorothiazide) .Marland Kitchen... 1/2 tablet daily  BP today: 124/76 Prior BP: 115/75 (02/14/2009)  Prior 10 Yr Risk Heart Disease: 8 % (02/14/2009)  Labs Reviewed: K+: 3.9 (02/14/2009) Creat: : 0.8 (02/14/2009)   Chol: 251 (01/08/2009)   HDL: 44.50 (01/08/2009)   LDL: 111 (06/29/2008)   TG: 173.0 (01/08/2009)  Problem # 2:  ANXIETY (ICD-300.00)  Her updated medication list for this problem includes:    Buspirone Hcl 15 Mg Tabs (Buspirone hcl) .Marland KitchenMarland KitchenMarland KitchenMarland Kitchen  One twice a day    Wellbutrin Xl 300 Mg Xr24h-tab (Bupropion hcl) .Marland Kitchen... 1 once daily -name brand only  Discussed medication use and relaxation techniques.   Problem # 3:  DEPRESSION (ICD-311)  Her updated medication list for this problem includes:    Buspirone Hcl 15 Mg Tabs (Buspirone hcl) ..... One twice a day    Wellbutrin Xl 300 Mg Xr24h-tab (Bupropion hcl) .Marland Kitchen... 1 once daily -name brand only  Complete Medication List: 1)  Buspirone Hcl 15 Mg Tabs (Buspirone hcl) ....  One twice a day 2)  Baby Aspirin 81 Mg Chew (Aspirin) .... Once daily 3)  Carvedilol 12.5 Mg Tabs (Carvedilol) .... Take two times a day 4)  Symbicort 80-4.5 Mcg/act Aero (Budesonide-formoterol fumarate) .... Two puff by mouth two times a day as needed 5)  Vivelle-dot 0.025 Mg/24hr Pttw (Estradiol) .... Change 2 times a week 6)  Prometrium 200 Mg Caps (Progesterone micronized) .Marland Kitchen.. 1 once daily 7)  Wellbutrin Xl 300 Mg Xr24h-tab (Bupropion hcl) .Marland Kitchen.. 1 once daily -name brand only 8)  Synthroid 137 Mcg Tabs (Levothyroxine sodium) .... One by mouth daily 9)  Fexofenadine Hcl 180 Mg Tabs (Fexofenadine hcl) .Marland Kitchen.. 1 once daily 10)  Amlodipine Besylate 5 Mg Tabs (Amlodipine besylate) .... Take once daily 11)  Crestor 20 Mg Tabs (Rosuvastatin calcium) .... One by mouth weekly 12)  Hydrochlorothiazide 25 Mg Tabs (Hydrochlorothiazide) .... 1/2 tablet daily 13)  Concerta 18 Mg Cr-tabs (Methylphenidate hcl) .... One by mouth daily for 1 week the two by mouth for one week the three by mouth for one week  Patient Instructions: 1)  keep on the welbutrin for 1 weeks then go to every other day for one week the off 2)  stop the buspar  and begin concerta as prescribed 3)  have a friend to [facility] the effect of the concerta and stop if you become paranoid or increased anxiety Prescriptions: CONCERTA 18 MG CR-TABS (METHYLPHENIDATE HCL) one by mouth daily for 1 week the two by mouth for one week the three by mouth for one week  #42 x 0   Entered and Authorized by:   Stacie Glaze MD   Signed by:   Stacie Glaze MD on 03/25/2009   Method used:   Print then Give to Patient   RxID:   [id number]

## 2010-02-20 NOTE — Progress Notes (Signed)
Summary: REQUEST FOR RETURN CALL  Phone Note Call from Patient   Caller: Patient  [phone] Summary of Call: Pt adv that she has been exp depression, eye pressure/pain, also having to put drops in her eyes due to drying out.... Pt is more concerned with the depression she has been exp.... Concerned that this may be due to new meds: Vyvanse / Tribenzor.... Would like to be seen by Dr Lovell Sheehan this week if possible or have a return call to  [phone].  Initial call taken by: Debbra Riding,  January 01, 2010 3:43 PM  Follow-up for Phone Call        ov given for monday Follow-up by: Willy Eddy, LPN,  January 01, 2010 3:50 PM

## 2010-02-20 NOTE — Assessment & Plan Note (Signed)
Summary: depression/bmw   Vital Signs:  Patient profile:   60 year old female Height:      69 inches Weight:      166 pounds BMI:     24.60 Temp:     98.2 degrees F oral Pulse rate:   72 / minute Resp:     14 per minute BP sitting:   110 / 70  (left arm)  Vitals Entered By: Willy Eddy, LPN (January 06, 2010 3:17 PM) CC: pt stopped tribenzor and went back on exforge-  tribenzor made her sick, Hypertension Management Is Patient Diabetic? No   Primary Care Provider:  Stacie Glaze MD  CC:  pt stopped tribenzor and went back on exforge-  tribenzor made her sick and Hypertension Management.  History of Present Illness: asthma stable on symbicort had a recent cold missed the vyvance and noted depression and lack of motivation She feel that the concerta worked well with the axiety but wonders if the depression is the main component ( she has a lack of motivation and lack of joy)  Hypertension History:      She denies headache, chest pain, palpitations, dyspnea with exertion, orthopnea, PND, peripheral edema, visual symptoms, neurologic problems, syncope, and side effects from treatment.        Positive major cardiovascular risk factors include female age 64 years old or older, hyperlipidemia, and hypertension.  Negative major cardiovascular risk factors include negative family history for ischemic heart disease and non-tobacco-user status.     Preventive Screening-Counseling & Management  Alcohol-Tobacco     Smoking Status: quit     Year Quit: 1980     Passive Smoke Exposure: no     Tobacco Counseling: to remain off tobacco products  Problems Prior to Update: 1)  Adjustment Disorder With Depressed Mood  (ICD-309.0) 2)  Attention Deficit Hyperactivity Disorder, Adult  (ICD-314.01) 3)  Pelvic Pain, Acute  (ICD-789.09) 4)  Hordeolum, Internal  (ICD-373.12) 5)  Edema  (ICD-782.3) 6)  Add  (ICD-314.00) 7)  Dyspnea On Exertion  (ICD-786.09) 8)  Hypertension   (ICD-401.9) 9)  Palpitations, Recurrent  (ICD-785.1) 10)  Premature Ventricular Contractions  (ICD-427.69) 11)  Unspecified Hypothyroidism  (ICD-244.9) 12)  Back Pain, Lumbar, With Radiculopathy  (ICD-724.4) 13)  Uns Advrs Eff Uns Rx Medicinal&biological Sbstnc  (ICD-995.20) 14)  Back Pain  (ICD-724.5) 15)  Preventive Health Care  (ICD-V70.0) 16)  Hyperlipidemia  (ICD-272.4) 17)  Fibroids, Uterus  (ICD-218.9) 18)  Anxiety  (ICD-300.00) 19)  Depression  (ICD-311) 20)  Diverticulosis, Sigmoid Colon  (ICD-562.10) 21)  Gerd  (ICD-530.81) 22)  Asthma  (ICD-493.90) 23)  Allergic Rhinitis  (ICD-477.9) 24)  Irritable Bowel Syndrome  (ICD-564.1)  Current Problems (verified): 1)  Adjustment Disorder With Depressed Mood  (ICD-309.0) 2)  Attention Deficit Hyperactivity Disorder, Adult  (ICD-314.01) 3)  Pelvic Pain, Acute  (ICD-789.09) 4)  Hordeolum, Internal  (ICD-373.12) 5)  Edema  (ICD-782.3) 6)  Add  (ICD-314.00) 7)  Dyspnea On Exertion  (ICD-786.09) 8)  Hypertension  (ICD-401.9) 9)  Palpitations, Recurrent  (ICD-785.1) 10)  Premature Ventricular Contractions  (ICD-427.69) 11)  Unspecified Hypothyroidism  (ICD-244.9) 12)  Back Pain, Lumbar, With Radiculopathy  (ICD-724.4) 13)  Uns Advrs Eff Uns Rx Medicinal&biological Sbstnc  (ICD-995.20) 14)  Back Pain  (ICD-724.5) 15)  Preventive Health Care  (ICD-V70.0) 16)  Hyperlipidemia  (ICD-272.4) 17)  Fibroids, Uterus  (ICD-218.9) 18)  Anxiety  (ICD-300.00) 19)  Depression  (ICD-311) 20)  Diverticulosis, Sigmoid Colon  (ICD-562.10) 21)  Gerd  (ICD-530.81) 22)  Asthma  (ICD-493.90) 23)  Allergic Rhinitis  (ICD-477.9) 24)  Irritable Bowel Syndrome  (ICD-564.1)  Medications Prior to Update: 1)  Carvedilol 12.5 Mg Tabs (Carvedilol) .... One By Mouth Bid 2)  Symbicort 80-4.5 Mcg/act  Aero (Budesonide-Formoterol Fumarate) .... Two Puff By Mouth Two Times A Day As Needed 3)  Vivelle-Dot 0.0375 Mg/24hr Pttw (Estradiol) .... Chasnge 2 Times A  Week 4)  Prometrium 200 Mg Caps (Progesterone Micronized) .Marland Kitchen.. 1 Once Daily 5)  Synthroid 137 Mcg Tabs (Levothyroxine Sodium) .... One By Mouth Daily 6)  Lorazepam 0.5 Mg Tabs (Lorazepam) .Marland Kitchen.. 1 Once Daily As Needed For Sleep 7)  Tribenzor 40-5-25 Mg Tabs (Olmesartan-Amlodipine-Hctz) .... 1/2 By Mouth Daily 8)  Vyvanse 50 Mg Caps (Lisdexamfetamine Dimesylate) .... One By Mouth Daily  Current Medications (verified): 1)  Carvedilol 12.5 Mg Tabs (Carvedilol) .... One By Mouth Bid 2)  Symbicort 80-4.5 Mcg/act  Aero (Budesonide-Formoterol Fumarate) .... Two Puff By Mouth Two Times A Day As Needed 3)  Vivelle-Dot 0.0375 Mg/24hr Pttw (Estradiol) .... Chasnge 2 Times A Week 4)  Prometrium 200 Mg Caps (Progesterone Micronized) .Marland Kitchen.. 1 Once Daily 5)  Synthroid 137 Mcg Tabs (Levothyroxine Sodium) .... One By Mouth Daily 6)  Lorazepam 0.5 Mg Tabs (Lorazepam) .Marland Kitchen.. 1 Once Daily As Needed For Sleep 7)  Exforge Hct 5-160-12.5 Mg Tabs (Amlodipine-Valsartan-Hctz) .... 1/2 By Mouth Daily 8)  Concerta 36 Mg Cr-Tabs (Methylphenidate Hcl) .... One By Mouth Daily 9)  Budeprion Xl 150 Mg Xr24h-Tab (Bupropion Hcl) .... One By Mouth Daily  Allergies (verified): No Known Drug Allergies  Past History:  Family History: Last updated: 07/19/2006 Family History Hypertension Family History Other cancer-Pancreatic Family History of Cardiovascular disorder Fam hx CAD  Social History: Last updated: 07/19/2006 Former Smoker Alcohol use-yes Married  Risk Factors: Smoking Status: quit (01/06/2010) Passive Smoke Exposure: no (01/06/2010)  Past medical, surgical, family and social histories (including risk factors) reviewed, and no changes noted (except as noted below).  Past Medical History: Reviewed history from 05/19/2007 and no changes required. Current Problems:  FIBROIDS, UTERUS (ICD-218.9) ANXIETY (ICD-300.00) DEPRESSION (ICD-311) DIVERTICULOSIS, SIGMOID COLON (ICD-562.10) BLUNT HEAD TRAUMA  (ICD-959.01) GERD (ICD-530.81) ASTHMA (ICD-493.90) ALLERGIC RHINITIS (ICD-477.9) PREMATURE VENTRICULAR CONTRACTIONS (ICD-427.69) IRRITABLE BOWEL SYNDROME (ICD-564.1) HYPERTENSION (ICD-401.9) Hyperlipidemia  Past Surgical History: Reviewed history from 07/19/2006 and no changes required. Colonoscopy-02/05/2006 D&C Fibroid tumors- breast  Family History: Reviewed history from 07/19/2006 and no changes required. Family History Hypertension Family History Other cancer-Pancreatic Family History of Cardiovascular disorder Fam hx CAD  Social History: Reviewed history from 07/19/2006 and no changes required. Former Smoker Alcohol use-yes Married  Review of Systems  The patient denies anorexia, fever, weight loss, weight gain, vision loss, decreased hearing, hoarseness, chest pain, syncope, dyspnea on exertion, peripheral edema, prolonged cough, headaches, hemoptysis, abdominal pain, melena, hematochezia, severe indigestion/heartburn, hematuria, incontinence, genital sores, muscle weakness, suspicious skin lesions, transient blindness, difficulty walking, depression, unusual weight change, abnormal bleeding, enlarged lymph nodes, angioedema, and breast masses.    Physical Exam  General:  well-developed and well-nourished.   Head:  normocephalic.   Eyes:  pupils equal and pupils round.   Ears:  R ear normal and L ear normal.   Nose:  no nasal discharge.   Mouth:  pharynx pink and moist.   Neck:  supple.   Lungs:  normal respiratory effort and no wheezes.   Heart:  normal rate and regular rhythm.   Abdomen:  soft, normal bowel sounds, RLQ tenderness, and LUQ tenderness.  Impression & Recommendations:  Problem # 1:  ADJUSTMENT DISORDER WITH DEPRESSED MOOD (ICD-309.0) the pt has reaction to the vyvance and wishes to resume the concerta \\add  welbutrin  Problem # 2:  HYPERTENSION (ICD-401.9) Assessment: Unchanged  Her updated medication list for this problem includes:     Carvedilol 12.5 Mg Tabs (Carvedilol) ..... One by mouth bid    Exforge Hct 5-160-12.5 Mg Tabs (Amlodipine-valsartan-hctz) .Marland Kitchen... 1/2 by mouth daily  BP today: 110/70 Prior BP: 130/74 (12/09/2009)  Prior 10 Yr Risk Heart Disease: 11 % (12/09/2009)  Labs Reviewed: K+: 4.0 (12/02/2009) Creat: : 0.8 (12/02/2009)   Chol: 171 (12/02/2009)   HDL: 49.20 (12/02/2009)   LDL: 104 (12/02/2009)   TG: 89.0 (12/02/2009)  Problem # 3:  UNSPECIFIED HYPOTHYROIDISM (ICD-244.9)  Her updated medication list for this problem includes:    Synthroid 137 Mcg Tabs (Levothyroxine sodium) ..... One by mouth daily  Labs Reviewed: TSH: 0.72 (12/02/2009)    Chol: 171 (12/02/2009)   HDL: 49.20 (12/02/2009)   LDL: 104 (12/02/2009)   TG: 89.0 (12/02/2009)  Problem # 4:  FIBROIDS, UTERUS (ICD-218.9)  Complete Medication List: 1)  Carvedilol 12.5 Mg Tabs (Carvedilol) .... One by mouth bid 2)  Symbicort 80-4.5 Mcg/act Aero (Budesonide-formoterol fumarate) .... Two puff by mouth two times a day as needed 3)  Vivelle-dot 0.0375 Mg/24hr Pttw (Estradiol) .... Chasnge 2 times a week 4)  Prometrium 200 Mg Caps (Progesterone micronized) .Marland Kitchen.. 1 once daily 5)  Synthroid 137 Mcg Tabs (Levothyroxine sodium) .... One by mouth daily 6)  Lorazepam 0.5 Mg Tabs (Lorazepam) .Marland Kitchen.. 1 once daily as needed for sleep 7)  Exforge Hct 5-160-12.5 Mg Tabs (Amlodipine-valsartan-hctz) .... 1/2 by mouth daily 8)  Concerta 36 Mg Cr-tabs (Methylphenidate hcl) .... One by mouth daily 9)  Budeprion Xl 150 Mg Xr24h-tab (Bupropion hcl) .... One by mouth daily  Hypertension Assessment/Plan:      The patient's hypertensive risk group is category B: At least one risk factor (excluding diabetes) with no target organ damage.  Her calculated 10 year risk of coronary heart disease is 7 %.  Today's blood pressure is 110/70.  Her blood pressure goal is < 140/90.  Patient Instructions: 1)  Please schedule a follow-up appointment in 2  months. Prescriptions: CONCERTA 36 MG CR-TABS (METHYLPHENIDATE HCL) one by mouth daily  #30 x 0   Entered and Authorized by:   Stacie Glaze MD   Signed by:   Stacie Glaze MD on 01/06/2010   Method used:   Print then Give to Patient   RxID:   [id number] BUDEPRION XL 150 MG XR24H-TAB (BUPROPION HCL) one by mouth daily  #30 x 6   Entered and Authorized by:   Stacie Glaze MD   Signed by:   Stacie Glaze MD on 01/06/2010   Method used:   Print then Give to Patient   RxID:   [id number] CONCERTA 36 MG CR-TABS (METHYLPHENIDATE HCL) one by mouth daily  #30 x 0   Entered and Authorized by:   Stacie Glaze MD   Signed by:   Stacie Glaze MD on 01/06/2010   Method used:   Print then Give to Patient   RxID:   [id number]    Orders Added: 1)  Est. Patient Level IV [01027]    Prevention & Chronic Care Immunizations   Influenza vaccine: Fluvax 3+  (10/11/2009)    Tetanus booster: 11/21/2003: Historical    Pneumococcal vaccine: Not documented    H. zoster  vaccine: Not documented  Colorectal Screening   Hemoccult: Not documented    Colonoscopy: normal  (01/19/2006)   Colonoscopy due: 01/2016  Other Screening   Pap smear: normal  (01/18/2009)   Pap smear due: 01/2010    Mammogram: normal  (08/16/2009)   Mammogram due: 08/2010    DXA bone density scan: Not documented   Smoking status: quit  (01/06/2010)  Lipids   Total Cholesterol: 171  (12/02/2009)   LDL: 104  (12/02/2009)   LDL Direct: 174.6  (01/08/2009)   HDL: 49.20  (12/02/2009)   Triglycerides: 89.0  (12/02/2009)    SGOT (AST): 17  (12/02/2009)   SGPT (ALT): 17  (12/02/2009)   Alkaline phosphatase: 47  (12/02/2009)   Total bilirubin: 0.5  (12/02/2009)  Hypertension   Last Blood Pressure: 110 / 70  (01/06/2010)   Serum creatinine: 0.8  (12/02/2009)   Serum potassium 4.0  (12/02/2009)  Self-Management Support :    Hypertension self-management support: Not documented    Lipid  self-management support: Not documented

## 2010-03-06 NOTE — Assessment & Plan Note (Signed)
Summary: 1 month rov/njr   Vital Signs:  Patient profile:   60 year old female Height:      69 inches Weight:      168 pounds BMI:     24.90 Temp:     98.2 degrees F oral Pulse rate:   68 / minute Resp:     14 per minute BP sitting:   110 / 70  (left arm)  Vitals Entered By: Willy Eddy, LPN (February 10, 2010 1:42 PM) CC: roa-pt states new med budeprion works "ok" with no s.e.- c/o pelvin pain and having hot flashes at night again Is Patient Diabetic? No ner  Primary Care Provider:  Stacie Glaze MD  CC:  roa-pt states new med budeprion works "ok" with no s.e.- c/o pelvin pain and having hot flashes at night again.  History of Present Illness: The pelvic pain is worse...  hx of fibroids.. like a menstraul period. Possible cramping. Bowel movements have been "hit and miss" The exforge caused more constipation. Had an [place] and GYN review recently Mild constipation Mood is better GYN stated check six months Hot flashes noted notes urine with intermitant dark color   Preventive Screening-Counseling & Management  Alcohol-Tobacco     Smoking Status: quit     Year Quit: 1980     Passive Smoke Exposure: no     Tobacco Counseling: to remain off tobacco products  Problems Prior to Update: 1)  Adjustment Disorder With Depressed Mood  (ICD-309.0) 2)  Attention Deficit Hyperactivity Disorder, Adult  (ICD-314.01) 3)  Pelvic Pain, Acute  (ICD-789.09) 4)  Hordeolum, Internal  (ICD-373.12) 5)  Edema  (ICD-782.3) 6)  Add  (ICD-314.00) 7)  Dyspnea On Exertion  (ICD-786.09) 8)  Hypertension  (ICD-401.9) 9)  Palpitations, Recurrent  (ICD-785.1) 10)  Premature Ventricular Contractions  (ICD-427.69) 11)  Unspecified Hypothyroidism  (ICD-244.9) 12)  Back Pain, Lumbar, With Radiculopathy  (ICD-724.4) 13)  Uns Advrs Eff Uns Rx Medicinal&biological Sbstnc  (ICD-995.20) 14)  Back Pain  (ICD-724.5) 15)  Preventive Health Care  (ICD-V70.0) 16)  Hyperlipidemia  (ICD-272.4) 17)   Fibroids, Uterus  (ICD-218.9) 18)  Anxiety  (ICD-300.00) 19)  Depression  (ICD-311) 20)  Diverticulosis, Sigmoid Colon  (ICD-562.10) 21)  Gerd  (ICD-530.81) 22)  Asthma  (ICD-493.90) 23)  Allergic Rhinitis  (ICD-477.9) 24)  Irritable Bowel Syndrome  (ICD-564.1)  Current Problems (verified): 1)  Adjustment Disorder With Depressed Mood  (ICD-309.0) 2)  Attention Deficit Hyperactivity Disorder, Adult  (ICD-314.01) 3)  Pelvic Pain, Acute  (ICD-789.09) 4)  Hordeolum, Internal  (ICD-373.12) 5)  Edema  (ICD-782.3) 6)  Add  (ICD-314.00) 7)  Dyspnea On Exertion  (ICD-786.09) 8)  Hypertension  (ICD-401.9) 9)  Palpitations, Recurrent  (ICD-785.1) 10)  Premature Ventricular Contractions  (ICD-427.69) 11)  Unspecified Hypothyroidism  (ICD-244.9) 12)  Back Pain, Lumbar, With Radiculopathy  (ICD-724.4) 13)  Uns Advrs Eff Uns Rx Medicinal&biological Sbstnc  (ICD-995.20) 14)  Back Pain  (ICD-724.5) 15)  Preventive Health Care  (ICD-V70.0) 16)  Hyperlipidemia  (ICD-272.4) 17)  Fibroids, Uterus  (ICD-218.9) 18)  Anxiety  (ICD-300.00) 19)  Depression  (ICD-311) 20)  Diverticulosis, Sigmoid Colon  (ICD-562.10) 21)  Gerd  (ICD-530.81) 22)  Asthma  (ICD-493.90) 23)  Allergic Rhinitis  (ICD-477.9) 24)  Irritable Bowel Syndrome  (ICD-564.1)  Medications Prior to Update: 1)  Carvedilol 12.5 Mg Tabs (Carvedilol) .... One By Mouth Bid 2)  Symbicort 80-4.5 Mcg/act  Aero (Budesonide-Formoterol Fumarate) .... Two Puff By Mouth Two Times A Day  As Needed 3)  Vivelle-Dot 0.0375 Mg/24hr Pttw (Estradiol) .... Chasnge 2 Times A Week 4)  Prometrium 200 Mg Caps (Progesterone Micronized) .Marland Kitchen.. 1 Once Daily 5)  Synthroid 137 Mcg Tabs (Levothyroxine Sodium) .... One By Mouth Daily 6)  Lorazepam 0.5 Mg Tabs (Lorazepam) .Marland Kitchen.. 1 Once Daily As Needed For Sleep 7)  Exforge Hct 5-160-12.5 Mg Tabs (Amlodipine-Valsartan-Hctz) .... 1/2 By Mouth Daily 8)  Concerta 36 Mg Cr-Tabs (Methylphenidate Hcl) .... One By Mouth Daily 9)   Budeprion Xl 150 Mg Xr24h-Tab (Bupropion Hcl) .... One By Mouth Daily  Current Medications (verified): 1)  Carvedilol 12.5 Mg Tabs (Carvedilol) .... One By Mouth Bid 2)  Symbicort 80-4.5 Mcg/act  Aero (Budesonide-Formoterol Fumarate) .... Two Puff By Mouth Two Times A Day As Needed 3)  Vivelle-Dot 0.0375 Mg/24hr Pttw (Estradiol) .... Chasnge 2 Times A Week 4)  Prometrium 200 Mg Caps (Progesterone Micronized) .Marland Kitchen.. 1 Once Daily 5)  Synthroid 137 Mcg Tabs (Levothyroxine Sodium) .... One By Mouth Daily 6)  Lorazepam 0.5 Mg Tabs (Lorazepam) .Marland Kitchen.. 1 Once Daily As Needed For Sleep 7)  Exforge Hct 5-160-12.5 Mg Tabs (Amlodipine-Valsartan-Hctz) .... 1/2 By Mouth Daily 8)  Concerta 36 Mg Cr-Tabs (Methylphenidate Hcl) .... One By Mouth Daily 9)  Budeprion Xl 150 Mg Xr24h-Tab (Bupropion Hcl) .... One By Mouth Daily  Allergies (verified): No Known Drug Allergies  Past History:  Family History: Last updated: 07/19/2006 Family History Hypertension Family History Other cancer-Pancreatic Family History of Cardiovascular disorder Fam hx CAD  Social History: Last updated: 07/19/2006 Former Smoker Alcohol use-yes Married  Risk Factors: Smoking Status: quit (02/10/2010) Passive Smoke Exposure: no (02/10/2010)  Past medical, surgical, family and social histories (including risk factors) reviewed, and no changes noted (except as noted below).  Past Medical History: Reviewed history from 05/19/2007 and no changes required. Current Problems:  FIBROIDS, UTERUS (ICD-218.9) ANXIETY (ICD-300.00) DEPRESSION (ICD-311) DIVERTICULOSIS, SIGMOID COLON (ICD-562.10) BLUNT HEAD TRAUMA (ICD-959.01) GERD (ICD-530.81) ASTHMA (ICD-493.90) ALLERGIC RHINITIS (ICD-477.9) PREMATURE VENTRICULAR CONTRACTIONS (ICD-427.69) IRRITABLE BOWEL SYNDROME (ICD-564.1) HYPERTENSION (ICD-401.9) Hyperlipidemia  Past Surgical History: Reviewed history from 07/19/2006 and no changes  required. Colonoscopy-02/05/2006 D&C Fibroid tumors- breast  Family History: Reviewed history from 07/19/2006 and no changes required. Family History Hypertension Family History Other cancer-Pancreatic Family History of Cardiovascular disorder Fam hx CAD  Social History: Reviewed history from 07/19/2006 and no changes required. Former Smoker Alcohol use-yes Married  Review of Systems  The patient denies anorexia, fever, weight loss, weight gain, vision loss, decreased hearing, hoarseness, chest pain, syncope, dyspnea on exertion, peripheral edema, prolonged cough, headaches, hemoptysis, abdominal pain, melena, hematochezia, severe indigestion/heartburn, hematuria, incontinence, genital sores, muscle weakness, suspicious skin lesions, transient blindness, difficulty walking, depression, unusual weight change, abnormal bleeding, enlarged lymph nodes, angioedema, and breast masses.    Physical Exam  General:  well-developed and well-nourished.   Head:  normocephalic.   Eyes:  pupils equal and pupils round.   Ears:  R ear normal and L ear normal.   Nose:  no nasal discharge.   Mouth:  pharynx pink and moist.   Neck:  supple.   Lungs:  normal respiratory effort and no wheezes.   Heart:  normal rate and regular rhythm.   Abdomen:  soft, normal bowel sounds, RLQ tenderness, and LUQ tenderness.   Msk:  normal ROM and no joint tenderness.   Extremities:  No clubbing, cyanosis, edema, or deformity noted with normal full range of motion of all joints.   Neurologic:  alert & oriented X3.  Impression & Recommendations:  Problem # 1:  ADJUSTMENT DISORDER WITH DEPRESSED MOOD (ICD-309.0) mood is better  since the patient has resumed Wellbutrin the patient has tolerated this medication.  Problem # 2:  PALPITATIONS, RECURRENT (ICD-785.1)  Her updated medication list for this problem includes:    Carvedilol 12.5 Mg Tabs (Carvedilol) ..... One by mouth bid  Avoid caffeine, chocolate,  and stimulants. Stress reduction as well as medication options discussed.   Problem # 3:  PELVIC PAIN, ACUTE (ICD-789.09) UA negative treat constipation and consider fibriod? Orders: UA Dipstick w/Micro (automated) (81001)  Discussed use of medications, application of heat or cold, and exercises.   Problem # 4:  UNSPECIFIED HYPOTHYROIDISM (ICD-244.9)  Her updated medication list for this problem includes:    Synthroid 137 Mcg Tabs (Levothyroxine sodium) ..... One by mouth daily  Labs Reviewed: TSH: 0.72 (12/02/2009)    Chol: 171 (12/02/2009)   HDL: 49.20 (12/02/2009)   LDL: 104 (12/02/2009)   TG: 89.0 (12/02/2009)  Problem # 5:  IRRITABLE BOWEL SYNDROME (ICD-564.1) fiber  Complete Medication List: 1)  Carvedilol 12.5 Mg Tabs (Carvedilol) .... One by mouth bid 2)  Symbicort 80-4.5 Mcg/act Aero (Budesonide-formoterol fumarate) .... Two puff by mouth two times a day as needed 3)  Vivelle-dot 0.0375 Mg/24hr Pttw (Estradiol) .... Chasnge 2 times a week 4)  Prometrium 200 Mg Caps (Progesterone micronized) .Marland Kitchen.. 1 once daily 5)  Synthroid 137 Mcg Tabs (Levothyroxine sodium) .... One by mouth daily 6)  Lorazepam 0.5 Mg Tabs (Lorazepam) .Marland Kitchen.. 1 once daily as needed for sleep 7)  Exforge Hct 5-160-12.5 Mg Tabs (Amlodipine-valsartan-hctz) .... 1/2 by mouth daily 8)  Concerta 36 Mg Cr-tabs (Methylphenidate hcl) .... One by mouth daily 9)  Budeprion Xl 150 Mg Xr24h-tab (Bupropion hcl) .... One by mouth daily  Patient Instructions: 1)  Metamucil or benifiber twice a day  ( decrease in gas and inceased in bowel frequency and softness) 2)  Please schedule a follow-up appointment in 2 months.   Orders Added: 1)  UA Dipstick w/Micro (automated) [81001] 2)  Est. Patient Level IV [[address]]    Laboratory Results   Urine Tests    Routine Urinalysis   Color: yellow Appearance: Clear Glucose: negative   (Normal Range: Negative) Bilirubin: negative   (Normal Range: Negative) Ketone:  negative   (Normal Range: Negative) Spec. Gravity: <1.005   (Normal Range: 1.003-1.035) Blood: negative   (Normal Range: Negative) pH: 6.0   (Normal Range: 5.0-8.0) Protein: negative   (Normal Range: Negative) Urobilinogen: 0.2   (Normal Range: 0-1) Nitrite: negative   (Normal Range: Negative) Leukocyte Esterace: negative   (Normal Range: Negative)    Comments: Rita Ohara  February 10, 2010 1:49 PM

## 2010-04-10 ENCOUNTER — Encounter: Payer: Self-pay | Admitting: Internal Medicine

## 2010-04-14 ENCOUNTER — Ambulatory Visit: Payer: BC Managed Care – PPO | Admitting: Internal Medicine

## 2010-04-14 DIAGNOSIS — F329 Major depressive disorder, single episode, unspecified: Secondary | ICD-10-CM

## 2010-04-14 NOTE — Patient Instructions (Signed)
Obtain B12 dots  And take one sublingually every day due to the pristiq  50 mg take that for 2 weeks then increase it to 100 mg for the next 2 weeks we'll see each other in 4 weeks  The prostate should be more energized in the higher the dose so when you go from 50-100 you should have actually an increased energy level side effects T. Are those of every antidepressant class which may include over agitation in which you would want to stop the medication any suicidal thoughts you would want to stop the medication in any sense of being drugged or clouded you would want to stop the medication

## 2010-04-14 NOTE — Progress Notes (Signed)
  Subjective:    Patient ID: Erica Marquez, female    DOB: [birth year], 60 y.o.   MRN: [id number]  HPI Micah Flesher off the concerta on feb 15th, on the buprobrion with good mood but no energy and lack of focus Went to see [facility] physician and started pristiq .Marland Kitchen..experienced palpitations    Review of Systems     Objective:   Physical Exam        Assessment & Plan:

## 2010-05-12 ENCOUNTER — Encounter: Payer: Self-pay | Admitting: Internal Medicine

## 2010-05-12 ENCOUNTER — Ambulatory Visit (INDEPENDENT_AMBULATORY_CARE_PROVIDER_SITE_OTHER): Payer: BC Managed Care – PPO | Admitting: Internal Medicine

## 2010-05-12 VITALS — BP 110/70 | HR 76 | Temp 98.2°F | Resp 14 | Ht 69.0 in | Wt 162.0 lb

## 2010-05-12 DIAGNOSIS — F3289 Other specified depressive episodes: Secondary | ICD-10-CM

## 2010-05-12 DIAGNOSIS — F329 Major depressive disorder, single episode, unspecified: Secondary | ICD-10-CM

## 2010-05-12 DIAGNOSIS — I10 Essential (primary) hypertension: Secondary | ICD-10-CM

## 2010-05-12 DIAGNOSIS — M6789 Other specified disorders of synovium and tendon, multiple sites: Secondary | ICD-10-CM

## 2010-05-12 DIAGNOSIS — R002 Palpitations: Secondary | ICD-10-CM

## 2010-05-12 DIAGNOSIS — M6749 Ganglion, multiple sites: Secondary | ICD-10-CM

## 2010-05-12 NOTE — Progress Notes (Signed)
Subjective:    Patient ID: Erica Marquez, female    DOB: [birth year], 60 y.o.   MRN: [id number]  HPI improvement with the pristiq will taper off the welbutrin and increase the pristiq  To 100 The pt noted low pusle ( 60's) The pt has noted increased palpitations. Not exertional. The feels is that of an irregular HR. But is not consistant     Review of Systems  Constitutional: Negative for activity change, appetite change and fatigue.  HENT: Negative for ear pain, congestion, neck pain, postnasal drip and sinus pressure.   Eyes: Negative for redness and visual disturbance.  Respiratory: Negative for cough, shortness of breath and wheezing.   Gastrointestinal: Negative for abdominal pain and abdominal distention.  Genitourinary: Negative for dysuria, frequency and menstrual problem.  Musculoskeletal: Negative for myalgias, joint swelling and arthralgias.  Skin: Negative for rash and wound.  Neurological: Negative for dizziness, weakness and headaches.  Hematological: Negative for adenopathy. Does not bruise/bleed easily.  Psychiatric/Behavioral: Negative for sleep disturbance and decreased concentration.   Past Medical History  Diagnosis Date  . Arthritis   . Depression   . Allergy   . Asthma   . GERD (gastroesophageal reflux disease)   . Hypertension   . Hyperlipidemia   . Fibroids   . Diverticulosis of colon   . Blunt head trauma   . Premature ventricular contractions   . IBS (irritable bowel syndrome)    Past Surgical History  Procedure Date  . Breast surgery     fibroid tumors  . Dilation and curettage of uterus     reports that she has quit smoking. She does not have any smokeless tobacco history on file. She reports that she drinks alcohol. Her drug history not on file. family history includes Cancer in her father; Coronary artery disease in an unspecified family member; and Heart disease in her mother. No Known Allergies     Objective:   Physical Exam    Nursing note and vitals reviewed. Constitutional: She is oriented to person, place, and time. She appears well-developed and well-nourished. No distress.  HENT:  Head: Normocephalic and atraumatic.  Right Ear: External ear normal.  Left Ear: External ear normal.  Nose: Nose normal.  Mouth/Throat: Oropharynx is clear and moist.  Eyes: Conjunctivae and EOM are normal. Pupils are equal, round, and reactive to light.  Neck: Normal range of motion. Neck supple. No JVD present. No tracheal deviation present. No thyromegaly present.  Cardiovascular: Normal rate, regular rhythm, normal heart sounds and intact distal pulses.   No murmur heard. Pulmonary/Chest: Effort normal and breath sounds normal. She has no wheezes. She exhibits no tenderness.  Abdominal: Soft. Bowel sounds are normal.  Musculoskeletal: Normal range of motion. She exhibits no edema and no tenderness.  Lymphadenopathy:    She has no cervical adenopathy.  Neurological: She is alert and oriented to person, place, and time. She has normal reflexes. No cranial nerve deficit.  Skin: Skin is warm and dry. She is not diaphoretic.  Psychiatric: She has a normal mood and affect. Her behavior is normal.          Assessment & Plan:  We discussed her blood pressure and the fact that she needs to continue her Coreg on a scheduled basis and not adjust the dose as this may contribute to her palpitations we also discussed a pulse in the 60s are not too low. When she has noticed pulses in the 60s she has adjusted her dose. she also  has stopped her hormones and some of the symptoms that she has experienced may be due to the cessation of hormone therapy. Of note he is also her risks of developing atrial fibrillation prior workup by a cardiologist and a lamina did a catheterization which showed nonobstructive coronary disease status with the possibility of developing atrial fibrillation do to her frequent PACs. Should the symptoms persist after  she has fully withdrawn from her hormone therapy I would recommend a event monitor. Over 30 minutes was spent face-to-face discussing this problem

## 2010-06-03 NOTE — Assessment & Plan Note (Signed)
[facility]                                 ON-CALL NOTE   KRISSY, OREBAUGH                       MRN:          [id number]  DATE:02/20/2007                            DOB:          [birth year]    DATE/TIME:  February 20, 2007 at 11:10 a.m.   PHONE:  [phone].   REGULAR DOCTOR:  Dr. Jomarie Longs.   ON-CALL:  Dr. Milinda Antis   She complained of high blood pressure.  The patient states that her  blood pressure had been up lately.  Previously she was taking a half of  an Avalide 150 with 6.25 Coreg twice a day.  In the last week or so her  blood pressure went up and per Dr. Lovell Sheehan' advice she has increased  Avalide to a whole pill.  Her blood pressure continued to go up and she  went ahead in the last 2 days and added another 6.25 Coreg, but this  morning she passed out in the shower, with a blood pressure of 64/36 and  a pulse of 47.  She said she is much better now, with blood pressure of  123/76 and a pulse of 78.  She has not taken any more medicine today.  I  advised her not to take any more medications today.  When her blood  pressure goes up above 150/90 to go ahead and take her Avalide,  otherwise to call Dr. Lovell Sheehan' office in the morning for further  advisement.  If she starts feeling poorly again, she will go straight to  the emergency room.  If she has any further questions she will call  back.     Marne A. Tower, MD  Electronically Signed    MAT/MedQ  DD: 02/20/2007  DT: 02/20/2007  Job #: [id number]

## 2010-06-03 NOTE — Assessment & Plan Note (Signed)
[facility]                         GASTROENTEROLOGY OFFICE NOTE   Erica Marquez, Erica Marquez                       MRN:          [id number]  DATE:09/29/2006                            DOB:          Oct 04, 1950    REFERRING PHYSICIAN:  Stacie Glaze, MD   OFFICE CONSULTATION   REASON FOR CONSULTATION:  Reflux symptoms and chest pain.   HISTORY OF PRESENT ILLNESS:  This is a 60 year old white female whom I  have evaluated earlier this year for constipation and colorectal cancer  screening.  She underwent colonoscopy in January of 2008, which showed  only sigmoid colon diverticulosis.  She relates problems for the past 6  months with heartburn, indigestion, intermittent discomfort with  swallowing, with no dysphagia, and a burning midepigastric pain.  She  has had problems with constipation intermittently for years and this is  sometimes associated with abdominal bloating.  She notes occasional  hemorrhoidal symptoms, but they have not recently been active.  She  notes no weight loss, nausea or vomiting, melena, or hematochezia.  Due  to her chest symptoms, she underwent a cardiac catheterization in  [place], [place] in February 2008, which she reports was normal.  Over  the past 6 months, she has been treated with Protonix on a daily basis,  and this was increased to twice daily for the past 2 months with little  change in symptoms.   FAMILY HISTORY:  Negative for colon cancer.  Her mother has had colon  polyps.  No history of inflammatory bowel disease.   PAST MEDICAL HISTORY:  Hypertension.  Allergic rhinitis.  Asthma.  GERD.  Diverticulosis.  History of irritable bowel syndrome.  Depression.  Anxiety.  Uterine fibroids.  Status post breast biopsy for benign lesions 1978 and 1984.  Status post D&C.   CURRENT MEDICATIONS:  Listed on the chart, updated, and reviewed.   MEDICATION ALLERGIES:  None known.   SOCIAL HISTORY AND REVIEW OF  SYSTEMS:  Per the hand-written form.   PHYSICAL EXAM:  Well-developed, well-nourished in no acute distress.  Height 5 feet 9 inches, weight 171.8 pounds.  Blood pressure 108/74,  pulse 68 and regular.  HEENT:  Anicteric sclerae, oropharynx clear.  CHEST:  Clear to auscultation bilaterally.  CARDIAC:  Regular rate and rhythm without murmurs appreciated.  ABDOMEN:  Soft, nontender, nondistended.  Normoactive bowel sounds.  No  palpable organomegaly, masses, or hernias.   ASSESSMENT AND PLAN:  Reflux symptoms with occasional odynophagia.  She  also has chest pain, which may be related to gastroesophageal reflux  disease or other causes.  Continue Protonix 40 mg p.o. b.i.d. and begin  strict antireflux measures.  Written literature is supplied.  Risks,  benefits, and alternatives to upper endoscopy with possible biopsy and  possible dilation discussed with the patient, and she consents to  proceed.  This will be scheduled electively.     Venita Lick. Russella Dar, MD, [facility]  Electronically Signed    MTS/MedQ  DD: 10/08/2006  DT: 10/09/2006  Job #: [id number]   cc:   Stacie Glaze, MD

## 2010-06-03 NOTE — Letter (Signed)
January 12, 2008    Stacie Glaze, MD  [address] [place] [address]   RE:  Erica Marquez, Erica Marquez  MRN:  [id number]  /  DOB:  Apr 12, 1950   Dear Jonny Ruiz:   I hope this letter finds you well and [facility] Christmas.   Thank you for asking [place] to see Ms. Lissa Hoard for thoughts about her blood  pressure and her dyslipidemia.  Her PVCs which are outflow tract driven  have been largely quiescent.   About a month ago she had an episode where she had rapid palpitations  which she felt like were as if she had been frightened, somewhat  distinct from her outflow tract PVCs.   They have subsequently resolved.  She has been on Coreg for a long time  for the PVCs and they have been quiescent.   Her blood pressure has been an issue.  Recent up-titration of her Avapro  from 75 to 150 resulted in a decrease in her blood pressure to the 90  range where it is this morning.   She also comes in with lipid abnormalities and a query about thoughts  about that.   Her medications currently include Avalide 150/12.5, Vivelle patches  hormonal therapy.  She takes BuSpar and Wellbutrin as well as Synthroid,  Coreg 6.25, aspirin.   On examination her blood pressure was 90/60, her pulse was 69.  Her  weight was 174, which was stable over the last year and half.  Her neck  veins were flat.  Her lungs were clear.  Heart sounds were regular.  Abdomen was soft.  Extremities were without edema.   IMPRESSION:  1. Hypertension, now with low blood pressure.  2. Elevated low density lipoprotein with a level of 161 and high      density lipoprotein of 50.  3. Premature ventricular contractions originating from the outflow      tract.   As relates to the latter, we will continue her on her Coreg.  As relates  to the first, I suggest that we stop the Avalide, especially given its  cost, and I have given her a prescription for lisinopril 20.  She is to  take 10 mg a day for the first couple of weeks.  If her  blood pressure  does not get down to the 120/80 range, she is to increase it to 20 mg a  day.  She does have a blood pressure machine at home.   As relates to her dyslipidemia, I suggested that she try red yeast rice  in addition to the fish oil that you prescribed.  I thought what we  could do is check her LFTs and her renal function in 4 weeks to assess  the aforementioned drugs.   We will be glad to see her again at your request.    Sincerely,      Duke Salvia, MD, [facility]  Electronically Signed    SCK/MedQ  DD: 01/12/2008  DT: 01/12/2008  Job #: [id number]

## 2010-06-03 NOTE — Letter (Signed)
May 31, 2006    Stacie Glaze, MD  [address] [place], [place] [address]   RE:  Erica Marquez, Erica Marquez  MRN:  [id number]  /  DOB:  11-19-1950   Dear Erica Marquez,   It was a pleasure to see your patient Erica Marquez today in consultation  regarding her irregular heartbeats and flutters.   As you know, she is a 60 year old, married, mother of 2, who while in  [place] a couple of months ago developed some significant chest pains.  She was hospitalized, she underwent a stress test which  developed some  wide complex tachycardia which she recalls being told was atrial  fibrillation. Because of this whatever it turned out to be, she  underwent a catheterization that demonstrated no obstructive disease.   Upon returning, she saw you and was referred to see Dr. Earl Gala over at [facility]. His comments describe a right ventricular  outflow tract PVC. He also undertook a T wave alternating study and  signal average ECG both of which were apparently normal. He sent her a  letter reassuring her that she was at very low risk for sudden death.   She comes in because she remains concerned about the cause of these  spells.   She has also had problems with nocturnal fluttering with amiodarone. She  describes it as fairly irregular.   Significant problems with hypertension. She has taken multiple drugs in  the past. She is now taking Coreg which she describes as giving her a  sensation that her head is very full; and is quite uncomfortable for  her.   The patient also describes a spell that was characterized as  hypoglycemia. She describes standing for about 20 minutes at her friends  house where the room was very warm, she got hot, diaphoretic, got  somewhat flushed and became presyncopal. She was described as being  white as a ghost and had extreme residual fatigue.   PAST MEDICAL HISTORY:  In addition to the above is notable for menopause  and perimenopausal difficulties including  anxiety and depression. She is  also hypothyroid. She has GE reflux disease, constipation, asthma and  allergies.   PAST SURGICAL HISTORY:  Notable for D&C and fibroid tumors from her  breast.   SOCIAL HISTORY:  This is noted previously. She is married, she has 2  children. She does not use cigarettes or recreational drugs. She does  use alcohol occasionally.   CURRENT MEDICATIONS:  1. Coreg 18.75.  2. BuSpar 15 b.i.d.  3. Protonix 40 b.i.d.  4. Wellbutrin 130.  5. Synthroid 137.  6. Prempro 3/1.5.  7. Aspirin 81.  8. Benadryl, Symbicort, Ativan p.r.n.   She has no known drug allergies.   PHYSICAL EXAMINATION:  GENERAL:  She is a middle to older Caucasian  female appearing her stated age of 45.  VITAL SIGNS:  Her blood pressure was 106/64, her pulse was 55, her  weight was 170.  HEENT:  No icterus or xanthoma.  NECK:  Neck veins were flat. Carotids were brisk and full bilaterally  without bruits.  BACK:  Without kyphosis or scoliosis.  LUNGS:  Clear.  HEART:  Sounds were regular without murmurs or gallops.  ABDOMEN:  Soft, active bowel sounds.  EXTREMITIES:  Without edema. There was no clubbing or cyanosis.  NEUROLOGIC:  Grossly normal.  SKIN:  Warm and dry.   Electrocardiogram  dated today demonstrated sinus rhythm at 55 with  intervals of 0.15/0.09/0.41, the axis  was 55 degrees. The data was  reviewed from [facility] which included the negative T wave comments. There  was a stress test undertaken with descriptions of occasional PVCs but  without a description of the beats.   IMPRESSION:  1. PVCs described as originating from the left ventricular outflow      tract.  2. A wide complex tachycardia associated with a stress test in      [place], records not available, question A fib, question      nonsustained ventricular tachycardia.  3. Hypertension that was difficult to control.  4. Head fullness, questions side effect of Coreg.  5. Treated hypothyroidism.  6.  Irregular fluttering on Ambien nocturnally, questionable atrial      fibrillation.   DISCUSSION:  Lynsi, Dooner probably has PVCs from the RVOT. I  discussed with her the epidemiology of them and the mechanistic  relationship to adrenaline.   We also talked about the possibility that there was some in fact atrial  fibrillation at the time of her stress test which is not verifiable. She  certainly is at risk for it with her hypertension. The nocturnal  symptoms are concerning and if she has more nocturnal symptoms, I would  recommend the use of an atrial fibrillation detection monitor to help [place]  sort this out.   In the interim, I have given her a prescription for Avalide 150/12.5 to  help control her blood pressure until we can get her Coreg dose down.  She apparently tolerated the former in the past although it was not in  and of itself adequately effective. Perhaps in conjunction with the  Coreg it will be.   She is suppose to see you again in about 3 weeks.   She asked if she could followup with me once in about 3 months so I have  taken the liberty of setting that up.   John, again, thank you very much for asking [place] to see her. It was a  pleasure.    Sincerely,      Duke Salvia, MD, [facility]  Electronically Signed    SCK/MedQ  DD: 05/31/2006  DT: 05/31/2006  Job #: [id number]

## 2010-06-12 ENCOUNTER — Other Ambulatory Visit: Payer: Self-pay | Admitting: Internal Medicine

## 2010-06-20 ENCOUNTER — Other Ambulatory Visit: Payer: Self-pay | Admitting: *Deleted

## 2010-06-20 MED ORDER — LORAZEPAM 0.5 MG PO TABS: 0.5000 mg | ORAL_TABLET | Freq: Every evening | ORAL | Status: DC | PRN

## 2010-06-30 ENCOUNTER — Ambulatory Visit: Payer: BC Managed Care – PPO | Admitting: Internal Medicine

## 2010-07-03 ENCOUNTER — Telehealth: Payer: Self-pay | Admitting: *Deleted

## 2010-07-03 NOTE — Telephone Encounter (Signed)
Pt is having trouble with BM. She states that she had a stomach ache x 1 week ago. Then she got constipation. She used suppositories. 3-4 days ago she started having long piece that is wraped in the stool. It's a off white color and you can spread it a part. She is also having stomach pains. Then yesterday she noticed a piece of yellow in her stool. No fever, no traveling outside the [place]. Pt to come in tomorrow to see Dr. Rodena Medin at 9:15am

## 2010-07-04 ENCOUNTER — Encounter: Payer: Self-pay | Admitting: Internal Medicine

## 2010-07-04 ENCOUNTER — Ambulatory Visit (INDEPENDENT_AMBULATORY_CARE_PROVIDER_SITE_OTHER): Payer: BC Managed Care – PPO | Admitting: Internal Medicine

## 2010-07-04 VITALS — BP 120/88 | HR 70 | Wt 172.0 lb

## 2010-07-04 DIAGNOSIS — R109 Unspecified abdominal pain: Secondary | ICD-10-CM

## 2010-07-04 LAB — HEPATIC FUNCTION PANEL
ALT: 28 U/L (ref 0–35)
Total Bilirubin: 0.6 mg/dL (ref 0.3–1.2)
Total Protein: 7.3 g/dL (ref 6.0–8.3)

## 2010-07-04 LAB — BASIC METABOLIC PANEL
GFR: 114.26 mL/min (ref >60.00)
Potassium: 3.9 mEq/L (ref 3.5–5.1)
Sodium: 139 mEq/L (ref 135–145)

## 2010-07-04 LAB — CBC WITH DIFFERENTIAL/PLATELET
Basophils Relative: 0.8 % (ref 0.0–3.0)
Eosinophils Absolute: 0.1 10*3/uL (ref 0.0–0.7)
Eosinophils Relative: 2.3 % (ref 0.0–5.0)
HCT: 41.4 % (ref 36.0–46.0)
Lymphs Abs: 1.8 10*3/uL (ref 0.7–4.0)
MCHC: 34.3 g/dL (ref 30.0–36.0)
MCV: 87.3 fl (ref 78.0–100.0)
Monocytes Absolute: 0.6 10*3/uL (ref 0.1–1.0)
Platelets: 223 10*3/uL (ref 150.0–400.0)
WBC: 6.3 10*3/uL (ref 4.5–10.5)

## 2010-07-04 MED ORDER — METRONIDAZOLE 500 MG PO TABS: 500.0000 mg | ORAL_TABLET | Freq: Three times a day (TID) | ORAL | Status: AC

## 2010-07-04 NOTE — Assessment & Plan Note (Signed)
Improving symptoms however associated with abnormal stool formation. Obtain CBC, Chem-7 and LFTs. Begin empiric flagyl x 7 days. Add probiotic. Follow up if no improvement or worsening.

## 2010-07-04 NOTE — Progress Notes (Signed)
  Subjective:    Patient ID: Erica Marquez, female    DOB: 04/17/1950, 60 y.o.   MRN: [id number]  HPI Pt presents to clinic for evaluation of abdominal pain. Notes approximately one week history of abdominal pain bilateral lower quadrants without radiation. This was followed by constipation treated with a suppository with subsequent three days of abnormal stool formation. No diarrhea however notes off-white to tan stranding next with the stool. No blood noted. Abdominal pain has significant improved and is now dull. No fever chills weight loss. No abnormal food exposure, international travel or other trigger of interest. No alleviating or exacerbating factors. No other complaints. Patient brought sample of stool with her and is examined by myself today. Area of interest is approximately 4 cm in length filamentous and stranding of tan color. Appears organic but not consistent with worms/parasites. Stool is heme-negative  Reviewed past medical history, medications and allergies.  Review of Systems  Constitutional: Negative for fever, chills, fatigue and unexpected weight change.  Gastrointestinal: Positive for abdominal pain and constipation. Negative for nausea, vomiting, diarrhea, blood in stool and abdominal distention.  Skin: Negative for color change, pallor and rash.       Objective:   Physical Exam  Nursing note and vitals reviewed. Constitutional: She appears well-developed and well-nourished. No distress.  HENT:  Head: Normocephalic and atraumatic.  Right Ear: External ear normal.  Left Ear: External ear normal.  Eyes: Conjunctivae are normal. No scleral icterus.  Abdominal: Soft. Bowel sounds are normal. She exhibits no distension and no mass. There is no hepatosplenomegaly. There is tenderness in the right lower quadrant and left lower quadrant. There is no rebound and no guarding.       Very mild tenderness to palpation bilateral lower quadrants without rebound guarding or rigidity.   Neurological: She is alert.  Skin: Skin is warm and dry. No rash noted. She is not diaphoretic. No erythema.  Psychiatric: She has a normal mood and affect.          Assessment & Plan:

## 2010-07-07 ENCOUNTER — Telehealth: Payer: Self-pay

## 2010-07-07 NOTE — Telephone Encounter (Signed)
Pt.notified

## 2010-07-07 NOTE — Telephone Encounter (Signed)
Message copied by Beverely Low on Mon Jul 07, 2010  3:31 PM ------      Message from: Staci Righter      Created: Sun Jul 06, 2010 11:50 AM       Labs nl

## 2010-07-09 ENCOUNTER — Ambulatory Visit (INDEPENDENT_AMBULATORY_CARE_PROVIDER_SITE_OTHER): Payer: BC Managed Care – PPO | Admitting: Internal Medicine

## 2010-07-09 ENCOUNTER — Encounter: Payer: Self-pay | Admitting: Internal Medicine

## 2010-07-09 DIAGNOSIS — E039 Hypothyroidism, unspecified: Secondary | ICD-10-CM

## 2010-07-09 DIAGNOSIS — F909 Attention-deficit hyperactivity disorder, unspecified type: Secondary | ICD-10-CM

## 2010-07-09 DIAGNOSIS — M549 Dorsalgia, unspecified: Secondary | ICD-10-CM

## 2010-07-09 DIAGNOSIS — I10 Essential (primary) hypertension: Secondary | ICD-10-CM

## 2010-07-09 DIAGNOSIS — E785 Hyperlipidemia, unspecified: Secondary | ICD-10-CM

## 2010-07-09 NOTE — Progress Notes (Signed)
Subjective:    Patient ID: Erica Marquez, female    DOB: 07/28/1950, 60 y.o.   MRN: [id number]  HPI  Patient is a 60 year old white female who presented with a GI syndrome in which she noticed diminished material in her stools intermittent constipation with pellet-like stools some abdominal discomfort.  Appropriate blood work was obtained and an evaluation by my partner to give her Flagyl and obtained repeat screening labs today she is doing better she still states that there are "things" in her stool but she has not started a probiotic.  She has a history of irritable bowel syndrome she is afebrile with a normal white count and no weight loss or blood in her stool. The  Review of Systems  Constitutional: Negative for activity change, appetite change and fatigue.  HENT: Negative for ear pain, congestion, neck pain, postnasal drip and sinus pressure.   Eyes: Negative for redness and visual disturbance.  Respiratory: Negative for cough, shortness of breath and wheezing.   Gastrointestinal: Negative for abdominal pain and abdominal distention.  Genitourinary: Negative for dysuria, frequency and menstrual problem.  Musculoskeletal: Negative for myalgias, joint swelling and arthralgias.  Skin: Negative for rash and wound.  Neurological: Negative for dizziness, weakness and headaches.  Hematological: Negative for adenopathy. Does not bruise/bleed easily.  Psychiatric/Behavioral: Negative for sleep disturbance and decreased concentration.   Past Medical History  Diagnosis Date  . Arthritis   . Depression   . Allergy   . Asthma   . GERD (gastroesophageal reflux disease)   . Hypertension   . Hyperlipidemia   . Fibroids   . Diverticulosis of colon   . Blunt head trauma   . Premature ventricular contractions   . IBS (irritable bowel syndrome)    Past Surgical History  Procedure Date  . Breast surgery     fibroid tumors  . Dilation and curettage of uterus     reports that she has  quit smoking. She does not have any smokeless tobacco history on file. She reports that she drinks alcohol. Her drug history not on file. family history includes Cancer in her father; Coronary artery disease in an unspecified family member; and Heart disease in her mother. No Known Allergies       Objective:   Physical Exam  Constitutional: She is oriented to person, place, and time. She appears well-developed and well-nourished. No distress.  HENT:  Head: Normocephalic and atraumatic.  Right Ear: External ear normal.  Left Ear: External ear normal.  Nose: Nose normal.  Mouth/Throat: Oropharynx is clear and moist.  Eyes: Conjunctivae and EOM are normal. Pupils are equal, round, and reactive to light.  Neck: Normal range of motion. Neck supple. No JVD present. No tracheal deviation present. No thyromegaly present.  Cardiovascular: Normal rate, regular rhythm, normal heart sounds and intact distal pulses.   No murmur heard. Pulmonary/Chest: Effort normal and breath sounds normal. She has no wheezes. She exhibits no tenderness.  Abdominal: Soft. Bowel sounds are normal.  Musculoskeletal: Normal range of motion. She exhibits no edema and no tenderness.  Lymphadenopathy:    She has no cervical adenopathy.  Neurological: She is alert and oriented to person, place, and time. She has normal reflexes. No cranial nerve deficit.  Skin: Skin is warm and dry. She is not diaphoretic.  Psychiatric: She has a normal mood and affect. Her behavior is normal.          Assessment & Plan:  Probable urachal bowel syndrome with some rapid transit  and undigested food in her stool I doubt this represents a parasitic infection.  No eosinophilia seen in her blood count. I would treat her with a probiotic high-fiber diet increase fluids avoid greasy foods.  I believe we are safe to discontinue the Flagyl at this point her blood pressure stable on her current medications her menopausal symptoms are stable  we'll followup in 3 month

## 2010-07-09 NOTE — Patient Instructions (Signed)
Resume the fiber and add the probiotic

## 2010-10-09 ENCOUNTER — Other Ambulatory Visit: Payer: Self-pay | Admitting: Internal Medicine

## 2010-10-10 ENCOUNTER — Ambulatory Visit (INDEPENDENT_AMBULATORY_CARE_PROVIDER_SITE_OTHER): Payer: BC Managed Care – PPO | Admitting: Internal Medicine

## 2010-10-10 ENCOUNTER — Telehealth: Payer: Self-pay | Admitting: Internal Medicine

## 2010-10-10 VITALS — BP 130/80 | HR 76 | Temp 98.2°F | Resp 16 | Ht 69.0 in | Wt 175.0 lb

## 2010-10-10 DIAGNOSIS — K589 Irritable bowel syndrome without diarrhea: Secondary | ICD-10-CM

## 2010-10-10 DIAGNOSIS — M6740 Ganglion, unspecified site: Secondary | ICD-10-CM

## 2010-10-10 DIAGNOSIS — I10 Essential (primary) hypertension: Secondary | ICD-10-CM

## 2010-10-10 DIAGNOSIS — Z23 Encounter for immunization: Secondary | ICD-10-CM

## 2010-10-10 MED ORDER — DESVENLAFAXINE SUCCINATE ER 100 MG PO TB24: 100.0000 mg | ORAL_TABLET | Freq: Every day | ORAL | Status: DC

## 2010-10-10 MED ORDER — CARVEDILOL 12.5 MG PO TABS: 12.5000 mg | ORAL_TABLET | Freq: Two times a day (BID) | ORAL | Status: DC

## 2010-10-10 MED ORDER — AMLODIPINE-VALSARTAN-HCTZ 5-160-12.5 MG PO TABS: 1.0000 | ORAL_TABLET | Freq: Every day | ORAL | Status: DC

## 2010-10-10 NOTE — Telephone Encounter (Signed)
Please clarify directions for Amlodipine-Valsartan-HCTZ 5-160-12.5 MG TABS Pleas contact [facility]

## 2010-10-10 NOTE — Progress Notes (Signed)
Subjective:    Patient ID: Erica Marquez, female    DOB: Sep 30, 1950, 60 y.o.   MRN: [id number]  HPI Left sided abd pain and variable bowel movements ( loose to firm) Notes increased gas, fowl smell Pain is improved and is now "uncomfortable" IBS condition and history plays a role in the above complaints.  She also has history of uterine fibroids has had pelvic pain from the his anxiety which worsens her perception of these problems.  She is also noted today to have a ganglion cyst on her right wrist.  This ganglion cyst was drained in the past and has recurred  Review of Systems  Constitutional: Negative for activity change, appetite change and fatigue.  HENT: Negative for ear pain, congestion, neck pain, postnasal drip and sinus pressure.   Eyes: Negative for redness and visual disturbance.  Respiratory: Negative for cough, shortness of breath and wheezing.   Gastrointestinal: Negative for abdominal pain and abdominal distention.  Genitourinary: Negative for dysuria, frequency and menstrual problem.  Musculoskeletal: Negative for myalgias, joint swelling and arthralgias.  Skin: Negative for rash and wound.  Neurological: Negative for dizziness, weakness and headaches.  Hematological: Negative for adenopathy. Does not bruise/bleed easily.  Psychiatric/Behavioral: Negative for sleep disturbance and decreased concentration.   Past Medical History  Diagnosis Date  . Arthritis   . Depression   . Allergy   . Asthma   . GERD (gastroesophageal reflux disease)   . Hypertension   . Hyperlipidemia   . Fibroids   . Diverticulosis of colon   . Blunt head trauma   . Premature ventricular contractions   . IBS (irritable bowel syndrome)    Past Surgical History  Procedure Date  . Breast surgery     fibroid tumors  . Dilation and curettage of uterus     reports that she has quit smoking. She does not have any smokeless tobacco history on file. She reports that she drinks alcohol.  Her drug history not on file. family history includes Cancer in her father; Coronary artery disease in an unspecified family member; and Heart disease in her mother. No Known Allergies     Objective:   Physical Exam  Constitutional: She is oriented to person, place, and time. She appears well-developed and well-nourished. No distress.  HENT:  Head: Normocephalic and atraumatic.  Right Ear: External ear normal.  Left Ear: External ear normal.  Nose: Nose normal.  Mouth/Throat: Oropharynx is clear and moist.  Eyes: Conjunctivae and EOM are normal. Pupils are equal, round, and reactive to light.  Neck: Normal range of motion. Neck supple. No JVD present. No tracheal deviation present. No thyromegaly present.  Cardiovascular: Normal rate, regular rhythm, normal heart sounds and intact distal pulses.   No murmur heard. Pulmonary/Chest: Effort normal and breath sounds normal. She has no wheezes. She exhibits no tenderness.  Abdominal: Soft. Bowel sounds are normal.  Musculoskeletal: Normal range of motion. She exhibits no edema and no tenderness.  Lymphadenopathy:    She has no cervical adenopathy.  Neurological: She is alert and oriented to person, place, and time. She has normal reflexes. No cranial nerve deficit.  Skin: Skin is warm and dry. She is not diaphoretic.  Psychiatric: She has a normal mood and affect. Her behavior is normal.          Assessment & Plan:  The patient has increased symptoms of irritable bowel syndrome and possibly some acquired food intolerances.  We have asked her to limit gluten and CO2  gas improves and then to limit lactose or dairy products to see if the problems improve. We also talked with role of irritable bowel and some of her stomach and bowel complaints right now.  She is under increased financial and physical stress at this time.  Blood pressure is stable. Ganglion cyst was drained again but if it recurs a third time I would refer her to a hand  surgeon

## 2010-10-10 NOTE — Telephone Encounter (Signed)
Per dr Lovell Sheehan it is 1 pill qd

## 2010-10-10 NOTE — Patient Instructions (Signed)
yocon or yohimbin  One twice daily

## 2010-10-16 ENCOUNTER — Encounter: Payer: Self-pay | Admitting: Internal Medicine

## 2010-10-17 ENCOUNTER — Telehealth: Payer: Self-pay | Admitting: *Deleted

## 2010-10-17 NOTE — Telephone Encounter (Signed)
Pt would like to have RX for sleeping, and Ambien is too strong, and Benadryl does not work.

## 2010-10-20 MED ORDER — ESTAZOLAM 2 MG PO TABS: 2.0000 mg | ORAL_TABLET | Freq: Every day | ORAL | Status: AC

## 2010-10-20 NOTE — Telephone Encounter (Signed)
Call in prosom 2 mg take 1/2: number 30

## 2010-10-20 NOTE — Telephone Encounter (Signed)
See previous note.  Pt notified.

## 2010-11-27 ENCOUNTER — Telehealth: Payer: Self-pay | Admitting: *Deleted

## 2010-11-27 ENCOUNTER — Ambulatory Visit (INDEPENDENT_AMBULATORY_CARE_PROVIDER_SITE_OTHER): Payer: BC Managed Care – PPO | Admitting: Physician Assistant

## 2010-11-27 ENCOUNTER — Ambulatory Visit: Payer: BC Managed Care – PPO | Admitting: Physician Assistant

## 2010-11-27 ENCOUNTER — Encounter: Payer: Self-pay | Admitting: Physician Assistant

## 2010-11-27 DIAGNOSIS — I10 Essential (primary) hypertension: Secondary | ICD-10-CM

## 2010-11-27 DIAGNOSIS — R002 Palpitations: Secondary | ICD-10-CM

## 2010-11-27 LAB — CBC WITH DIFFERENTIAL/PLATELET
Basophils Relative: 0.8 % (ref 0.0–3.0)
Eosinophils Absolute: 0.1 10*3/uL (ref 0.0–0.7)
Eosinophils Relative: 1.1 % (ref 0.0–5.0)
Hemoglobin: 14 g/dL (ref 12.0–15.0)
Lymphocytes Relative: 35.3 % (ref 12.0–46.0)
MCHC: 33.7 g/dL (ref 30.0–36.0)
Neutro Abs: 3.1 10*3/uL (ref 1.4–7.7)
Neutrophils Relative %: 53.8 % (ref 43.0–77.0)
RBC: 4.74 Mil/uL (ref 3.87–5.11)
WBC: 5.8 10*3/uL (ref 4.5–10.5)

## 2010-11-27 LAB — BASIC METABOLIC PANEL
BUN: 14 mg/dL (ref 6–23)
CO2: 32 mEq/L (ref 19–32)
Chloride: 101 mEq/L (ref 96–112)
Creatinine, Ser: 0.7 mg/dL (ref 0.4–1.2)
Glucose, Bld: 96 mg/dL (ref 70–99)
Potassium: 3.9 mEq/L (ref 3.5–5.1)

## 2010-11-27 MED ORDER — CARVEDILOL 12.5 MG PO TABS: 18.7500 mg | ORAL_TABLET | Freq: Two times a day (BID) | ORAL | Status: DC

## 2010-11-27 NOTE — Telephone Encounter (Signed)
Pt states she feels her heart is beating abnormal for the past 2 weeks and wants to know whether to see her Cardiology of Dr. Lovell Sheehan.

## 2010-11-27 NOTE — Patient Instructions (Signed)
Your physician recommends that you schedule a follow-up appointment in: 1 month with Dr Graciela Husbands Your physician has recommended you make the following change in your medication: INCREASE Carvedilol to 1 1/2 tab twice daily Your physician recommends that you return for lab work in: today (BMP, TSH, CBC) Your physician has recommended that you wear an event monitor. Event monitors are medical devices that record the heart's electrical activity. Doctors most often [place] these monitors to diagnose arrhythmias. Arrhythmias are problems with the speed or rhythm of the heartbeat. The monitor is a small, portable device. You can wear one while you do your normal daily activities. This is usually used to diagnose what is causing palpitations/syncope (passing out). Please decrease you caffeine intake.

## 2010-11-27 NOTE — Assessment & Plan Note (Signed)
Patient's blood pressure is elevated today. We will increase her carvedilol which should also help with her palpitations.

## 2010-11-27 NOTE — Progress Notes (Signed)
HPI:  This is a 60 year old white female patient with history of RVOT PVCs. She also has hypertension and long history of palpitations. She had a cardiac catheterization in 2007 that showed normal LV function with no obstructive coronary disease. Last 2-D echo in 2003 showed normal LV function.  The patient comes in today complaining of 2 week history of worsening palpitations. She says it feels like a popcorn sensation all over her chest. The palpitations occur once or twice a day and last for 5-6 minutes. She says nothing has changed recently. She is taking amlodipine/valsartan/hydrochlorothiazide for hypertension for the past 6 months. She has had this popcorn sensation in the past that was abated after discontinuation of losartan.She does drink a cup of coffee and the morning and 3 cans a Tab in the afternoon. She does not exercise regularly and complains of increased fatigue.  No Known Allergies  Current Outpatient Prescriptions on File Prior to Visit  Medication Sig Dispense Refill  . Amlodipine-Valsartan-HCTZ 5-160-12.5 MG TABS Take 1 tablet by mouth daily. 1/2 tab daily  30 tablet  6  . budesonide-formoterol (SYMBICORT) 80-4.5 MCG/ACT inhaler Inhale 2 puffs into the lungs 2 (two) times daily.        . carvedilol (COREG) 12.5 MG tablet Take 1 tablet (12.5 mg total) by mouth 2 (two) times daily with a meal.  180 tablet  6  . desvenlafaxine (PRISTIQ) 100 MG 24 hr tablet Take 1 tablet (100 mg total) by mouth daily.  30 tablet  6  . LORazepam (ATIVAN) 0.5 MG tablet Take 1 tablet (0.5 mg total) by mouth at bedtime as needed.  30 tablet  5  . SYNTHROID 137 MCG tablet TAKE 1 TABLET EVERY DAY  90 tablet  3    Past Medical History  Diagnosis Date  . Arthritis   . Depression   . Allergy   . Asthma   . GERD (gastroesophageal reflux disease)   . Hypertension   . Hyperlipidemia   . Fibroids   . Diverticulosis of colon   . Blunt head trauma   . Premature ventricular contractions   . IBS  (irritable bowel syndrome)     Past Surgical History  Procedure Date  . Breast surgery     fibroid tumors  . Dilation and curettage of uterus     Family History  Problem Relation Age of Onset  . Coronary artery disease      fhx  . Heart disease Mother   . Cancer Father     GI ( stomach)    History   Social History  . Marital Status: Married    Spouse Name: N/A    Number of Children: N/A  . Years of Education: N/A   Occupational History  . Not on file.   Social History Main Topics  . Smoking status: Former Games developer  . Smokeless tobacco: Not on file  . Alcohol Use: Yes  . Drug Use:   . Sexually Active: Yes   Other Topics Concern  . Not on file   Social History Narrative  . No narrative on file    ROS: See HPI Eyes: Negative Ears:Negative for hearing loss, tinnitus Cardiovascular: Negative for chest pain, dyspnea, dyspnea on exertion, near-syncope, orthopnea, paroxysmal nocturnal dyspnia and syncope,edema, claudication, cyanosis,.  Respiratory:   Negative for cough, hemoptysis, shortness of breath, sleep disturbances due to breathing, sputum production and wheezing.   Endocrine: Negative for cold intolerance and heat intolerance.  Hematologic/Lymphatic: Negative for adenopathy and bleeding problem.  Does not bruise/bleed easily.  Musculoskeletal: Negative.   Gastrointestinal: Negative for nausea, vomiting, reflux, abdominal pain, diarrhea, constipation.   Neurological: Negative.  Allergic/Immunologic: Negative for environmental allergies.   PHYSICAL EXAM: Well-nournished, in no acute distress. Neck: No JVD, HJR, Bruit, or thyroid enlargement Lungs: No tachypnea, clear without wheezing, rales, or rhonchi Cardiovascular: RRR, PMI not displaced, heart sounds normal, no murmurs, gallops, bruit, thrill, or heave. Abdomen: BS normal. Soft without organomegaly, masses, lesions or tenderness. Extremities: without cyanosis, clubbing or edema. Good distal pulses  bilateral SKin: Warm, no lesions or rashes  Musculoskeletal: No deformities Neuro: no focal signs  BP 134/90  Pulse 72  Ht 5\' 9"  (1.753 m)  Wt 182 lb 6.4 oz (82.736 kg)  BMI 26.94 kg/m2  ZOX:WRUEAV sinus rhythm normal EKG

## 2010-11-27 NOTE — Telephone Encounter (Signed)
Per dr Lovell Sheehan- she should go to cardiologist and Left message on machine For pt

## 2010-11-27 NOTE — Assessment & Plan Note (Addendum)
Patient has had 2 week history of increasing palpitations. She has a history of RVOT PVCs. I've asked her to cut back on her caffeine, will check CBC, TSH, BMET. We will also put on event recorder and have her  follow up with Dr. Graciela Husbands. Increase Carvedilol.

## 2010-12-04 ENCOUNTER — Encounter (INDEPENDENT_AMBULATORY_CARE_PROVIDER_SITE_OTHER): Payer: BC Managed Care – PPO

## 2010-12-04 DIAGNOSIS — R002 Palpitations: Secondary | ICD-10-CM

## 2010-12-29 ENCOUNTER — Ambulatory Visit (INDEPENDENT_AMBULATORY_CARE_PROVIDER_SITE_OTHER): Payer: BC Managed Care – PPO | Admitting: Internal Medicine

## 2010-12-29 ENCOUNTER — Other Ambulatory Visit: Payer: Self-pay | Admitting: Obstetrics & Gynecology

## 2010-12-29 ENCOUNTER — Encounter: Payer: Self-pay | Admitting: Internal Medicine

## 2010-12-29 VITALS — BP 130/80 | HR 72 | Temp 98.2°F | Resp 16 | Ht 69.0 in | Wt 184.0 lb

## 2010-12-29 DIAGNOSIS — G4700 Insomnia, unspecified: Secondary | ICD-10-CM

## 2010-12-29 DIAGNOSIS — F329 Major depressive disorder, single episode, unspecified: Secondary | ICD-10-CM

## 2010-12-29 DIAGNOSIS — K5900 Constipation, unspecified: Secondary | ICD-10-CM

## 2010-12-29 DIAGNOSIS — I10 Essential (primary) hypertension: Secondary | ICD-10-CM

## 2010-12-29 DIAGNOSIS — K5909 Other constipation: Secondary | ICD-10-CM

## 2010-12-29 DIAGNOSIS — R109 Unspecified abdominal pain: Secondary | ICD-10-CM

## 2010-12-29 DIAGNOSIS — R1030 Lower abdominal pain, unspecified: Secondary | ICD-10-CM

## 2010-12-29 DIAGNOSIS — F32A Depression, unspecified: Secondary | ICD-10-CM

## 2010-12-29 MED ORDER — ESZOPICLONE 2 MG PO TABS
2.0000 mg | ORAL_TABLET | Freq: Every day | ORAL | Status: DC
Start: 2010-12-29 — End: 2011-01-21

## 2010-12-29 MED ORDER — AMLODIPINE-VALSARTAN-HCTZ 5-160-12.5 MG PO TABS: 1.0000 | ORAL_TABLET | Freq: Every day | ORAL | Status: DC

## 2010-12-29 MED ORDER — LORAZEPAM 0.5 MG PO TABS: 0.5000 mg | ORAL_TABLET | Freq: Every evening | ORAL | Status: DC | PRN

## 2010-12-29 NOTE — Patient Instructions (Signed)
The patient is instructed to continue all medications as prescribed. Schedule followup with check out clerk upon leaving the clinic  

## 2010-12-29 NOTE — Progress Notes (Signed)
Subjective:    Patient ID: Erica Marquez, female    DOB: November 29, 1950, 60 y.o.   MRN: [id number]  HPI  Increased fatigue but feels this is due to the increased BB. HR has improved with less palpitations. Has not been able to sleep she does not know if this is because of increased dose of beta blocker as this is secondary to worsening depression.  She has a history of atrial fibrillation that appears to be stable with the increased dose of the beta blocker.  She's tried Lunesta in the past which was effective for her insomnia but it is expensive for her medication reimbursement program.     Review of Systems  Constitutional: Negative for activity change, appetite change and fatigue.  HENT: Negative for ear pain, congestion, neck pain, postnasal drip and sinus pressure.   Eyes: Negative for redness and visual disturbance.  Respiratory: Negative for cough, shortness of breath and wheezing.   Gastrointestinal: Negative for abdominal pain and abdominal distention.  Genitourinary: Negative for dysuria, frequency and menstrual problem.  Musculoskeletal: Negative for myalgias, joint swelling and arthralgias.  Skin: Negative for rash and wound.  Neurological: Negative for dizziness, weakness and headaches.  Hematological: Negative for adenopathy. Does not bruise/bleed easily.  Psychiatric/Behavioral: Negative for sleep disturbance and decreased concentration.       Past Medical History  Diagnosis Date  . Arthritis   . Depression   . Allergy   . Asthma   . GERD (gastroesophageal reflux disease)   . Hypertension   . Hyperlipidemia   . Fibroids   . Diverticulosis of colon   . Blunt head trauma   . Premature ventricular contractions   . IBS (irritable bowel syndrome)     History   Social History  . Marital Status: Married    Spouse Name: N/A    Number of Children: N/A  . Years of Education: N/A   Occupational History  . Not on file.   Social History Main Topics  . Smoking  status: Former Games developer  . Smokeless tobacco: Not on file  . Alcohol Use: Yes  . Drug Use:   . Sexually Active: Yes   Other Topics Concern  . Not on file   Social History Narrative  . No narrative on file    Past Surgical History  Procedure Date  . Breast surgery     fibroid tumors  . Dilation and curettage of uterus     Family History  Problem Relation Age of Onset  . Coronary artery disease      fhx  . Heart disease Mother   . Cancer Father     GI ( stomach)    No Known Allergies  Current Outpatient Prescriptions on File Prior to Visit  Medication Sig Dispense Refill  . aspirin 81 MG tablet Take 81 mg by mouth daily.        . budesonide-formoterol (SYMBICORT) 80-4.5 MCG/ACT inhaler Inhale 2 puffs into the lungs 2 (two) times daily.        Marland Kitchen desvenlafaxine (PRISTIQ) 100 MG 24 hr tablet Take 1 tablet (100 mg total) by mouth daily.  30 tablet  6  . SYNTHROID 137 MCG tablet TAKE 1 TABLET EVERY DAY  90 tablet  3    BP 130/80  Pulse 72  Temp 98.2 F (36.8 C)  Resp 16  Ht 5\' 9"  (1.753 m)  Wt 184 lb (83.462 kg)  BMI 27.17 kg/m2    Objective:   Physical Exam  Vitals  reviewed. Constitutional: She is oriented to person, place, and time. She appears well-developed and well-nourished. No distress.       Weight gain  noted  HENT:  Head: Normocephalic and atraumatic.  Right Ear: External ear normal.  Left Ear: External ear normal.  Nose: Nose normal.  Mouth/Throat: Oropharynx is clear and moist.  Eyes: Conjunctivae and EOM are normal. Pupils are equal, round, and reactive to light.  Neck: Normal range of motion. Neck supple. No JVD present. No tracheal deviation present. No thyromegaly present.  Cardiovascular: Normal rate, normal heart sounds and intact distal pulses.   No murmur heard. Pulmonary/Chest: Effort normal and breath sounds normal. She has no wheezes. She exhibits no tenderness.  Abdominal: Soft. Bowel sounds are normal.  Musculoskeletal: Normal range  of motion. She exhibits no edema and no tenderness.  Lymphadenopathy:    She has no cervical adenopathy.  Neurological: She is alert and oriented to person, place, and time. She has normal reflexes. No cranial nerve deficit.  Skin: Skin is warm and dry. She is not diaphoretic.  Psychiatric: She has a normal mood and affect. Her behavior is normal.          Assessment & Plan:  Multifactorial fatigue blood pressure medications beta blocker history of asthma recent weight gain and a history of atrial fibrillation.  The disorder on pristiq 100 mg by mouth daily urge compliance with medication discussed counseling.  Weight gain on Synthroid monitor TSH discuss balanced diet high-protein fast and slow carbohydrates.  Insomnia discussed sleep hygiene and chronic use of Lunesta  increased fiber for constipation. The verapamil certainly predisposes her to constipation

## 2011-01-02 ENCOUNTER — Ambulatory Visit (INDEPENDENT_AMBULATORY_CARE_PROVIDER_SITE_OTHER): Payer: BC Managed Care – PPO | Admitting: Internal Medicine

## 2011-01-02 ENCOUNTER — Encounter: Payer: Self-pay | Admitting: Internal Medicine

## 2011-01-02 VITALS — BP 135/83 | HR 58 | Ht 69.0 in | Wt 183.0 lb

## 2011-01-02 DIAGNOSIS — I4949 Other premature depolarization: Secondary | ICD-10-CM

## 2011-01-02 DIAGNOSIS — I4891 Unspecified atrial fibrillation: Secondary | ICD-10-CM

## 2011-01-02 MED ORDER — VERAPAMIL HCL ER 180 MG PO TBCR: 180.0000 mg | EXTENDED_RELEASE_TABLET | Freq: Every day | ORAL | Status: DC

## 2011-01-02 MED ORDER — CARVEDILOL 6.25 MG PO TABS: ORAL_TABLET | ORAL | Status: DC

## 2011-01-02 MED ORDER — RIVAROXABAN 20 MG PO TABS: 20.0000 mg | ORAL_TABLET | Freq: Every day | ORAL | Status: DC

## 2011-01-02 NOTE — Assessment & Plan Note (Signed)
Symptomatic PVC detected on monitor

## 2011-01-02 NOTE — Progress Notes (Signed)
  HPI  Erica Marquez is a 60 y.o. female seen in followup for RVOT PVCs.She's also has a history of hypertension her last visit had problems with hypotension.  Her popcorn sensation has abated since we discontinue the losartan. her blood pressure however remains elevated.  catheterization 3 years ago demonstrated normal left ventricular function no obstructive coronary disease; echo cardiogram 2003 and also showed normal LV function  She ws seen by Herma Carson for increasing symtpoms for which she was started on a monitor   Past Medical History  Diagnosis Date  . Arthritis   . Depression   . Allergy   . Asthma   . GERD (gastroesophageal reflux disease)   . Hypertension   . Hyperlipidemia   . Fibroids   . Diverticulosis of colon   . Blunt head trauma   . Premature ventricular contractions   . IBS (irritable bowel syndrome)     Past Surgical History  Procedure Date  . Breast surgery     fibroid tumors  . Dilation and curettage of uterus     Current Outpatient Prescriptions  Medication Sig Dispense Refill  . Amlodipine-Valsartan-HCTZ 5-160-12.5 MG TABS Take 1 tablet by mouth daily.  90 tablet  3  . aspirin 81 MG tablet Take 81 mg by mouth daily.        . budesonide-formoterol (SYMBICORT) 80-4.5 MCG/ACT inhaler Inhale 2 puffs into the lungs 2 (two) times daily.        . carvedilol (COREG) 12.5 MG tablet Take 1.5 tablets (18.75 mg total) by mouth 2 (two) times daily with a meal.  270 tablet  2  . desvenlafaxine (PRISTIQ) 100 MG 24 hr tablet Take 1 tablet (100 mg total) by mouth daily.  30 tablet  6  . eszopiclone (LUNESTA) 2 MG TABS Take 1 tablet (2 mg total) by mouth at bedtime. Take immediately before bedtime  30 tablet  3  . SYNTHROID 137 MCG tablet TAKE 1 TABLET EVERY DAY  90 tablet  3    No Known Allergies  Review of Systems negative except from HPI and PMH  Physical Exam Well developed and well nourished in no acute distress HENT normal E scleral and icterus  clear Neck Supple JVP flat; carotids brisk and full Clear to ausculation Regular rate and rhythm, no murmurs gallops or rub Soft with active bowel sounds No clubbing cyanosis none Edema Alert and oriented, grossly normal motor and sensory function Skin Warm and Dry    Assessment and  Plan

## 2011-01-02 NOTE — Assessment & Plan Note (Signed)
The patient had asymptomatic atrial fibrillation detected on her monitor. It was my assumption that this would be correlated with her "popcorn sensation but it was not. Her thromboembolic risk profile is notable for gender and hypertension and   recommended long-term anticoagulation. We discussed risk benefits and will begin Rivaroxaban  She is not interested in antiarrhythmic therapy; hence, we will not utilize an event recorder (implantable) to clarify the mechanism of the popcorn sensation. We will rather pursue empiric therapy with calcium blockers and beta blockers. To that end we'll discontinue the carvedilol which has been associated with fatigue and we will begin verapamil after we weaned a former.

## 2011-01-02 NOTE — Patient Instructions (Signed)
Your physician has recommended you make the following change in your medication:  1) Start Xarelto 20mg  once daily. 2) Take coreg 12.5mg  twice daily for 5 days, then decrease to 6.25mg  twice daily for 5 days, then stop. 3) After stopping coreg, start verapamil 180mg  one tablet daily.  Your physician recommends that you schedule a follow-up appointment in: 6-8 weeks.

## 2011-01-09 ENCOUNTER — Ambulatory Visit: Payer: BC Managed Care – PPO | Admitting: Internal Medicine

## 2011-01-21 ENCOUNTER — Telehealth: Payer: Self-pay | Admitting: Internal Medicine

## 2011-01-21 DIAGNOSIS — G4700 Insomnia, unspecified: Secondary | ICD-10-CM

## 2011-01-21 MED ORDER — ESZOPICLONE 2 MG PO TABS: 2.0000 mg | ORAL_TABLET | Freq: Every day | ORAL | Status: DC

## 2011-01-21 NOTE — Telephone Encounter (Signed)
Pt has not filled her script for Alfonso Patten that was date 12/29/10 yet. Pt has rcvd a voucher to get a 7 night free trial of med, that will need to be accompanied by a script for 7 night from her pcp. Pls send to [facility] in [facility]

## 2011-01-21 NOTE — Telephone Encounter (Signed)
done

## 2011-01-21 NOTE — Telephone Encounter (Signed)
Done and [place] informed pt

## 2011-02-16 ENCOUNTER — Ambulatory Visit (INDEPENDENT_AMBULATORY_CARE_PROVIDER_SITE_OTHER): Payer: BC Managed Care – PPO | Admitting: Internal Medicine

## 2011-02-16 ENCOUNTER — Encounter: Payer: Self-pay | Admitting: Internal Medicine

## 2011-02-16 DIAGNOSIS — I4949 Other premature depolarization: Secondary | ICD-10-CM

## 2011-02-16 DIAGNOSIS — I10 Essential (primary) hypertension: Secondary | ICD-10-CM

## 2011-02-16 DIAGNOSIS — I4891 Unspecified atrial fibrillation: Secondary | ICD-10-CM

## 2011-02-16 NOTE — Assessment & Plan Note (Signed)
Blood pressure is very well controlled currently. We will discontinue her amlodipine combination and use verapamil as it is cheaper. She'll follow up with Dr. Lovell Sheehan about this. She is home monitors which she will use.

## 2011-02-16 NOTE — Assessment & Plan Note (Addendum)
I unfortunately do not have the records to clarify atrial fibrillation and is associated with a "popcorn sensation". We did discuss the prognosis however he can fibrillation and that the major impact we can have on outcome is related to anticoagulation which she is currently taking with Rivaroxaban Hence, we will not pursue further more aggressive therapy as reassurance was very satisfying for her in this regard, at least for now  I've asked for full disclosure on the event recorder. What I have received is the first 10 days, but I am told that there is nothing more. This is perplexing but consistent with the fact that on the last pager the recording is a bunch of handwriting that I had made, unfortunately I do not see evidence of atrial fibrillation on the monitor.   i will plan on reviewing this with the patient and I think the best thing to do is to stop the Rivaroxaban unless we can find ruther info

## 2011-02-16 NOTE — Progress Notes (Signed)
  HPI  Erica Marquez is a 61 y.o. female seen in followup for RVOT PVCs.She's also has a history of hypertension her last visit had problems with hypotension.  Her popcorn sensation has abated since we discontinue the losartan. There are conflicting notes regarding popcorn sensations and atrial fibrillation. We have to get the old monitor strips to clarify. Currently her blood pressure well controlled. She's having some unusual sharp twinges in her right chest that lasts just seconds.  She has noted some shortness of breath at rest but not with activity. She noted after she worked out at [employer] where she had appropriate heart rate but post exercise her blood pressure was 100 and her heart rate was quite slow. Interestingly this is unassociated with symptoms of lightheadedness     Past Medical History  Diagnosis Date  . Arthritis   . Depression   . Allergy   . Asthma   . GERD (gastroesophageal reflux disease)   . Hypertension   . Hyperlipidemia   . Fibroids   . Diverticulosis of colon   . Blunt head trauma   . Premature ventricular contractions   . IBS (irritable bowel syndrome)     Past Surgical History  Procedure Date  . Breast surgery     fibroid tumors  . Dilation and curettage of uterus     Current Outpatient Prescriptions  Medication Sig Dispense Refill  . Amlodipine-Valsartan-HCTZ 5-160-12.5 MG TABS Take 1 tablet by mouth daily.  90 tablet  3  . budesonide-formoterol (SYMBICORT) 80-4.5 MCG/ACT inhaler Inhale 2 puffs into the lungs 2 (two) times daily as needed.       . desvenlafaxine (PRISTIQ) 100 MG 24 hr tablet Take 1 tablet (100 mg total) by mouth daily.  30 tablet  6  . fexofenadine (ALLEGRA) 180 MG tablet Take 180 mg by mouth daily.      . Probiotic Product (ALIGN PO) Take by mouth daily.      . Psyllium (METAMUCIL PO) Take by mouth daily.      . Rivaroxaban 20 MG TABS Take 20 mg by mouth daily.  30 tablet  6  . SYNTHROID 137 MCG tablet TAKE 1 TABLET EVERY DAY   90 tablet  3  . verapamil (CALAN-SR) 180 MG CR tablet Take 1 tablet (180 mg total) by mouth at bedtime.  30 tablet  6    No Known Allergies  Review of Systems negative except from HPI and PMH  Physical Exam BP 108/66  Pulse 70  Ht 5\' 9"  (1.753 m)  Wt 185 lb (83.915 kg)  BMI 27.32 kg/m2 Well developed and well nourished in no acute distress HENT normal E scleral and icterus clear Neck Supple JVP flat; carotids brisk and full Clear to ausculation Regular rate and rhythm, no murmurs gallops or rub Soft with active bowel sounds No clubbing cyanosis none Edema Alert and oriented, grossly normal motor and sensory function Skin Warm and Dry   Assessment and  Plan

## 2011-02-16 NOTE — Patient Instructions (Signed)
Your physician wants you to follow-up in:  12 months.  You will receive a reminder letter in the mail two months in advance. If you don't receive a letter, please call our office to schedule the follow-up appointment.   

## 2011-02-17 ENCOUNTER — Encounter: Payer: Self-pay | Admitting: Internal Medicine

## 2011-02-19 ENCOUNTER — Telehealth: Payer: Self-pay | Admitting: Internal Medicine

## 2011-02-19 NOTE — Telephone Encounter (Signed)
I will forward this message to Dr. Graciela Husbands to review. I am not certain if this is just a monitor issue?

## 2011-02-19 NOTE — Telephone Encounter (Signed)
New Problem   Patient was instructed to return call to nurse if she had not heard back from Dr. Graciela Husbands.  Please return call to patient at hm#

## 2011-02-20 NOTE — Assessment & Plan Note (Signed)
stable °

## 2011-03-02 NOTE — Telephone Encounter (Signed)
F/U   Patient is calling to f/u with Dr. Graciela Husbands., requesting phone call on  Cell # [phone] today.  Patient needs to know if she needs to make an appnt to come back into the office regarding monitor results.

## 2011-03-03 NOTE — Telephone Encounter (Signed)
Not on MD desktop. Forwarding back to MD.

## 2011-03-03 NOTE — Telephone Encounter (Signed)
I spoke with Erica Marquez and requested the monitor be reprinted.

## 2011-03-03 NOTE — Telephone Encounter (Signed)
Spoke with Pt   i can not understand where the documentation of afib is which i described to her and she remembers me showing her  We will print the record a third time and see what it shows. We may pursue remonitoring to try and identify afib but in hte absence of documentation i have suggested we hold the  Rivaroxaban

## 2011-03-10 NOTE — Telephone Encounter (Signed)
Late entry: Per Dr. Graciela Husbands. He reviewed the patient's monitor again and spoke with her directly. No evidence a-fib on monitor. No indication for anticoagulation at this time.

## 2011-03-12 ENCOUNTER — Encounter: Payer: Self-pay | Admitting: Internal Medicine

## 2011-03-19 ENCOUNTER — Telehealth: Payer: Self-pay | Admitting: Internal Medicine

## 2011-03-19 NOTE — Telephone Encounter (Signed)
noted 

## 2011-03-19 NOTE — Telephone Encounter (Signed)
FYI: Patient is coming in for consult and Ck Up on 03/26/11 Thursday.  Please have notes from holter monitor available for clarity per patient request.  She has several question she would like to ask at the appnt. No need for return call.

## 2011-03-26 ENCOUNTER — Ambulatory Visit (INDEPENDENT_AMBULATORY_CARE_PROVIDER_SITE_OTHER): Payer: BC Managed Care – PPO | Admitting: Internal Medicine

## 2011-03-26 ENCOUNTER — Encounter: Payer: Self-pay | Admitting: Internal Medicine

## 2011-03-26 DIAGNOSIS — G479 Sleep disorder, unspecified: Secondary | ICD-10-CM

## 2011-03-26 DIAGNOSIS — I4949 Other premature depolarization: Secondary | ICD-10-CM

## 2011-03-26 DIAGNOSIS — I4891 Unspecified atrial fibrillation: Secondary | ICD-10-CM

## 2011-03-26 DIAGNOSIS — G478 Other sleep disorders: Secondary | ICD-10-CM

## 2011-03-26 DIAGNOSIS — I10 Essential (primary) hypertension: Secondary | ICD-10-CM

## 2011-03-26 NOTE — Assessment & Plan Note (Signed)
I can find no doumentation of the monitor referred to above  Will follow  And look into monitoring through the iPhone

## 2011-03-26 NOTE — Assessment & Plan Note (Addendum)
-   Continue verapamil 

## 2011-03-26 NOTE — Assessment & Plan Note (Signed)
Have asked that she discuss this with Dr Lovell Sheehan next week and consider sleep referral

## 2011-03-26 NOTE — Progress Notes (Addendum)
  HPI  Erica Marquez is a 61 y.o. female seen in followup for RVOT PVCs.She's also has a history of hypertension her last visit had problems with hypotension.  Her popcorn sensation has abated since we discontinue the losartan.   There are conflicting notes regarding popcorn sensations and atrial fibrillation. After reviewing all that i could find with her name on it i could find nothing with atrial fibrillation-- i dont know how I made the error  She complains of significant fatigue but only sleeps abotu 3-4 hrs per night, this is new over the last year        Past Medical History  Diagnosis Date  . Arthritis   . Depression   . Allergy   . Asthma   . GERD (gastroesophageal reflux disease)   . Hypertension   . Hyperlipidemia   . Fibroids   . Diverticulosis of colon   . Blunt head trauma   . Premature ventricular contractions   . IBS (irritable bowel syndrome)     Past Surgical History  Procedure Date  . Breast surgery     fibroid tumors  . Dilation and curettage of uterus     Current Outpatient Prescriptions  Medication Sig Dispense Refill  . Amlodipine-Valsartan-HCTZ 5-160-12.5 MG TABS Take 1 tablet by mouth daily.  90 tablet  3  . budesonide-formoterol (SYMBICORT) 80-4.5 MCG/ACT inhaler Inhale 2 puffs into the lungs 2 (two) times daily as needed.       . desvenlafaxine (PRISTIQ) 100 MG 24 hr tablet Take 1 tablet (100 mg total) by mouth daily.  30 tablet  6  . fexofenadine (ALLEGRA) 180 MG tablet Take 180 mg by mouth daily.      . Probiotic Product (ALIGN PO) Take by mouth daily.      . Psyllium (METAMUCIL PO) Take by mouth daily.      Marland Kitchen SYNTHROID 137 MCG tablet TAKE 1 TABLET EVERY DAY  90 tablet  3  . verapamil (CALAN-SR) 180 MG CR tablet Take 1 tablet (180 mg total) by mouth at bedtime.  30 tablet  6    No Known Allergies  Review of Systems negative except from HPI and PMH  Physical Exam BP 124/80  Pulse 61  Wt 187 lb (84.823 kg) Well developed and well  nourished in no acute distress HENT normal E scleral and icterus clear Neck Supple JVP flat; carotids brisk and full Clear to ausculation Regular rate and rhythm, no murmurs gallops or rub Soft with active bowel sounds No clubbing cyanosis none Edema Alert and oriented, grossly normal motor and sensory function Skin Warm and Dry   Assessment and  Plan Ecg  NSR @ 61 .14/.08./.422

## 2011-03-26 NOTE — Assessment & Plan Note (Signed)
We will stop her exforge b/c of cost considerations  Resumption of HCTZ or generic cozaar would be cheaper and hopefully efffective

## 2011-03-30 ENCOUNTER — Ambulatory Visit: Payer: BC Managed Care – PPO | Admitting: Internal Medicine

## 2011-03-31 ENCOUNTER — Ambulatory Visit (INDEPENDENT_AMBULATORY_CARE_PROVIDER_SITE_OTHER): Payer: BC Managed Care – PPO | Admitting: Internal Medicine

## 2011-03-31 ENCOUNTER — Encounter: Payer: Self-pay | Admitting: Internal Medicine

## 2011-03-31 VITALS — BP 130/80 | HR 72 | Temp 98.2°F | Resp 16 | Ht 69.0 in | Wt 185.0 lb

## 2011-03-31 DIAGNOSIS — I10 Essential (primary) hypertension: Secondary | ICD-10-CM

## 2011-03-31 DIAGNOSIS — G479 Sleep disorder, unspecified: Secondary | ICD-10-CM

## 2011-03-31 DIAGNOSIS — I4891 Unspecified atrial fibrillation: Secondary | ICD-10-CM

## 2011-03-31 DIAGNOSIS — G478 Other sleep disorders: Secondary | ICD-10-CM

## 2011-03-31 MED ORDER — AZITHROMYCIN 250 MG PO TABS: ORAL_TABLET | ORAL | Status: AC

## 2011-03-31 MED ORDER — HYDROCHLOROTHIAZIDE 25 MG PO TABS: 12.5000 mg | ORAL_TABLET | Freq: Every day | ORAL | Status: DC

## 2011-03-31 MED ORDER — CLONAZEPAM 1 MG PO TABS: 1.0000 mg | ORAL_TABLET | Freq: Two times a day (BID) | ORAL | Status: DC | PRN

## 2011-03-31 MED ORDER — VERAPAMIL HCL ER 240 MG PO TBCR: 240.0000 mg | EXTENDED_RELEASE_TABLET | Freq: Every day | ORAL | Status: DC

## 2011-03-31 NOTE — Progress Notes (Signed)
Subjective:    Patient ID: Erica Marquez, female    DOB: 06/28/1950, 61 y.o.   MRN: [id number]  HPI  Weight gain due to stress and not sleeping. The lunesta has failed. She has taken clonazapam and this helps.  Review of Systems  Constitutional: Negative for activity change, appetite change and fatigue.  HENT: Negative for ear pain, congestion, neck pain, postnasal drip and sinus pressure.   Eyes: Negative for redness and visual disturbance.  Respiratory: Negative for cough, shortness of breath and wheezing.   Gastrointestinal: Negative for abdominal pain and abdominal distention.  Genitourinary: Negative for dysuria, frequency and menstrual problem.  Musculoskeletal: Negative for myalgias, joint swelling and arthralgias.  Skin: Negative for rash and wound.  Neurological: Negative for dizziness, weakness and headaches.  Hematological: Negative for adenopathy. Does not bruise/bleed easily.  Psychiatric/Behavioral: Negative for sleep disturbance and decreased concentration.   Past Medical History  Diagnosis Date  . Arthritis   . Depression   . Allergy   . Asthma   . GERD (gastroesophageal reflux disease)   . Hypertension   . Hyperlipidemia   . Fibroids   . Diverticulosis of colon   . Blunt head trauma   . Premature ventricular contractions   . IBS (irritable bowel syndrome)     History   Social History  . Marital Status: Married    Spouse Name: N/A    Number of Children: N/A  . Years of Education: N/A   Occupational History  . Not on file.   Social History Main Topics  . Smoking status: Former Games developer  . Smokeless tobacco: Not on file  . Alcohol Use: Yes  . Drug Use:   . Sexually Active: Yes   Other Topics Concern  . Not on file   Social History Narrative  . No narrative on file    Past Surgical History  Procedure Date  . Breast surgery     fibroid tumors  . Dilation and curettage of uterus     Family History  Problem Relation Age of Onset  .  Coronary artery disease      fhx  . Heart disease Mother   . Cancer Father     GI ( stomach)    No Known Allergies  Current Outpatient Prescriptions on File Prior to Visit  Medication Sig Dispense Refill  . budesonide-formoterol (SYMBICORT) 80-4.5 MCG/ACT inhaler Inhale 2 puffs into the lungs 2 (two) times daily as needed.       . desvenlafaxine (PRISTIQ) 100 MG 24 hr tablet Take 1 tablet (100 mg total) by mouth daily.  30 tablet  6  . fexofenadine (ALLEGRA) 180 MG tablet Take 180 mg by mouth daily.      . Probiotic Product (ALIGN PO) Take by mouth daily.      . Psyllium (METAMUCIL PO) Take by mouth daily.      Marland Kitchen SYNTHROID 137 MCG tablet TAKE 1 TABLET EVERY DAY  90 tablet  3  . verapamil (CALAN-SR) 180 MG CR tablet Take 1 tablet (180 mg total) by mouth at bedtime.  30 tablet  6    BP 130/80  Pulse 72  Temp 98.2 F (36.8 C)  Resp 16  Ht 5\' 9"  (1.753 m)  Wt 185 lb (83.915 kg)  BMI 27.32 kg/m2       Objective:   Physical Exam  Nursing note and vitals reviewed. Constitutional: She is oriented to person, place, and time. She appears well-developed and well-nourished. No distress.  HENT:  Head: Normocephalic and atraumatic.  Right Ear: External ear normal.  Left Ear: External ear normal.  Nose: Nose normal.  Mouth/Throat: Oropharynx is clear and moist.  Eyes: Conjunctivae and EOM are normal. Pupils are equal, round, and reactive to light.  Neck: Normal range of motion. Neck supple. No JVD present. No tracheal deviation present. No thyromegaly present.  Cardiovascular: Normal rate, regular rhythm, normal heart sounds and intact distal pulses.   No murmur heard. Pulmonary/Chest: Effort normal and breath sounds normal. She has no wheezes. She exhibits no tenderness.  Abdominal: Soft. Bowel sounds are normal.  Musculoskeletal: Normal range of motion. She exhibits no edema and no tenderness.  Lymphadenopathy:    She has no cervical adenopathy.  Neurological: She is alert and  oriented to person, place, and time. She has normal reflexes. No cranial nerve deficit.  Skin: Skin is warm and dry. She is not diaphoretic.  Psychiatric: She has a normal mood and affect. Her behavior is normal.          Assessment & Plan:  The pt has stress and anxiety with resultant insomnia The pt has been seeing cardiology and has been on verapamil for rate control The exforge is in 1/2

## 2011-03-31 NOTE — Progress Notes (Signed)
Subjective:    Patient ID: Erica Marquez, female    DOB: [birth year], 61 y.o.   MRN: [id number]  HPI    Review of Systems  Constitutional: Negative for activity change, appetite change and fatigue.  HENT: Negative for ear pain, congestion, neck pain, postnasal drip and sinus pressure.   Eyes: Negative for redness and visual disturbance.  Respiratory: Negative for cough, shortness of breath and wheezing.   Gastrointestinal: Negative for abdominal pain and abdominal distention.  Genitourinary: Negative for dysuria, frequency and menstrual problem.  Musculoskeletal: Negative for myalgias, joint swelling and arthralgias.  Skin: Negative for rash and wound.  Neurological: Negative for dizziness, weakness and headaches.  Hematological: Negative for adenopathy. Does not bruise/bleed easily.  Psychiatric/Behavioral: Positive for dysphoric mood. Negative for sleep disturbance and decreased concentration.   Past Medical History  Diagnosis Date  . Arthritis   . Depression   . Allergy   . Asthma   . GERD (gastroesophageal reflux disease)   . Hypertension   . Hyperlipidemia   . Fibroids   . Diverticulosis of colon   . Blunt head trauma   . Premature ventricular contractions   . IBS (irritable bowel syndrome)     History   Social History  . Marital Status: Married    Spouse Name: N/A    Number of Children: N/A  . Years of Education: N/A   Occupational History  . Not on file.   Social History Main Topics  . Smoking status: Former Games developer  . Smokeless tobacco: Not on file  . Alcohol Use: Yes  . Drug Use:   . Sexually Active: Yes   Other Topics Concern  . Not on file   Social History Narrative  . No narrative on file    Past Surgical History  Procedure Date  . Breast surgery     fibroid tumors  . Dilation and curettage of uterus     Family History  Problem Relation Age of Onset  . Coronary artery disease      fhx  . Heart disease Mother   . Cancer Father    GI ( stomach)    No Known Allergies  Current Outpatient Prescriptions on File Prior to Visit  Medication Sig Dispense Refill  . budesonide-formoterol (SYMBICORT) 80-4.5 MCG/ACT inhaler Inhale 2 puffs into the lungs 2 (two) times daily as needed.       . desvenlafaxine (PRISTIQ) 100 MG 24 hr tablet Take 1 tablet (100 mg total) by mouth daily.  30 tablet  6  . fexofenadine (ALLEGRA) 180 MG tablet Take 180 mg by mouth daily.      . Probiotic Product (ALIGN PO) Take by mouth daily.      . Psyllium (METAMUCIL PO) Take by mouth daily.      Marland Kitchen SYNTHROID 137 MCG tablet TAKE 1 TABLET EVERY DAY  90 tablet  3    BP 130/80  Pulse 72  Temp 98.2 F (36.8 C)  Resp 16  Ht 5\' 9"  (1.753 m)  Wt 185 lb (83.915 kg)  BMI 27.32 kg/m2       Objective:   Physical Exam  Nursing note and vitals reviewed. Constitutional: She is oriented to person, place, and time. She appears well-developed and well-nourished. No distress.  HENT:  Head: Normocephalic and atraumatic.  Right Ear: External ear normal.  Left Ear: External ear normal.  Nose: Nose normal.  Mouth/Throat: Oropharynx is clear and moist.  Eyes: Conjunctivae and EOM are normal. Pupils are equal, round,  and reactive to light.  Neck: Normal range of motion. Neck supple. No JVD present. No tracheal deviation present. No thyromegaly present.  Cardiovascular: Normal rate, regular rhythm, normal heart sounds and intact distal pulses.   No murmur heard. Pulmonary/Chest: Effort normal and breath sounds normal. She has no wheezes. She exhibits no tenderness.  Abdominal: Soft. Bowel sounds are normal.  Musculoskeletal: Normal range of motion. She exhibits no edema and no tenderness.  Lymphadenopathy:    She has no cervical adenopathy.  Neurological: She is alert and oriented to person, place, and time. She has normal reflexes. No cranial nerve deficit.  Skin: Skin is warm and dry. She is not diaphoretic.  Psychiatric: She has a normal mood and affect.  Her behavior is normal.          Assessment & Plan:  Change blood pressure medications to control palpitaitons with the verapamil. Add HCTZ an stop the amlodipine and benicar Follow up in 1 months  clonopin for sleep

## 2011-03-31 NOTE — Patient Instructions (Signed)
The patient is instructed to continue all medications as prescribed. Schedule followup with check out clerk upon leaving the clinic  

## 2011-05-01 ENCOUNTER — Encounter: Payer: Self-pay | Admitting: Internal Medicine

## 2011-05-01 ENCOUNTER — Ambulatory Visit (INDEPENDENT_AMBULATORY_CARE_PROVIDER_SITE_OTHER): Payer: BC Managed Care – PPO | Admitting: Internal Medicine

## 2011-05-01 VITALS — BP 140/80 | HR 76 | Temp 98.2°F | Resp 16 | Ht 69.0 in | Wt 184.0 lb

## 2011-05-01 DIAGNOSIS — I10 Essential (primary) hypertension: Secondary | ICD-10-CM

## 2011-05-01 DIAGNOSIS — M542 Cervicalgia: Secondary | ICD-10-CM

## 2011-05-01 DIAGNOSIS — E039 Hypothyroidism, unspecified: Secondary | ICD-10-CM

## 2011-05-01 MED ORDER — CYCLOBENZAPRINE HCL 10 MG PO TABS: 10.0000 mg | ORAL_TABLET | Freq: Every evening | ORAL | Status: AC | PRN

## 2011-05-01 MED ORDER — LOSARTAN POTASSIUM-HCTZ 50-12.5 MG PO TABS: 1.0000 | ORAL_TABLET | Freq: Every day | ORAL | Status: DC

## 2011-05-01 NOTE — Patient Instructions (Addendum)
Take the Hyzaar in the morning and continue to take the verapamil at night  Place an ice pack put on your neck for 15 minutes and then put a hot pack on your  neck

## 2011-05-22 ENCOUNTER — Telehealth: Payer: Self-pay | Admitting: *Deleted

## 2011-05-22 MED ORDER — AZITHROMYCIN 250 MG PO TABS: ORAL_TABLET | ORAL | Status: AC

## 2011-05-22 NOTE — Telephone Encounter (Signed)
Per dr Lovell Sheehan- mat take

## 2011-05-22 NOTE — Telephone Encounter (Signed)
Notified pt.  Pt is on Tobramycin-Dexameth [employer] for conjunctivitis.  Any problem with the Zpack?

## 2011-05-22 NOTE — Telephone Encounter (Signed)
Per dr jenkins-may have z pack 

## 2011-05-22 NOTE — Telephone Encounter (Signed)
Notified pt of Dr. Jenkins recommendations. 

## 2011-05-22 NOTE — Telephone Encounter (Signed)
Pt is asking for Dr. Lovell Sheehan to call in RX for URI that has moved to chest, and is causing an annoying dry cough.

## 2011-06-01 ENCOUNTER — Telehealth: Payer: Self-pay | Admitting: Family Medicine

## 2011-06-01 MED ORDER — MOXIFLOXACIN HCL 400 MG PO TABS: 400.0000 mg | ORAL_TABLET | Freq: Every day | ORAL | Status: DC

## 2011-06-01 NOTE — Telephone Encounter (Signed)
Pulled from Triage vmail. Pt received 5 day Zpack from Dr. Shela Commons on 5/3. Right after she finished with it, her symptoms returned. Still has croupy cough, etc. Please advise.

## 2011-06-01 NOTE — Telephone Encounter (Signed)
May have avelox 400 qd for 7 days per dr Lovell Sheehan

## 2011-06-04 ENCOUNTER — Telehealth: Payer: Self-pay | Admitting: *Deleted

## 2011-06-04 NOTE — Telephone Encounter (Signed)
Received call from pt stating she has developed stomach cramping and tingling/numbness in her fingers after taking Avelox for 2 days. States she stopped abx and symptoms have improved. Still has cough. Please advise if an alternate abx would be appropriate.

## 2011-06-05 ENCOUNTER — Other Ambulatory Visit: Payer: Self-pay | Admitting: *Deleted

## 2011-06-05 MED ORDER — CEFUROXIME AXETIL 250 MG PO TABS: 250.0000 mg | ORAL_TABLET | Freq: Two times a day (BID) | ORAL | Status: AC

## 2011-06-05 NOTE — Telephone Encounter (Signed)
Left message on machine.

## 2011-06-05 NOTE — Telephone Encounter (Signed)
ceftin 250 po BID x 7 days with food

## 2011-06-19 ENCOUNTER — Ambulatory Visit (INDEPENDENT_AMBULATORY_CARE_PROVIDER_SITE_OTHER): Payer: BC Managed Care – PPO | Admitting: Internal Medicine

## 2011-06-19 VITALS — BP 136/78 | HR 72 | Temp 98.3°F | Resp 16 | Ht 69.0 in | Wt 184.0 lb

## 2011-06-19 DIAGNOSIS — M542 Cervicalgia: Secondary | ICD-10-CM

## 2011-06-19 DIAGNOSIS — E039 Hypothyroidism, unspecified: Secondary | ICD-10-CM

## 2011-06-19 DIAGNOSIS — R05 Cough: Secondary | ICD-10-CM

## 2011-06-19 DIAGNOSIS — R059 Cough, unspecified: Secondary | ICD-10-CM

## 2011-06-19 DIAGNOSIS — I10 Essential (primary) hypertension: Secondary | ICD-10-CM

## 2011-06-19 MED ORDER — BENZONATATE 100 MG PO CAPS: 100.0000 mg | ORAL_CAPSULE | Freq: Three times a day (TID) | ORAL | Status: AC | PRN

## 2011-06-19 NOTE — Patient Instructions (Signed)
Practical paleo 

## 2011-06-25 ENCOUNTER — Telehealth: Payer: Self-pay | Admitting: Family Medicine

## 2011-06-25 ENCOUNTER — Other Ambulatory Visit: Payer: Self-pay | Admitting: Internal Medicine

## 2011-06-25 NOTE — Telephone Encounter (Signed)
noted 

## 2011-06-25 NOTE — Telephone Encounter (Signed)
We received notification that this pt did not have the chest or spine xray done, ordered 06/19/11.

## 2011-06-26 ENCOUNTER — Ambulatory Visit (INDEPENDENT_AMBULATORY_CARE_PROVIDER_SITE_OTHER)
Admission: RE | Admit: 2011-06-26 | Discharge: 2011-06-26 | Disposition: A | Discharge status: Home / Self Care | Payer: BC Managed Care – PPO | Source: Ambulatory Visit | Attending: Internal Medicine | Admitting: Internal Medicine

## 2011-06-26 DIAGNOSIS — R05 Cough: Secondary | ICD-10-CM

## 2011-06-26 DIAGNOSIS — M542 Cervicalgia: Secondary | ICD-10-CM

## 2011-06-26 DIAGNOSIS — R059 Cough, unspecified: Secondary | ICD-10-CM

## 2011-07-08 ENCOUNTER — Other Ambulatory Visit: Payer: Self-pay | Admitting: *Deleted

## 2011-07-08 MED ORDER — LORAZEPAM 0.5 MG PO TABS: 0.5000 mg | ORAL_TABLET | Freq: Every evening | ORAL | Status: AC | PRN

## 2011-07-17 ENCOUNTER — Telehealth: Payer: Self-pay | Admitting: Internal Medicine

## 2011-07-17 NOTE — Telephone Encounter (Signed)
Per dr Lovell Sheehan- labs ok and xray shows arthritis and spurs in the neck- can try pt first and if that doesn't work may want to go for injection

## 2011-07-17 NOTE — Telephone Encounter (Signed)
Patient is calling regards to Thyroid lab results and Xrays done Jun 19, 2011.  Patient has not heard from physician or staff.  (Reviewed and noted Degenerative changes Cervical spine and Abnormal T3 results). Advised patient I would send request to office.

## 2011-08-11 ENCOUNTER — Other Ambulatory Visit: Payer: Self-pay | Admitting: Internal Medicine

## 2011-09-09 ENCOUNTER — Encounter: Payer: Self-pay | Admitting: Internal Medicine

## 2011-09-09 ENCOUNTER — Ambulatory Visit (INDEPENDENT_AMBULATORY_CARE_PROVIDER_SITE_OTHER): Payer: BC Managed Care – PPO | Admitting: Internal Medicine

## 2011-09-09 VITALS — BP 130/76 | HR 72 | Temp 99.0°F | Wt 186.0 lb

## 2011-09-09 DIAGNOSIS — M8580 Other specified disorders of bone density and structure, unspecified site: Secondary | ICD-10-CM

## 2011-09-09 DIAGNOSIS — I10 Essential (primary) hypertension: Secondary | ICD-10-CM

## 2011-09-09 DIAGNOSIS — G4700 Insomnia, unspecified: Secondary | ICD-10-CM

## 2011-09-09 DIAGNOSIS — R635 Abnormal weight gain: Secondary | ICD-10-CM

## 2011-09-09 DIAGNOSIS — M899 Disorder of bone, unspecified: Secondary | ICD-10-CM

## 2011-09-09 MED ORDER — QUETIAPINE FUMARATE 25 MG PO TABS: 12.5000 mg | ORAL_TABLET | Freq: Every day | ORAL | Status: DC

## 2011-09-09 NOTE — Progress Notes (Signed)
Subjective:    Patient ID: Erica Marquez, female    DOB: Mar 09, 1950, 61 y.o.   MRN: [id number]  HPI  discussion of weight reviewed insomnia and sleep hygene HTN stable Mood stable     Review of Systems  Constitutional: Negative for activity change, appetite change and fatigue.  HENT: Negative for ear pain, congestion, neck pain, postnasal drip and sinus pressure.   Eyes: Negative for redness and visual disturbance.  Respiratory: Negative for cough, shortness of breath and wheezing.   Gastrointestinal: Negative for abdominal pain and abdominal distention.  Genitourinary: Negative for dysuria, frequency and menstrual problem.  Musculoskeletal: Negative for myalgias, joint swelling and arthralgias.  Skin: Negative for rash and wound.  Neurological: Negative for dizziness, weakness and headaches.  Hematological: Negative for adenopathy. Does not bruise/bleed easily.  Psychiatric/Behavioral: Negative for disturbed wake/sleep cycle and decreased concentration.   Past Medical History  Diagnosis Date  . Arthritis   . Depression   . Allergy   . Asthma   . GERD (gastroesophageal reflux disease)   . Hypertension   . Hyperlipidemia   . Fibroids   . Diverticulosis of colon   . Blunt head trauma   . Premature ventricular contractions   . IBS (irritable bowel syndrome)     History   Social History  . Marital Status: Married    Spouse Name: N/A    Number of Children: N/A  . Years of Education: N/A   Occupational History  . Not on file.   Social History Main Topics  . Smoking status: Former Games developer  . Smokeless tobacco: Not on file  . Alcohol Use: Yes  . Drug Use:   . Sexually Active: Yes   Other Topics Concern  . Not on file   Social History Narrative  . No narrative on file    Past Surgical History  Procedure Date  . Breast surgery     fibroid tumors  . Dilation and curettage of uterus     Family History  Problem Relation Age of Onset  . Coronary artery  disease      fhx  . Heart disease Mother   . Cancer Father     GI ( stomach)    No Known Allergies  Current Outpatient Prescriptions on File Prior to Visit  Medication Sig Dispense Refill  . benzonatate (TESSALON) 100 MG capsule TAKE 1 CAPSULE BY MOUTH 3TIMES A DAY AS NEEDED FORCOUGH  20 capsule  0  . budesonide-formoterol (SYMBICORT) 80-4.5 MCG/ACT inhaler Inhale 2 puffs into the lungs 2 (two) times daily as needed.       . fexofenadine (ALLEGRA) 180 MG tablet Take 180 mg by mouth daily.      Marland Kitchen losartan-hydrochlorothiazide (HYZAAR) 50-12.5 MG per tablet Take 1 tablet by mouth daily.  30 tablet  11  . PRISTIQ 100 MG 24 hr tablet TAKE 1 TABLET EVERY DAY  30 tablet  6  . Probiotic Product (ALIGN PO) Take by mouth daily.      . Psyllium (METAMUCIL PO) Take by mouth daily.      Marland Kitchen SYNTHROID 137 MCG tablet TAKE 1 TABLET EVERY DAY  90 tablet  3  . verapamil (CALAN-SR) 240 MG CR tablet Take 1 tablet (240 mg total) by mouth at bedtime.  30 tablet  6  . QUEtiapine (SEROQUEL) 25 MG tablet Take 0.5 tablets (12.5 mg total) by mouth at bedtime. 30 minutes before sleep/bed  30 tablet  3  . DISCONTD: Amlodipine-Valsartan-HCTZ 5-160-12.5 MG TABS Take  1 tablet by mouth daily.  90 tablet  3  . DISCONTD: Rivaroxaban 20 MG TABS Take 20 mg by mouth daily.  30 tablet  6    BP 130/76  Pulse 72  Temp 99 F (37.2 C) (Oral)  Wt 186 lb (84.369 kg)       Objective:   Physical Exam  Nursing note and vitals reviewed. Constitutional: She is oriented to person, place, and time. She appears well-developed and well-nourished. No distress.  HENT:  Head: Normocephalic and atraumatic.  Right Ear: External ear normal.  Left Ear: External ear normal.  Nose: Nose normal.  Mouth/Throat: Oropharynx is clear and moist.  Eyes: Conjunctivae and EOM are normal. Pupils are equal, round, and reactive to light.  Neck: Normal range of motion. Neck supple. No JVD present. No tracheal deviation present. No thyromegaly  present.  Cardiovascular: Normal rate, regular rhythm, normal heart sounds and intact distal pulses.   No murmur heard. Pulmonary/Chest: Effort normal and breath sounds normal. She has no wheezes. She exhibits no tenderness.  Abdominal: Soft. Bowel sounds are normal.  Musculoskeletal: Normal range of motion. She exhibits no edema and no tenderness.  Lymphadenopathy:    She has no cervical adenopathy.  Neurological: She is alert and oriented to person, place, and time. She has normal reflexes. No cranial nerve deficit.  Skin: Skin is warm and dry. She is not diaphoretic.  Psychiatric: She has a normal mood and affect. Her behavior is normal.          Assessment & Plan:   Patient has stable asthma.  Patient has increased insomnia we discussed the use of low-dose Seroquel 12 mg by mouth each bedtime since she has failed multiple attempts at hypnotics.  Patient has stable hila pressure.  Patient has persistent problems with weight gain we discussed a gluten-free diet and gave her copy of a diet.

## 2011-09-09 NOTE — Patient Instructions (Signed)
Take the Seroquel at nighttime 2 weeks arose without skipping then start skipping at least one out of 3 nights to see if you can wean yourself off the medication after having established a sleep pattern falling asleep. However if you need to stay on the Seroquel for sleep....it is not addictive

## 2011-10-05 ENCOUNTER — Encounter: Payer: Self-pay | Admitting: Internal Medicine

## 2011-10-05 ENCOUNTER — Ambulatory Visit (INDEPENDENT_AMBULATORY_CARE_PROVIDER_SITE_OTHER): Payer: BC Managed Care – PPO | Admitting: Internal Medicine

## 2011-10-05 ENCOUNTER — Telehealth: Payer: Self-pay | Admitting: Internal Medicine

## 2011-10-05 VITALS — BP 118/72 | Temp 98.1°F | Wt 184.0 lb

## 2011-10-05 DIAGNOSIS — G479 Sleep disorder, unspecified: Secondary | ICD-10-CM

## 2011-10-05 DIAGNOSIS — R51 Headache: Secondary | ICD-10-CM

## 2011-10-05 MED ORDER — MOMETASONE FUROATE 50 MCG/ACT NA SUSP: 2.0000 | Freq: Every day | NASAL | Status: DC

## 2011-10-05 MED ORDER — CEFUROXIME AXETIL 500 MG PO TABS: 500.0000 mg | ORAL_TABLET | Freq: Two times a day (BID) | ORAL | Status: AC

## 2011-10-05 MED ORDER — CLONAZEPAM 1 MG PO TABS: 1.0000 mg | ORAL_TABLET | Freq: Two times a day (BID) | ORAL | Status: DC | PRN

## 2011-10-05 NOTE — Assessment & Plan Note (Signed)
Patient has history of chronic insomnia. Refractory to Ambien and lorazepam. She was recently started Seroquel 25 mg one half tablet at bedtime. It is causing excessive somnolence. She is also unsure whether new onset headache is related to medication. Discontinue Seroquel. Use clonazepam 1 mg at bedtime as needed.

## 2011-10-05 NOTE — Telephone Encounter (Signed)
Appointment with dr Artist Pais this pm- pt informed

## 2011-10-05 NOTE — Progress Notes (Signed)
Subjective:    Patient ID: Erica Marquez, female    DOB: 1950-11-25, 61 y.o.   MRN: [id number]  HPI  61 year old white female with history of hypertension, palpitations and ADD complains of new onset headache that started approximately 2 weeks ago. She describes a constant aching pressure sensation right side of her head. She reports initially experiencing a burning twinge. Symptoms later progressed to pressure sensation and headache on top of her head that is worse with cough or clearing her throat. "I feel like my head is going to explode". She has had sinus infections in the past and the symptoms are similar. However she denies any recent sinus congestion or purulent nasal discharge. She did have a possible conjunctivitis one week ago and was seen by her ophthalmologist.  She also has history of chronic insomnia. Her insomnia has been refractory to multiple sleep medications including Ambien, lorazepam and other sedatives. She was started on Seroquel one half tablet at bedtime. She reports excessive somnolence since starting this medication. She is also not sure whether her headaches are related to starting Seroquel.  She has also noticed very vivid dreams.  Review of Systems Negative for fever or chills, negative for focal neurologic changes  Past Medical History  Diagnosis Date  . Arthritis   . Depression   . Allergy   . Asthma   . GERD (gastroesophageal reflux disease)   . Hypertension   . Hyperlipidemia   . Fibroids   . Diverticulosis of colon   . Blunt head trauma   . Premature ventricular contractions   . IBS (irritable bowel syndrome)     History   Social History  . Marital Status: Married    Spouse Name: N/A    Number of Children: N/A  . Years of Education: N/A   Occupational History  . Not on file.   Social History Main Topics  . Smoking status: Former Games developer  . Smokeless tobacco: Not on file  . Alcohol Use: Yes  . Drug Use:   . Sexually Active: Yes   Other  Topics Concern  . Not on file   Social History Narrative  . No narrative on file    Past Surgical History  Procedure Date  . Breast surgery     fibroid tumors  . Dilation and curettage of uterus     Family History  Problem Relation Age of Onset  . Coronary artery disease      fhx  . Heart disease Mother   . Cancer Father     GI ( stomach)    No Known Allergies  Current Outpatient Prescriptions on File Prior to Visit  Medication Sig Dispense Refill  . benzonatate (TESSALON) 100 MG capsule TAKE 1 CAPSULE BY MOUTH 3TIMES A DAY AS NEEDED FORCOUGH  20 capsule  0  . budesonide-formoterol (SYMBICORT) 80-4.5 MCG/ACT inhaler Inhale 2 puffs into the lungs 2 (two) times daily as needed.       . fexofenadine (ALLEGRA) 180 MG tablet Take 180 mg by mouth daily.      Marland Kitchen losartan-hydrochlorothiazide (HYZAAR) 50-12.5 MG per tablet Take 1 tablet by mouth daily.  30 tablet  11  . PRISTIQ 100 MG 24 hr tablet TAKE 1 TABLET EVERY DAY  30 tablet  6  . Probiotic Product (ALIGN PO) Take by mouth daily.      . Psyllium (METAMUCIL PO) Take by mouth daily.      Marland Kitchen SYNTHROID 137 MCG tablet TAKE 1 TABLET EVERY DAY  90 tablet  3  . verapamil (CALAN-SR) 240 MG CR tablet Take 1 tablet (240 mg total) by mouth at bedtime.  30 tablet  6  . mometasone (NASONEX) 50 MCG/ACT nasal spray Place 2 sprays into the nose daily.  17 g  3  . DISCONTD: Amlodipine-Valsartan-HCTZ 5-160-12.5 MG TABS Take 1 tablet by mouth daily.  90 tablet  3  . DISCONTD: Rivaroxaban 20 MG TABS Take 20 mg by mouth daily.  30 tablet  6    BP 118/72  Temp 98.1 F (36.7 C) (Oral)  Wt 184 lb (83.462 kg)       Objective:   Physical Exam  Constitutional: She is oriented to person, place, and time. She appears well-developed and well-nourished.  HENT:  Head: Normocephalic and atraumatic.  Right Ear: External ear normal.  Left Ear: External ear normal.  Mouth/Throat: Oropharynx is clear and moist.  Eyes: Conjunctivae normal and EOM are  normal. Pupils are equal, round, and reactive to light.       No defects in peripheral vision  Neck: Neck supple.  Cardiovascular: Normal rate, regular rhythm and normal heart sounds.   No murmur heard. Pulmonary/Chest: Effort normal and breath sounds normal. She has no wheezes. She has no rales.  Lymphadenopathy:    She has no cervical adenopathy.  Neurological: She is alert and oriented to person, place, and time. She displays normal reflexes. No cranial nerve deficit. She exhibits normal muscle tone. Coordination normal.       Negative Romberg Negative cerebellar signs No pronator drift  Skin: Skin is warm and dry.  Psychiatric: She has a normal mood and affect. Her behavior is normal.      Assessment & Plan:

## 2011-10-05 NOTE — Telephone Encounter (Signed)
Caller: Erica Marquez/Patient; PCP: Darryll Capers: Call regarding Headache on top of her  head that increases when she bends over or laughs.  Onset approx 1  1/2 wk ago.   She describes it as a pressure on the top right side of her head and at times will radiate over the top of her right eye.  Sweta says it feels similar to  a sinus headache without sinus symptoms. On occasion she gets a sick feeling in her throat.  Afebrile.  All emergent symptoms ruled out per Headache protocol with exception to 'Headache associated with or made worse by coughing, straining at stool, excertion''.  See Provider within 24 hrs.   PLEASE FOLLOW UP WITH PT IN REGARD TO AN APPT.  Thank you.

## 2011-10-05 NOTE — Assessment & Plan Note (Signed)
61 year old white female with new onset headache that started 2 weeks ago. She describes pressure sensation that is worse with head movements and coughing. She feels symptoms are similar to previous sinus infection. Her neurologic exam is completely normal. Treat for possible sinusitis with cefuroxime 500 mg twice daily for 10 days.  If no movement in her symptoms, we discussed obtaining MRI of brain without contrast. Reassess in one week.

## 2011-10-12 ENCOUNTER — Ambulatory Visit (INDEPENDENT_AMBULATORY_CARE_PROVIDER_SITE_OTHER): Payer: BC Managed Care – PPO | Admitting: Internal Medicine

## 2011-10-12 ENCOUNTER — Encounter: Payer: Self-pay | Admitting: Internal Medicine

## 2011-10-12 VITALS — BP 118/80 | Temp 98.0°F | Wt 186.0 lb

## 2011-10-12 DIAGNOSIS — R51 Headache: Secondary | ICD-10-CM

## 2011-10-12 DIAGNOSIS — G479 Sleep disorder, unspecified: Secondary | ICD-10-CM

## 2011-10-12 MED ORDER — ALPRAZOLAM 0.25 MG PO TABS: ORAL_TABLET | ORAL | Status: DC

## 2011-10-12 NOTE — Patient Instructions (Addendum)
Our office will contact you re: MRI of brain results

## 2011-10-12 NOTE — Assessment & Plan Note (Signed)
61 year old white female has persistent right-sided headache despite empiric treatment with antibiotics for possible sinus infection. The fact the patient has worsening symptoms with cough or bending forward is worrisome for intracranial lesion. Obtain MRI of brain without contrast. She has history of claustrophobia. Patient advised to take alprazolam 0.25 mg 1-2 tablets 30 minutes before MRI.

## 2011-10-12 NOTE — Assessment & Plan Note (Signed)
Good response to clonazepam.  Continue same dose. 

## 2011-10-12 NOTE — Progress Notes (Signed)
Subjective:    Patient ID: Erica Marquez, female    DOB: August 25, 1950, 61 y.o.   MRN: [id number]  HPI  61 year old white female previously seen for right-sided headache for followup. Patient empirically tried on antibiotics and antihistamines for possible sinus infection. Patient reports symptoms mildly improved but patient has persistent headache. She feels pressure-like sensation.  As previously mentioned, she does not have any prior history of chronic headaches or migraines. Her headaches have been worse with cough or clearing her throat.  Insomnia-good response to discontinuing Seroquel and trying clonazepam at bedtime.   Review of Systems Negative for fever or chills.  Patient reports she was recently seen by dentist and ophthalmologist. No obvious trigger for headaches.  Past Medical History  Diagnosis Date  . Arthritis   . Depression   . Allergy   . Asthma   . GERD (gastroesophageal reflux disease)   . Hypertension   . Hyperlipidemia   . Fibroids   . Diverticulosis of colon   . Blunt head trauma   . Premature ventricular contractions   . IBS (irritable bowel syndrome)     History   Social History  . Marital Status: Married    Spouse Name: N/A    Number of Children: N/A  . Years of Education: N/A   Occupational History  . Not on file.   Social History Main Topics  . Smoking status: Former Games developer  . Smokeless tobacco: Not on file  . Alcohol Use: Yes  . Drug Use:   . Sexually Active: Yes   Other Topics Concern  . Not on file   Social History Narrative  . No narrative on file    Past Surgical History  Procedure Date  . Breast surgery     fibroid tumors  . Dilation and curettage of uterus     Family History  Problem Relation Age of Onset  . Coronary artery disease      fhx  . Heart disease Mother   . Cancer Father     GI ( stomach)    No Known Allergies  Current Outpatient Prescriptions on File Prior to Visit  Medication Sig Dispense Refill   . benzonatate (TESSALON) 100 MG capsule TAKE 1 CAPSULE BY MOUTH 3TIMES A DAY AS NEEDED FORCOUGH  20 capsule  0  . budesonide-formoterol (SYMBICORT) 80-4.5 MCG/ACT inhaler Inhale 2 puffs into the lungs 2 (two) times daily as needed.       . cefUROXime (CEFTIN) 500 MG tablet Take 1 tablet (500 mg total) by mouth 2 (two) times daily.  20 tablet  0  . clonazePAM (KLONOPIN) 1 MG tablet Take 1 tablet (1 mg total) by mouth 2 (two) times daily as needed for anxiety.  30 tablet  0  . fexofenadine (ALLEGRA) 180 MG tablet Take 180 mg by mouth daily.      Marland Kitchen losartan-hydrochlorothiazide (HYZAAR) 50-12.5 MG per tablet Take 1 tablet by mouth daily.  30 tablet  11  . mometasone (NASONEX) 50 MCG/ACT nasal spray Place 2 sprays into the nose daily.  17 g  3  . PRISTIQ 100 MG 24 hr tablet TAKE 1 TABLET EVERY DAY  30 tablet  6  . Probiotic Product (ALIGN PO) Take by mouth daily.      . Psyllium (METAMUCIL PO) Take by mouth daily.      Marland Kitchen SYNTHROID 137 MCG tablet TAKE 1 TABLET EVERY DAY  90 tablet  3  . verapamil (CALAN-SR) 240 MG CR tablet Take 1 tablet (  240 mg total) by mouth at bedtime.  30 tablet  6  . DISCONTD: Amlodipine-Valsartan-HCTZ 5-160-12.5 MG TABS Take 1 tablet by mouth daily.  90 tablet  3  . DISCONTD: Rivaroxaban 20 MG TABS Take 20 mg by mouth daily.  30 tablet  6    BP 118/80  Temp 98 F (36.7 C) (Oral)  Wt 186 lb (84.369 kg)       Objective:   Physical Exam  Constitutional: She is oriented to person, place, and time. She appears well-developed and well-nourished.  HENT:  Head: Normocephalic and atraumatic.  Right Ear: External ear normal.  Left Ear: External ear normal.  Cardiovascular: Normal rate, regular rhythm and normal heart sounds.   Pulmonary/Chest: Effort normal. She has no wheezes.  Neurological: She is alert and oriented to person, place, and time. No cranial nerve deficit.          Assessment & Plan:

## 2011-10-14 ENCOUNTER — Encounter: Payer: Self-pay | Admitting: Internal Medicine

## 2011-10-14 NOTE — Progress Notes (Signed)
Subjective:    Patient ID: Erica Marquez, female    DOB: 1950-09-15, 61 y.o.   MRN: [id number]  HPI patient is a 61 year old female was presents for followup of hypertension, hypothyroidism and perimenopausal symptomatology with vasomotor symptoms    Review of Systems  Constitutional: Negative for activity change, appetite change and fatigue.  HENT: Negative for ear pain, congestion, neck pain, postnasal drip and sinus pressure.   Eyes: Negative for redness and visual disturbance.  Respiratory: Negative for cough, shortness of breath and wheezing.   Gastrointestinal: Negative for abdominal pain and abdominal distention.  Genitourinary: Negative for dysuria, frequency and menstrual problem.  Musculoskeletal: Negative for myalgias, joint swelling and arthralgias.  Skin: Negative for rash and wound.  Neurological: Negative for dizziness, weakness and headaches.  Hematological: Negative for adenopathy. Does not bruise/bleed easily.  Psychiatric/Behavioral: Negative for disturbed wake/sleep cycle and decreased concentration.       Past Medical History  Diagnosis Date  . Arthritis   . Depression   . Allergy   . Asthma   . GERD (gastroesophageal reflux disease)   . Hypertension   . Hyperlipidemia   . Fibroids   . Diverticulosis of colon   . Blunt head trauma   . Premature ventricular contractions   . IBS (irritable bowel syndrome)     History   Social History  . Marital Status: Married    Spouse Name: N/A    Number of Children: N/A  . Years of Education: N/A   Occupational History  . Not on file.   Social History Main Topics  . Smoking status: Former Games developer  . Smokeless tobacco: Not on file  . Alcohol Use: Yes  . Drug Use:   . Sexually Active: Yes   Other Topics Concern  . Not on file   Social History Narrative  . No narrative on file    Past Surgical History  Procedure Date  . Breast surgery     fibroid tumors  . Dilation and curettage of uterus      Family History  Problem Relation Age of Onset  . Coronary artery disease      fhx  . Heart disease Mother   . Cancer Father     GI ( stomach)    No Known Allergies  Current Outpatient Prescriptions on File Prior to Visit  Medication Sig Dispense Refill  . budesonide-formoterol (SYMBICORT) 80-4.5 MCG/ACT inhaler Inhale 2 puffs into the lungs 2 (two) times daily as needed.       . fexofenadine (ALLEGRA) 180 MG tablet Take 180 mg by mouth daily.      Marland Kitchen losartan-hydrochlorothiazide (HYZAAR) 50-12.5 MG per tablet Take 1 tablet by mouth daily.  30 tablet  11  . Probiotic Product (ALIGN PO) Take by mouth daily.      . Psyllium (METAMUCIL PO) Take by mouth daily.      . verapamil (CALAN-SR) 240 MG CR tablet Take 1 tablet (240 mg total) by mouth at bedtime.  30 tablet  6  . mometasone (NASONEX) 50 MCG/ACT nasal spray Place 2 sprays into the nose daily.  17 g  3  . PRISTIQ 100 MG 24 hr tablet TAKE 1 TABLET EVERY DAY  30 tablet  6  . SYNTHROID 137 MCG tablet TAKE 1 TABLET EVERY DAY  90 tablet  3  . DISCONTD: Amlodipine-Valsartan-HCTZ 5-160-12.5 MG TABS Take 1 tablet by mouth daily.  90 tablet  3  . DISCONTD: Rivaroxaban 20 MG TABS Take 20 mg by  mouth daily.  30 tablet  6    BP 136/78  Pulse 72  Temp 98.3 F (36.8 C)  Resp 16  Ht 5\' 9"  (1.753 m)  Wt 184 lb (83.462 kg)  BMI 27.17 kg/m2    Objective:   Physical Exam  Constitutional: She appears well-developed and well-nourished.  HENT:  Head: Normocephalic and atraumatic.  Eyes: Conjunctivae normal are normal. Pupils are equal, round, and reactive to light.  Cardiovascular: Normal rate.   Murmur heard. Pulmonary/Chest: Effort normal and breath sounds normal.  Abdominal: Soft. Bowel sounds are normal.          Assessment & Plan:  Stable blood pressure Recurrent hot flashes with recurrent menopausal symptomology off hormonal replacement On SSRI for treatment of menopausal and anxiety syndromes Stable  hypothyroidism Cervical strain syndrome with the trapezius spasm and musculoskeletal pain

## 2011-10-18 NOTE — Progress Notes (Signed)
  Subjective:    Patient ID: Erica Marquez, female    DOB: Nov 08, 1950, 61 y.o.   MRN: [id number]  HPI Patient presents today for monitoring of hypothyroidism a TSH will be ordered today to adjust the dose Of her Synthroid as indicated.  She is also followed for hypertension menopausal symptoms and mild to moderate depression on pristiq she is stable on these medications  Review of Systems  Constitutional: Negative.   HENT: Negative.   Eyes: Negative.   Respiratory: Negative.   Cardiovascular: Negative.        Objective:   Physical Exam  Nursing note and vitals reviewed. Constitutional: She appears well-developed and well-nourished.  HENT:  Head: Normocephalic and atraumatic.  Eyes: Conjunctivae normal and EOM are normal.  Neck: No tracheal deviation present. No thyromegaly present.  Cardiovascular: Normal rate and regular rhythm.   Murmur heard. Lymphadenopathy:    She has no cervical adenopathy.          Assessment & Plan:  Monitor TSH and adjust Synthroid as appropriate.  Stable hypertension  She is mild tension occipital headache and cervical tension syndrome we have given her Flexeril as a muscle relaxant and taught her neck exercises

## 2011-10-20 ENCOUNTER — Ambulatory Visit
Admission: RE | Admit: 2011-10-20 | Discharge: 2011-10-20 | Disposition: A | Discharge status: Home / Self Care | Payer: BC Managed Care – PPO | Source: Ambulatory Visit | Attending: Internal Medicine | Admitting: Internal Medicine

## 2011-10-20 DIAGNOSIS — R51 Headache: Secondary | ICD-10-CM

## 2011-10-21 ENCOUNTER — Other Ambulatory Visit: Payer: Self-pay | Admitting: *Deleted

## 2011-10-21 MED ORDER — LORATADINE 10 MG PO TABS: 10.0000 mg | ORAL_TABLET | Freq: Every day | ORAL | Status: DC

## 2011-10-21 NOTE — Progress Notes (Signed)
Discussed with pt and she thinks maybe allergies because it is a dull headache- wants to try claritin- wants sent to pharmacy

## 2011-10-22 ENCOUNTER — Telehealth: Payer: Self-pay | Admitting: Internal Medicine

## 2011-10-22 NOTE — Telephone Encounter (Signed)
Patient already notified of MRI of brain by nursing staff.  No additional issues to discuss.

## 2011-10-22 NOTE — Telephone Encounter (Signed)
Pt called and said that she was returning call from Dr Artist Pais from yesterday, re: MRI. Pls call.

## 2011-12-03 ENCOUNTER — Other Ambulatory Visit (INDEPENDENT_AMBULATORY_CARE_PROVIDER_SITE_OTHER): Payer: BC Managed Care – PPO

## 2011-12-03 DIAGNOSIS — Z0000 Encounter for general adult medical examination without abnormal findings: Secondary | ICD-10-CM

## 2011-12-03 LAB — BASIC METABOLIC PANEL
BUN: 14 mg/dL (ref 6–23)
CO2: 27 mEq/L (ref 19–32)
Chloride: 105 mEq/L (ref 96–112)
Creatinine, Ser: 0.8 mg/dL (ref 0.4–1.2)
Glucose, Bld: 104 mg/dL — ABNORMAL HIGH (ref 70–99)

## 2011-12-03 LAB — CBC WITH DIFFERENTIAL/PLATELET
Basophils Absolute: 0 10*3/uL (ref 0.0–0.1)
Basophils Relative: 1.1 % (ref 0.0–3.0)
Eosinophils Absolute: 0.1 10*3/uL (ref 0.0–0.7)
MCHC: 33.1 g/dL (ref 30.0–36.0)
MCV: 87.8 fl (ref 78.0–100.0)
Monocytes Absolute: 0.4 10*3/uL (ref 0.1–1.0)
Neutrophils Relative %: 53 % (ref 43.0–77.0)
Platelets: 218 10*3/uL (ref 150.0–400.0)
RBC: 4.63 Mil/uL (ref 3.87–5.11)
RDW: 14.3 % (ref 11.5–14.6)

## 2011-12-03 LAB — POCT URINALYSIS DIPSTICK
Bilirubin, UA: NEGATIVE
Ketones, UA: NEGATIVE
pH, UA: 6

## 2011-12-03 LAB — LIPID PANEL: Triglycerides: 206 mg/dL — ABNORMAL HIGH (ref 0.0–149.0)

## 2011-12-03 LAB — HEPATIC FUNCTION PANEL
AST: 34 U/L (ref 0–37)
Albumin: 3.9 g/dL (ref 3.5–5.2)
Alkaline Phosphatase: 72 U/L (ref 39–117)
Total Bilirubin: 0.4 mg/dL (ref 0.3–1.2)

## 2011-12-11 ENCOUNTER — Ambulatory Visit (INDEPENDENT_AMBULATORY_CARE_PROVIDER_SITE_OTHER): Payer: BC Managed Care – PPO | Admitting: Internal Medicine

## 2011-12-11 ENCOUNTER — Encounter: Payer: Self-pay | Admitting: Internal Medicine

## 2011-12-11 VITALS — BP 120/80 | HR 65 | Ht 69.0 in | Wt 186.0 lb

## 2011-12-11 VITALS — BP 136/80 | HR 76 | Temp 98.2°F | Resp 16 | Ht 69.0 in | Wt 186.0 lb

## 2011-12-11 DIAGNOSIS — Z13820 Encounter for screening for osteoporosis: Secondary | ICD-10-CM

## 2011-12-11 DIAGNOSIS — I10 Essential (primary) hypertension: Secondary | ICD-10-CM

## 2011-12-11 DIAGNOSIS — I4949 Other premature depolarization: Secondary | ICD-10-CM

## 2011-12-11 DIAGNOSIS — Z0000 Encounter for general adult medical examination without abnormal findings: Secondary | ICD-10-CM

## 2011-12-11 DIAGNOSIS — I493 Ventricular premature depolarization: Secondary | ICD-10-CM

## 2011-12-11 MED ORDER — LOSARTAN POTASSIUM-HCTZ 50-12.5 MG PO TABS: 1.0000 | ORAL_TABLET | Freq: Every day | ORAL | Status: DC

## 2011-12-11 MED ORDER — LEVOTHYROXINE SODIUM 137 MCG PO TABS: 137.0000 ug | ORAL_TABLET | Freq: Every day | ORAL | Status: DC

## 2011-12-11 MED ORDER — DOXYCYCLINE HYCLATE 100 MG PO TABS: 100.0000 mg | ORAL_TABLET | Freq: Two times a day (BID) | ORAL | Status: DC

## 2011-12-11 MED ORDER — BUDESONIDE-FORMOTEROL FUMARATE 80-4.5 MCG/ACT IN AERO: 2.0000 | INHALATION_SPRAY | Freq: Two times a day (BID) | RESPIRATORY_TRACT | Status: DC | PRN

## 2011-12-11 MED ORDER — DILTIAZEM HCL ER 240 MG PO CP24: 240.0000 mg | ORAL_CAPSULE | Freq: Every day | ORAL | Status: DC

## 2011-12-11 MED ORDER — MOMETASONE FUROATE 50 MCG/ACT NA SUSP: 2.0000 | Freq: Every day | NASAL | Status: DC

## 2011-12-11 MED ORDER — DESVENLAFAXINE SUCCINATE ER 100 MG PO TB24: 100.0000 mg | ORAL_TABLET | Freq: Every day | ORAL | Status: DC

## 2011-12-11 NOTE — Patient Instructions (Signed)
Your physician wants you to follow-up in: 6 months with Dr. Graciela Husbands. You will receive a reminder letter in the mail two months in advance. If you don't receive a letter, please call our office to schedule the follow-up appointment.  Stop verapamil.  Start Diltiazem 120mg  daily.

## 2011-12-11 NOTE — Progress Notes (Signed)
Subjective:    Patient ID: Erica Marquez, female    DOB: [birth year], 61 y.o.   MRN: [id number]  HPI Present for CPX Hx of ADD, mild depression, stable asthma, HTN and hyperlipidemia    Review of Systems  Constitutional: Negative for activity change, appetite change and fatigue.  HENT: Negative for ear pain, congestion, neck pain, postnasal drip and sinus pressure.   Eyes: Negative for redness and visual disturbance.  Respiratory: Negative for cough, shortness of breath and wheezing.   Gastrointestinal: Negative for abdominal pain and abdominal distention.  Genitourinary: Negative for dysuria, frequency and menstrual problem.  Musculoskeletal: Negative for myalgias, joint swelling and arthralgias.  Skin: Negative for rash and wound.  Neurological: Negative for dizziness, weakness and headaches.  Hematological: Negative for adenopathy. Does not bruise/bleed easily.  Psychiatric/Behavioral: Negative for sleep disturbance and decreased concentration.   Past Medical History  Diagnosis Date  . Arthritis   . Depression   . Allergy   . Asthma   . GERD (gastroesophageal reflux disease)   . Hypertension   . Hyperlipidemia   . Fibroids   . Diverticulosis of colon   . Blunt head trauma   . Premature ventricular contractions   . IBS (irritable bowel syndrome)     History   Social History  . Marital Status: Married    Spouse Name: N/A    Number of Children: N/A  . Years of Education: N/A   Occupational History  . Not on file.   Social History Main Topics  . Smoking status: Former Games developer  . Smokeless tobacco: Not on file  . Alcohol Use: Yes  . Drug Use:   . Sexually Active: Yes   Other Topics Concern  . Not on file   Social History Narrative  . No narrative on file    Past Surgical History  Procedure Date  . Breast surgery     fibroid tumors  . Dilation and curettage of uterus     Family History  Problem Relation Age of Onset  . Coronary artery disease     fhx  . Heart disease Mother   . Cancer Father     GI ( stomach)    No Known Allergies  Current Outpatient Prescriptions on File Prior to Visit  Medication Sig Dispense Refill  . budesonide-formoterol (SYMBICORT) 80-4.5 MCG/ACT inhaler Inhale 2 puffs into the lungs 2 (two) times daily as needed.  3 Inhaler  3  . desvenlafaxine (PRISTIQ) 100 MG 24 hr tablet Take 1 tablet (100 mg total) by mouth daily.  90 tablet  3  . levothyroxine (SYNTHROID) 137 MCG tablet Take 1 tablet (137 mcg total) by mouth daily.  90 tablet  3  . loratadine (CLARITIN) 10 MG tablet Take 1 tablet (10 mg total) by mouth daily.  30 tablet  11  . losartan-hydrochlorothiazide (HYZAAR) 50-12.5 MG per tablet Take 1 tablet by mouth daily.  90 tablet  3  . mometasone (NASONEX) 50 MCG/ACT nasal spray Place 2 sprays into the nose daily.  51 g  3  . Probiotic Product (ALIGN PO) Take by mouth daily.      . Psyllium (METAMUCIL PO) Take by mouth daily.      . metoprolol succinate (TOPROL-XL) 50 MG 24 hr tablet Take 1 tablet (50 mg total) by mouth daily. Take with or immediately following a meal.  90 tablet  3  . traZODone (DESYREL) 50 MG tablet       . [DISCONTINUED] Amlodipine-Valsartan-HCTZ 5-160-12.5 MG TABS  Take 1 tablet by mouth daily.  90 tablet  3  . [DISCONTINUED] Rivaroxaban 20 MG TABS Take 20 mg by mouth daily.  30 tablet  6    BP 136/80  Pulse 76  Temp 98.2 F (36.8 C)  Resp 16  Ht 5\' 9"  (1.753 m)  Wt 186 lb (84.369 kg)  BMI 27.47 kg/m2       Objective:   Physical Exam  Nursing note and vitals reviewed. Constitutional: She is oriented to person, place, and time. She appears well-developed and well-nourished. No distress.  HENT:  Head: Normocephalic and atraumatic.  Right Ear: External ear normal.  Left Ear: External ear normal.  Nose: Nose normal.  Mouth/Throat: Oropharynx is clear and moist.  Eyes: Conjunctivae normal and EOM are normal. Pupils are equal, round, and reactive to light.  Neck: Normal  range of motion. Neck supple. No JVD present. No tracheal deviation present. No thyromegaly present.  Cardiovascular: Normal rate, regular rhythm and intact distal pulses.   Murmur heard. Pulmonary/Chest: Effort normal and breath sounds normal. She has no wheezes. She exhibits no tenderness.  Abdominal: Soft. Bowel sounds are normal.  Musculoskeletal: Normal range of motion. She exhibits no edema and no tenderness.  Lymphadenopathy:    She has no cervical adenopathy.  Neurological: She is alert and oriented to person, place, and time. She has normal reflexes. No cranial nerve deficit.  Skin: Skin is warm and dry. She is not diaphoretic.  Psychiatric: She has a normal mood and affect. Her behavior is normal.          Assessment & Plan:   Patient presents for yearly preventative medicine examination.   all immunizations and health maintenance protocols were reviewed with the patient and they are up to date with these protocols.   screening laboratory values were reviewed with the patient including screening of hyperlipidemia PSA renal function and hepatic function.   There medications past medical history social history problem list and allergies were reviewed in detail.   Goals were established with regard to weight loss exercise diet in compliance with medications Stable thyroid and asthma Lipid control worsened by weight gain Blood pressure control not optimal  Weight loss diet reveiwed

## 2011-12-11 NOTE — Assessment & Plan Note (Signed)
We'll continue calcium blocker therapy but we'll try diltiazem to see if there is any improvement in the weight issue

## 2011-12-11 NOTE — Patient Instructions (Addendum)
The patient is instructed to continue all medications as prescribed. Schedule followup with check out clerk upon leaving the clinic  

## 2011-12-11 NOTE — Assessment & Plan Note (Signed)
As above.

## 2011-12-11 NOTE — Progress Notes (Signed)
  HPI  Erica Marquez is a 61 y.o. female seen in followup for RVOT PVCs and  Hypertension   She also carries a history of atrial fibrillation that I had given her associated with popcorn sensations.  After reviewing all that i could find with her name on it i could find nothing with atrial fibrillation-- i dont know how I made the error  For PVCs are much improved. She has gained about 20 to last year and she wonders whether his verapamil. I did a quick GOOGLE search and found some complaints of patients having weight loss with verapamil but could not find it in the drugs.com website.           Past Medical History  Diagnosis Date  . Arthritis   . Depression   . Allergy   . Asthma   . GERD (gastroesophageal reflux disease)   . Hypertension   . Hyperlipidemia   . Fibroids   . Diverticulosis of colon   . Blunt head trauma   . Premature ventricular contractions   . IBS (irritable bowel syndrome)     Past Surgical History  Procedure Date  . Breast surgery     fibroid tumors  . Dilation and curettage of uterus     Current Outpatient Prescriptions  Medication Sig Dispense Refill  . benzonatate (TESSALON) 100 MG capsule TAKE 1 CAPSULE BY MOUTH 3TIMES A DAY AS NEEDED FORCOUGH  20 capsule  0  . budesonide-formoterol (SYMBICORT) 80-4.5 MCG/ACT inhaler Inhale 2 puffs into the lungs 2 (two) times daily as needed.       . loratadine (CLARITIN) 10 MG tablet Take 1 tablet (10 mg total) by mouth daily.  30 tablet  11  . losartan-hydrochlorothiazide (HYZAAR) 50-12.5 MG per tablet Take 1 tablet by mouth daily.  30 tablet  11  . mometasone (NASONEX) 50 MCG/ACT nasal spray Place 2 sprays into the nose daily.  17 g  3  . PRISTIQ 100 MG 24 hr tablet TAKE 1 TABLET EVERY DAY  30 tablet  6  . Probiotic Product (ALIGN PO) Take by mouth daily.      . Psyllium (METAMUCIL PO) Take by mouth daily.      Marland Kitchen SYNTHROID 137 MCG tablet TAKE 1 TABLET EVERY DAY  90 tablet  3  . verapamil (CALAN-SR) 240  MG CR tablet Take 1 tablet (240 mg total) by mouth at bedtime.  30 tablet  6  . [DISCONTINUED] Amlodipine-Valsartan-HCTZ 5-160-12.5 MG TABS Take 1 tablet by mouth daily.  90 tablet  3  . [DISCONTINUED] Rivaroxaban 20 MG TABS Take 20 mg by mouth daily.  30 tablet  6    No Known Allergies  Review of Systems negative except from HPI and PMH  Physical Exam Ht 5\' 9"  (1.753 m)  Wt 186 lb (84.369 kg)  BMI 27.47 kg/m2 Well developed and well nourished in no acute distress HENT normal E scleral and icterus clear Neck Supple JVP flat; carotids brisk and full Clear to ausculation Regular rate and rhythm, no murmurs gallops or rub Soft with active bowel sounds No clubbing cyanosis none Edema Alert and oriented, grossly normal motor and sensory function Skin Warm and Dry   Assessment and  Plan Ecg  NSR @ 61 .14/.08./.422 nonspecific t wave

## 2011-12-15 ENCOUNTER — Ambulatory Visit: Payer: BC Managed Care – PPO | Admitting: Internal Medicine

## 2011-12-15 ENCOUNTER — Telehealth: Payer: Self-pay | Admitting: Internal Medicine

## 2011-12-15 MED ORDER — CEFUROXIME AXETIL 500 MG PO TABS: 500.0000 mg | ORAL_TABLET | Freq: Two times a day (BID) | ORAL | Status: DC

## 2011-12-15 NOTE — Telephone Encounter (Signed)
Per Dr Lovell Sheehan ok to call in Ceftin 500 mg bid x7 days, pt aware, rx sent in electronically to [facility]

## 2011-12-15 NOTE — Telephone Encounter (Signed)
[facility] Triage Call Report Triage Record Num: [id number] Operator: Levora Angel Patient Name: Erica Marquez Call Date & Time: 12/14/2011 5:21:12PM Patient Phone: [phone] PCP: Darryll Capers Patient Gender: Female PCP Fax : [phone] Patient DOB: 1950/12/27 [facility]  Reason for Call: Caller: [name]/Patient; PCP: Darryll Capers (Adults only); CB#: [phone]; Call regarding Sinus infection follow up; Onset: Afebrile 12/14/11 Patient has been having headaches since September. States when coughing feels like head is going to explode and has pressure to eyes, forehead area. Pain 6/10. Took Tylenol 2 325mg  at 1600 with no improvement. Patient was seen in the office for the same symptoms and was prescribed Doxycycline no improvement since starting medication on Friday 12/11/11 however symptoms have not worsened. Urgent symptom of "Constant headache and unrelieved with nonprescription medications" per Headache protocol. Patient to try homecare advice and if headache present in morning will call triage back for appointment or further instruction from provider.

## 2011-12-15 NOTE — Addendum Note (Signed)
Addended by: Alfred Levins D on: 12/15/2011 11:44 AM   Modules accepted: Orders

## 2011-12-21 ENCOUNTER — Telehealth: Payer: Self-pay | Admitting: Internal Medicine

## 2011-12-21 NOTE — Telephone Encounter (Signed)
Patient Information:  Caller Name: Darcus  Phone: [phone]  Patient: Erica Marquez, Erica Marquez  Gender: Female  DOB: Aug 17, 1950  Age: 61 Years  PCP: Darryll Capers (Adults only)   Symptoms  Reason For Call & Symptoms: Headaches on going since 09/13  Reviewed Health History In EMR: Yes  Reviewed Medications In EMR: Yes  Reviewed Allergies In EMR: Yes  Reviewed Surgeries / Procedures: Yes  Date of Onset of Symptoms: 12/11/2011  Treatments Tried: Antibiotic have helped, but headaches continue  Treatments Tried Worked: Yes  Guideline(s) Used:  High Blood Pressure  Disposition Per Guideline:   See Today in Office  Reason For Disposition Reached:   Patient wants to be seen  Advice Given:  Call Back If:  Headache, blurred vision, difficulty talking, or difficulty walking occurs  Chest pain or difficulty breathing occurs  You want to go in to the office for a blood pressure check  You become worse.  Office Follow Up:  Does the office need to follow up with this patient?: Yes  Instructions For The Office: Has chronic headache  (since 9/13) that has improved with second antibiotic treatment; but continues in AM and when coughs.  Is concerned about BP since home device has been registering higher than normal.  No available appointments today.  Please follow up with patient for a work in or scheduling tomorrow.  Has complained of random tingling/numbness in left finger and right toes.  RN Note:  Wakes up with  headaches behind eyes and temples and stronger when she cough (9/10 pain scale) when it happens; Little drainage--but clear; Continue pressure in head; BP runs on home device 155-165/85-90 for past week. Also has had random tingling in fingers and feet

## 2011-12-21 NOTE — Telephone Encounter (Signed)
Pt scheduled with Dr Kirtland Bouchard on 12/3 at 10:15

## 2011-12-22 ENCOUNTER — Ambulatory Visit (INDEPENDENT_AMBULATORY_CARE_PROVIDER_SITE_OTHER): Payer: BC Managed Care – PPO | Admitting: Internal Medicine

## 2011-12-22 ENCOUNTER — Encounter: Payer: Self-pay | Admitting: Internal Medicine

## 2011-12-22 VITALS — BP 130/80 | HR 72 | Temp 98.2°F | Resp 18 | Wt 183.0 lb

## 2011-12-22 DIAGNOSIS — R002 Palpitations: Secondary | ICD-10-CM

## 2011-12-22 DIAGNOSIS — I10 Essential (primary) hypertension: Secondary | ICD-10-CM

## 2011-12-22 MED ORDER — TRAMADOL HCL 50 MG PO TABS: 50.0000 mg | ORAL_TABLET | Freq: Three times a day (TID) | ORAL | Status: DC | PRN

## 2011-12-22 MED ORDER — METOPROLOL SUCCINATE ER 50 MG PO TB24: 50.0000 mg | ORAL_TABLET | Freq: Every day | ORAL | Status: DC

## 2011-12-22 NOTE — Progress Notes (Signed)
Subjective:    Patient ID: Erica Marquez, female    DOB: October 05, 1950, 61 y.o.   MRN: [id number]  HPI  61 year old patient who presents with a chief complaint of headaches that have been present since September. These occur daily and are most marked in the periorbital and bitemporal regions. She has had a negative MRI. She has been treated aggressively for sinus disease both allergic and infectious without benefit. She has been on CCB for palpitations. One day she accidentally took diltiazem rather than verapamil and had much worsening of her headaches. Her palpitations have been stable. She does have a history of asthma that also has been quite stable.  Past Medical History  Diagnosis Date  . Arthritis   . Depression   . Allergy   . Asthma   . GERD (gastroesophageal reflux disease)   . Hypertension   . Hyperlipidemia   . Fibroids   . Diverticulosis of colon   . Blunt head trauma   . Premature ventricular contractions   . IBS (irritable bowel syndrome)     History   Social History  . Marital Status: Married    Spouse Name: N/A    Number of Children: N/A  . Years of Education: N/A   Occupational History  . Not on file.   Social History Main Topics  . Smoking status: Former Games developer  . Smokeless tobacco: Not on file  . Alcohol Use: Yes  . Drug Use:   . Sexually Active: Yes   Other Topics Concern  . Not on file   Social History Narrative  . No narrative on file    Past Surgical History  Procedure Date  . Breast surgery     fibroid tumors  . Dilation and curettage of uterus     Family History  Problem Relation Age of Onset  . Coronary artery disease      fhx  . Heart disease Mother   . Cancer Father     GI ( stomach)    No Known Allergies  Current Outpatient Prescriptions on File Prior to Visit  Medication Sig Dispense Refill  . benzonatate (TESSALON) 100 MG capsule TAKE 1 CAPSULE BY MOUTH 3TIMES A DAY AS NEEDED FORCOUGH  20 capsule  0  .  budesonide-formoterol (SYMBICORT) 80-4.5 MCG/ACT inhaler Inhale 2 puffs into the lungs 2 (two) times daily as needed.  3 Inhaler  3  . desvenlafaxine (PRISTIQ) 100 MG 24 hr tablet Take 1 tablet (100 mg total) by mouth daily.  90 tablet  3  . levothyroxine (SYNTHROID) 137 MCG tablet Take 1 tablet (137 mcg total) by mouth daily.  90 tablet  3  . loratadine (CLARITIN) 10 MG tablet Take 1 tablet (10 mg total) by mouth daily.  30 tablet  11  . losartan-hydrochlorothiazide (HYZAAR) 50-12.5 MG per tablet Take 1 tablet by mouth daily.  90 tablet  3  . mometasone (NASONEX) 50 MCG/ACT nasal spray Place 2 sprays into the nose daily.  51 g  3  . Probiotic Product (ALIGN PO) Take by mouth daily.      . Psyllium (METAMUCIL PO) Take by mouth daily.      . traZODone (DESYREL) 50 MG tablet       . cefUROXime (CEFTIN) 500 MG tablet Take 1 tablet (500 mg total) by mouth 2 (two) times daily.  14 tablet  0  . diltiazem (DILACOR XR) 240 MG 24 hr capsule Take 1 capsule (240 mg total) by mouth daily.  90 capsule  3  . doxycycline (VIBRA-TABS) 100 MG tablet Take 1 tablet (100 mg total) by mouth 2 (two) times daily.  20 tablet  0  . [DISCONTINUED] Amlodipine-Valsartan-HCTZ 5-160-12.5 MG TABS Take 1 tablet by mouth daily.  90 tablet  3  . [DISCONTINUED] Rivaroxaban 20 MG TABS Take 20 mg by mouth daily.  30 tablet  6    BP 130/80  Pulse 72  Temp 98.2 F (36.8 C) (Oral)  Resp 18  Wt 183 lb (83.008 kg)  SpO2 98%       Review of Systems  Constitutional: Negative.   HENT: Negative for hearing loss, congestion, sore throat, rhinorrhea, dental problem, sinus pressure and tinnitus.   Eyes: Negative for pain, discharge and visual disturbance.  Respiratory: Negative for cough and shortness of breath.   Cardiovascular: Negative for chest pain, palpitations and leg swelling.  Gastrointestinal: Negative for nausea, vomiting, abdominal pain, diarrhea, constipation, blood in stool and abdominal distention.  Genitourinary:  Negative for dysuria, urgency, frequency, hematuria, flank pain, vaginal bleeding, vaginal discharge, difficulty urinating, vaginal pain and pelvic pain.  Musculoskeletal: Negative for joint swelling, arthralgias and gait problem.  Skin: Negative for rash.  Neurological: Positive for headaches. Negative for dizziness, syncope, speech difficulty, weakness and numbness.  Hematological: Negative for adenopathy.  Psychiatric/Behavioral: Negative for behavioral problems, dysphoric mood and agitation. The patient is not nervous/anxious.        Objective:   Physical Exam  Constitutional: She is oriented to person, place, and time. She appears well-developed and well-nourished.       Blood pressure 130/80  HENT:  Head: Normocephalic.  Right Ear: External ear normal.  Left Ear: External ear normal.  Mouth/Throat: Oropharynx is clear and moist.  Eyes: Conjunctivae normal and EOM are normal. Pupils are equal, round, and reactive to light.  Neck: Normal range of motion. Neck supple. No thyromegaly present.  Cardiovascular: Normal rate, regular rhythm, normal heart sounds and intact distal pulses.   Pulmonary/Chest: Effort normal and breath sounds normal.  Abdominal: Soft. Bowel sounds are normal. She exhibits no mass. There is no tenderness.  Musculoskeletal: Normal range of motion.  Lymphadenopathy:    She has no cervical adenopathy.  Neurological: She is alert and oriented to person, place, and time.  Skin: Skin is warm and dry. No rash noted.  Psychiatric: She has a normal mood and affect. Her behavior is normal.          Assessment & Plan:   Chronic daily headaches. We'll discontinue verapamil and substitute metoprolol. If headaches persist we'll set him to see a headache specialist Hypertension stable

## 2011-12-22 NOTE — Patient Instructions (Signed)
Discontinue diltiazem/verapamil  Please check your blood pressure on a regular basis.  If it is consistently greater than 150/90, please make an office appointment.  Call or return to clinic prn if these symptoms worsen or fail to improve as anticipated.

## 2011-12-28 ENCOUNTER — Other Ambulatory Visit: Payer: Self-pay | Admitting: Internal Medicine

## 2011-12-29 ENCOUNTER — Ambulatory Visit (INDEPENDENT_AMBULATORY_CARE_PROVIDER_SITE_OTHER)
Admission: RE | Admit: 2011-12-29 | Discharge: 2011-12-29 | Disposition: A | Discharge status: Home / Self Care | Payer: BC Managed Care – PPO | Source: Ambulatory Visit

## 2011-12-29 DIAGNOSIS — Z13820 Encounter for screening for osteoporosis: Secondary | ICD-10-CM

## 2012-01-31 ENCOUNTER — Encounter: Payer: Self-pay | Admitting: Internal Medicine

## 2012-03-11 ENCOUNTER — Encounter: Payer: Self-pay | Admitting: Internal Medicine

## 2012-03-11 ENCOUNTER — Ambulatory Visit (INDEPENDENT_AMBULATORY_CARE_PROVIDER_SITE_OTHER): Payer: BC Managed Care – PPO | Admitting: Internal Medicine

## 2012-03-11 VITALS — BP 132/80 | HR 72 | Temp 98.2°F | Resp 16 | Ht 69.0 in | Wt 184.0 lb

## 2012-03-11 DIAGNOSIS — R51 Headache: Secondary | ICD-10-CM

## 2012-03-11 DIAGNOSIS — Z780 Asymptomatic menopausal state: Secondary | ICD-10-CM

## 2012-03-11 DIAGNOSIS — I10 Essential (primary) hypertension: Secondary | ICD-10-CM

## 2012-03-11 DIAGNOSIS — R519 Headache, unspecified: Secondary | ICD-10-CM

## 2012-03-11 MED ORDER — METHYLPREDNISOLONE ACETATE 80 MG/ML IJ SUSP
80.0000 mg | Freq: Once | INTRAMUSCULAR | Status: AC
  Administered 2012-03-11: 80 mg via INTRAMUSCULAR

## 2012-03-11 MED ORDER — METHYLPREDNISOLONE ACETATE 40 MG/ML IJ SUSP: 40.0000 mg | Freq: Once | INTRAMUSCULAR | Status: DC

## 2012-03-11 NOTE — Progress Notes (Signed)
  Subjective:    Patient ID: Erica Marquez, female    DOB: Jan 24, 1950, 62 y.o.   MRN: [id number]  HPI Blood pressure Hx of head aches Saw ENT and neurology Hx of hypothyroidism Diagnosed with "valsava head aches" Head aches better Trial of indocin   Review of Systems  Constitutional: Negative for activity change, appetite change and fatigue.  HENT: Negative for ear pain, congestion, neck pain, postnasal drip and sinus pressure.   Eyes: Negative for redness and visual disturbance.  Respiratory: Negative for cough, shortness of breath and wheezing.   Gastrointestinal: Negative for abdominal pain and abdominal distention.  Genitourinary: Negative for dysuria, frequency and menstrual problem.  Musculoskeletal: Negative for myalgias, joint swelling and arthralgias.  Skin: Negative for rash and wound.  Neurological: Negative for dizziness, weakness and headaches.  Hematological: Negative for adenopathy. Does not bruise/bleed easily.  Psychiatric/Behavioral: Negative for sleep disturbance and decreased concentration.       Objective:   Physical Exam  Nursing note and vitals reviewed. Constitutional: She appears well-developed and well-nourished. No distress.  HENT:  Head: Normocephalic and atraumatic.  Right Ear: External ear normal.  Left Ear: External ear normal.  Nose: Nose normal.  Mouth/Throat: Oropharynx is clear and moist.  Eyes: Conjunctivae and EOM are normal. Pupils are equal, round, and reactive to light.  Neck: Normal range of motion. Neck supple. No JVD present. No tracheal deviation present. No thyromegaly present.  Cardiovascular: Normal rate, regular rhythm, normal heart sounds and intact distal pulses.   No murmur heard. Pulmonary/Chest: Effort normal and breath sounds normal. She has no wheezes. She exhibits no tenderness.  Musculoskeletal: She exhibits no edema and no tenderness.  Lymphadenopathy:    She has no cervical adenopathy.  Neurological: She has  normal reflexes. No cranial nerve deficit.  Skin: She is not diaphoretic.  Psychiatric: She has a normal mood and affect. Her behavior is normal.          Assessment & Plan:  Treated for head aches with indocin for 2 months to break pattern Indocin as needed Still has sinus pressure and burning in sinuses Allergy consult referral for chronic sinus  Depo medrol shot Stable HTN  Consider resume statin weekly

## 2012-03-11 NOTE — Patient Instructions (Signed)
paleo

## 2012-03-11 NOTE — Addendum Note (Signed)
Addended by: Willy Eddy on: 03/11/2012 05:01 PM   Modules accepted: Orders

## 2012-03-16 ENCOUNTER — Encounter: Payer: Self-pay | Admitting: Internal Medicine

## 2012-03-17 ENCOUNTER — Encounter: Payer: Self-pay | Admitting: Internal Medicine

## 2012-04-04 ENCOUNTER — Telehealth: Payer: Self-pay | Admitting: Internal Medicine

## 2012-04-04 NOTE — Telephone Encounter (Signed)
Patient Information:  Caller Name: Fritzie  Phone: [phone]  Patient: Erica Marquez, Erica Marquez  Gender: Female  DOB: 1950/09/19  Age: 62 Years  PCP: Darryll Capers (Adults only)  Office Follow Up:  Does the office need to follow up with this patient?: No  Instructions For The Office: N/A   Symptoms  Reason For Call & Symptoms: Jennifr states she had drainage in throat on 04/02/12. Sore throat onset 04/02/12. Onset of cough on 04/03/12. Clear nasal drainage. Scratchy throat since 04/02/12. Onset of tight chest on 3/17 . Used Symbicort with some improvement. Chest is sore when she coughs. Has never had a  rescue/ Albuterol inhaler. States she was told she has exercise induced asthma. Requested Zpak be ordered by Dr. Lovell Sheehan. Call came in near 17:00. Explained no antibiotics can be called in without being seen. States if she feels better in AM she will call and cancel appointment. States she was seen 3 weeks ago and was hesitant to schedule appointment as it costs $188.00 for visit.  Reviewed Health History In EMR: Yes  Reviewed Medications In EMR: Yes  Reviewed Allergies In EMR: Yes  Reviewed Surgeries / Procedures: Yes  Date of Onset of Symptoms: 04/02/2012  Treatments Tried: Symbicort, Tessalon Perles  Treatments Tried Worked: No  Guideline(s) Used:  Asthma Attack  Disposition Per Guideline:   See Today or Tomorrow in Office  Reason For Disposition Reached:   Sinus pain (around cheekbone or eye)  Advice Given:  Drinking Liquids:  Try to drink normal amount of liquids (e.g., water). Being adequately hydrated makes it easier to cough up the sticky lung mucus.  Humidifier:   If the air is dry, use a cool mist humidifier to prevent drying of the upper airway.  Remove Allergens:  Take a shower to remove pollens, animal dander, or other allergens from the body and hair.  Patient Will Follow Care Advice:  YES  Appointment Scheduled:  04/05/2012 16:00:00 Appointment Scheduled Provider:  Adline Mango [facility])

## 2012-04-05 ENCOUNTER — Ambulatory Visit (INDEPENDENT_AMBULATORY_CARE_PROVIDER_SITE_OTHER): Payer: BC Managed Care – PPO | Admitting: Family

## 2012-04-05 ENCOUNTER — Encounter: Payer: Self-pay | Admitting: Family

## 2012-04-05 VITALS — BP 102/68 | HR 67 | Temp 98.5°F | Wt 182.0 lb

## 2012-04-05 DIAGNOSIS — J45909 Unspecified asthma, uncomplicated: Secondary | ICD-10-CM

## 2012-04-05 DIAGNOSIS — J309 Allergic rhinitis, unspecified: Secondary | ICD-10-CM

## 2012-04-05 MED ORDER — PREDNISONE 20 MG PO TABS: ORAL_TABLET | ORAL | Status: AC

## 2012-04-05 MED ORDER — AZITHROMYCIN 250 MG PO TABS: ORAL_TABLET | ORAL | Status: DC

## 2012-04-05 NOTE — Telephone Encounter (Signed)
No follow up required, closing encounter. °

## 2012-04-05 NOTE — Patient Instructions (Addendum)

## 2012-04-05 NOTE — Progress Notes (Signed)
Subjective:    Patient ID: Erica Marquez, female    DOB: 05/17/1950, 62 y.o.   MRN: [id number]  HPI  62 year old white female, nonsmoker, patient of Dr. Lovell Sheehan is in today with complaints of cough, congestion, runny nose x3 days. She was seen by Dr. Lovell Sheehan last month with similar symptoms and was given a shot of Depo-Medrol that was ineffective. She has a history of asthma and has had wheezing at night. Denies any environmental changes.    Review of Systems  Constitutional: Negative.   HENT: Positive for congestion, rhinorrhea and postnasal drip.   Respiratory: Positive for choking and wheezing.   Cardiovascular: Negative.   Gastrointestinal: Negative.   Musculoskeletal: Negative.   Skin: Negative.   Allergic/Immunologic: Negative.   Hematological: Negative.   Psychiatric/Behavioral: Negative.    Past Medical History  Diagnosis Date  . Arthritis   . Depression   . Allergy   . Asthma   . GERD (gastroesophageal reflux disease)   . Hypertension   . Hyperlipidemia   . Fibroids   . Diverticulosis of colon   . Blunt head trauma   . Premature ventricular contractions   . IBS (irritable bowel syndrome)     History   Social History  . Marital Status: Married    Spouse Name: N/A    Number of Children: N/A  . Years of Education: N/A   Occupational History  . Not on file.   Social History Main Topics  . Smoking status: Former Games developer  . Smokeless tobacco: Not on file  . Alcohol Use: Yes  . Drug Use:   . Sexually Active: Yes   Other Topics Concern  . Not on file   Social History Narrative  . No narrative on file    Past Surgical History  Procedure Laterality Date  . Breast surgery      fibroid tumors  . Dilation and curettage of uterus      Family History  Problem Relation Age of Onset  . Coronary artery disease      fhx  . Heart disease Mother   . Cancer Father     GI ( stomach)    No Known Allergies  Current Outpatient Prescriptions on File Prior  to Visit  Medication Sig Dispense Refill  . benzonatate (TESSALON) 100 MG capsule TAKE 1 CAPSULE BY MOUTH 3TIMES DAILY AS NEEDED FORCOUGH  20 capsule  0  . budesonide-formoterol (SYMBICORT) 80-4.5 MCG/ACT inhaler Inhale 2 puffs into the lungs 2 (two) times daily as needed.  3 Inhaler  3  . desvenlafaxine (PRISTIQ) 100 MG 24 hr tablet Take 1 tablet (100 mg total) by mouth daily.  90 tablet  3  . levothyroxine (SYNTHROID) 137 MCG tablet Take 1 tablet (137 mcg total) by mouth daily.  90 tablet  3  . losartan-hydrochlorothiazide (HYZAAR) 50-12.5 MG per tablet Take 1 tablet by mouth daily.  90 tablet  3  . methylphenidate (CONCERTA) 36 MG CR tablet Take 36 mg by mouth daily as needed.      . metoprolol succinate (TOPROL-XL) 50 MG 24 hr tablet Take 1 tablet (50 mg total) by mouth daily. Take with or immediately following a meal.  90 tablet  3  . Probiotic Product (ALIGN PO) Take by mouth daily.      . Psyllium (METAMUCIL PO) Take by mouth daily.      . traZODone (DESYREL) 50 MG tablet       . indomethacin (INDOCIN SR) 75 MG CR capsule Take  75 mg by mouth daily with breakfast.      . loratadine (CLARITIN) 10 MG tablet Take 1 tablet (10 mg total) by mouth daily.  30 tablet  11  . [DISCONTINUED] Amlodipine-Valsartan-HCTZ 5-160-12.5 MG TABS Take 1 tablet by mouth daily.  90 tablet  3  . [DISCONTINUED] Rivaroxaban 20 MG TABS Take 20 mg by mouth daily.  30 tablet  6   No current facility-administered medications on file prior to visit.    BP 102/68  Pulse 67  Temp(Src) 98.5 F (36.9 C) (Oral)  Wt 182 lb (82.555 kg)  BMI 26.86 kg/m2  SpO2 98%chart    Objective:   Physical Exam  Constitutional: She is oriented to person, place, and time. She appears well-developed and well-nourished.  HENT:  Right Ear: External ear normal.  Left Ear: External ear normal.  Nose: Nose normal.  Mouth/Throat: Oropharynx is clear and moist.  Neck: Normal range of motion. Neck supple.  Cardiovascular: Normal rate,  regular rhythm and normal heart sounds.   Pulmonary/Chest: Effort normal and breath sounds normal.  Musculoskeletal: Normal range of motion.  Neurological: She is alert and oriented to person, place, and time.  Skin: Skin is warm and dry.  Psychiatric: She has a normal mood and affect.          Assessment & Plan:  Assessment: 1. Allergic rhinitis 2. Asthma  Plan: Prednisone 60x3, 40x3, 20x3. Patient to call the office if symptoms worsen or persist. Z-Pak given if her symptoms worsen. Recheck a schedule, and as needed.

## 2012-04-18 ENCOUNTER — Telehealth: Payer: Self-pay | Admitting: Internal Medicine

## 2012-04-18 MED ORDER — METOPROLOL SUCCINATE ER 50 MG PO TB24: 50.0000 mg | ORAL_TABLET | Freq: Every day | ORAL | Status: DC

## 2012-04-18 MED ORDER — METHYLPHENIDATE HCL ER (OSM) 36 MG PO TBCR: 36.0000 mg | EXTENDED_RELEASE_TABLET | Freq: Every day | ORAL | Status: DC | PRN

## 2012-04-18 NOTE — Telephone Encounter (Signed)
Pt called to request RX script for her methylphenidate (CONCERTA) 36 MG CR tablet. She stated that she will be in the neighborhood, and would like to pick it up on Friday. Please assist.

## 2012-04-18 NOTE — Telephone Encounter (Signed)
Done adn Left message on machine Will be ready for pick up on friday

## 2012-04-26 ENCOUNTER — Encounter: Payer: Self-pay | Admitting: Internal Medicine

## 2012-06-20 ENCOUNTER — Encounter: Payer: Self-pay | Admitting: Internal Medicine

## 2012-06-20 ENCOUNTER — Ambulatory Visit (INDEPENDENT_AMBULATORY_CARE_PROVIDER_SITE_OTHER): Payer: BC Managed Care – PPO | Admitting: Internal Medicine

## 2012-06-20 VITALS — BP 130/80 | HR 72 | Temp 98.2°F | Resp 16 | Ht 69.0 in | Wt 179.0 lb

## 2012-06-20 DIAGNOSIS — I10 Essential (primary) hypertension: Secondary | ICD-10-CM

## 2012-06-20 DIAGNOSIS — N951 Menopausal and female climacteric states: Secondary | ICD-10-CM

## 2012-06-20 DIAGNOSIS — J309 Allergic rhinitis, unspecified: Secondary | ICD-10-CM

## 2012-06-20 DIAGNOSIS — E039 Hypothyroidism, unspecified: Secondary | ICD-10-CM

## 2012-06-20 DIAGNOSIS — E785 Hyperlipidemia, unspecified: Secondary | ICD-10-CM

## 2012-06-20 LAB — BASIC METABOLIC PANEL
Calcium: 9.4 mg/dL (ref 8.4–10.5)
GFR: 84.01 mL/min (ref >60.00)
Glucose, Bld: 93 mg/dL (ref 70–99)
Sodium: 141 mEq/L (ref 135–145)

## 2012-06-20 LAB — MAGNESIUM: Magnesium: 2.2 mg/dL (ref 1.5–2.5)

## 2012-06-20 LAB — TSH: TSH: 1.67 u[IU]/mL (ref 0.35–5.50)

## 2012-06-20 LAB — LIPID PANEL
HDL: 71.3 mg/dL (ref >39.00)
Total CHOL/HDL Ratio: 3

## 2012-06-20 LAB — T3, FREE: T3, Free: 2.6 pg/mL (ref 2.3–4.2)

## 2012-06-20 MED ORDER — IPRATROPIUM BROMIDE 0.03 % NA SOLN: 2.0000 | Freq: Two times a day (BID) | NASAL | Status: DC

## 2012-06-20 NOTE — Progress Notes (Signed)
Subjective:    Patient ID: Erica Marquez, female    DOB: Feb 22, 1950, 62 y.o.   MRN: [id number]  HPI Patient has a chief complaint today of intermittent sweating.  The timing of the sweating appears to be correlated with the use of pristiq.  Patient also has risks for menopausal symptomatology.  Patient is also on a beta blocker.  Patient has symptoms of was moderately severe allergic rhinitis with vasomotor rhinitis she's been to an allergist and has been placed on Singulair and nasal spray protocol this has alleviated his symptomatology but resulted in excessive dryness when she adds an over-the-counter antihistamine into the regimen.     Review of Systems  Constitutional: Negative for activity change, appetite change and fatigue.  HENT: Negative for ear pain, congestion, neck pain, postnasal drip and sinus pressure.   Eyes: Negative for redness and visual disturbance.  Respiratory: Negative for cough, shortness of breath and wheezing.   Gastrointestinal: Negative for abdominal pain and abdominal distention.  Genitourinary: Negative for dysuria, frequency and menstrual problem.  Musculoskeletal: Negative for myalgias, joint swelling and arthralgias.  Skin: Negative for rash and wound.  Neurological: Negative for dizziness, weakness and headaches.  Hematological: Negative for adenopathy. Does not bruise/bleed easily.  Psychiatric/Behavioral: Negative for sleep disturbance and decreased concentration.   Past Medical History  Diagnosis Date  . Arthritis   . Depression   . Allergy   . Asthma   . GERD (gastroesophageal reflux disease)   . Hypertension   . Hyperlipidemia   . Fibroids   . Diverticulosis of colon   . Blunt head trauma   . Premature ventricular contractions   . IBS (irritable bowel syndrome)     History   Social History  . Marital Status: Married    Spouse Name: N/A    Number of Children: N/A  . Years of Education: N/A   Occupational History  . Not on  file.   Social History Main Topics  . Smoking status: Former Games developer  . Smokeless tobacco: Not on file  . Alcohol Use: Yes  . Drug Use:   . Sexually Active: Yes   Other Topics Concern  . Not on file   Social History Narrative  . No narrative on file    Past Surgical History  Procedure Laterality Date  . Breast surgery      fibroid tumors  . Dilation and curettage of uterus      Family History  Problem Relation Age of Onset  . Coronary artery disease      fhx  . Heart disease Mother   . Cancer Father     GI ( stomach)    No Known Allergies  Current Outpatient Prescriptions on File Prior to Visit  Medication Sig Dispense Refill  . benzonatate (TESSALON) 100 MG capsule TAKE 1 CAPSULE BY MOUTH 3TIMES DAILY AS NEEDED FORCOUGH  20 capsule  0  . budesonide-formoterol (SYMBICORT) 80-4.5 MCG/ACT inhaler Inhale 2 puffs into the lungs 2 (two) times daily as needed.  3 Inhaler  3  . desvenlafaxine (PRISTIQ) 100 MG 24 hr tablet Take 1 tablet (100 mg total) by mouth daily.  90 tablet  3  . levothyroxine (SYNTHROID) 137 MCG tablet Take 1 tablet (137 mcg total) by mouth daily.  90 tablet  3  . losartan-hydrochlorothiazide (HYZAAR) 50-12.5 MG per tablet Take 1 tablet by mouth daily.  90 tablet  3  . methylphenidate (CONCERTA) 36 MG CR tablet Take 1 tablet (36 mg total) by mouth  daily as needed.  30 tablet  0  . metoprolol succinate (TOPROL-XL) 50 MG 24 hr tablet Take 1 tablet (50 mg total) by mouth daily. Take with or immediately following a meal.  30 tablet  3  . Probiotic Product (ALIGN PO) Take by mouth daily.      . traZODone (DESYREL) 50 MG tablet       . [DISCONTINUED] Amlodipine-Valsartan-HCTZ 5-160-12.5 MG TABS Take 1 tablet by mouth daily.  90 tablet  3  . [DISCONTINUED] Rivaroxaban 20 MG TABS Take 20 mg by mouth daily.  30 tablet  6   No current facility-administered medications on file prior to visit.    BP 130/80  Pulse 72  Temp(Src) 98.2 F (36.8 C)  Resp 16  Ht 5'  9" (1.753 m)  Wt 179 lb (81.194 kg)  BMI 26.42 kg/m2       Objective:   Physical Exam  Nursing note and vitals reviewed. Constitutional: She is oriented to person, place, and time. She appears well-developed and well-nourished.  Eyes: Conjunctivae are normal. Pupils are equal, round, and reactive to light.  Cardiovascular: Normal rate and regular rhythm.   Pulmonary/Chest: Effort normal and breath sounds normal.  Abdominal: Soft. Bowel sounds are normal.  Musculoskeletal: Normal range of motion.  Neurological: She is alert and oriented to person, place, and time.  Skin: Skin is warm and dry.  Psychiatric: She has a normal mood and affect. Her behavior is normal.          Assessment & Plan:  Needs shingles vaccine HTN stable Add Atrovent nasal spray for vasomotor rhinitis check screening blood work for thyroid and lipids

## 2012-06-20 NOTE — Patient Instructions (Addendum)
Adjust the pristiq to 50 mg a day for about a week to see if there is a difference in the swelling and if so and her mood is still stable stay on the 50 mg of pristiq if there is no change in his sweating but it's not that is related any need to stay on 100 of pristiq

## 2012-09-16 ENCOUNTER — Ambulatory Visit: Payer: BC Managed Care – PPO | Admitting: Internal Medicine

## 2012-09-26 ENCOUNTER — Encounter: Payer: Self-pay | Admitting: Internal Medicine

## 2012-09-26 ENCOUNTER — Ambulatory Visit (INDEPENDENT_AMBULATORY_CARE_PROVIDER_SITE_OTHER): Payer: BC Managed Care – PPO | Admitting: Internal Medicine

## 2012-09-26 VITALS — BP 127/78 | HR 65 | Ht 69.0 in | Wt 182.4 lb

## 2012-09-26 DIAGNOSIS — R079 Chest pain, unspecified: Secondary | ICD-10-CM

## 2012-09-26 DIAGNOSIS — I493 Ventricular premature depolarization: Secondary | ICD-10-CM

## 2012-09-26 DIAGNOSIS — I10 Essential (primary) hypertension: Secondary | ICD-10-CM

## 2012-09-26 DIAGNOSIS — I4949 Other premature depolarization: Secondary | ICD-10-CM

## 2012-09-26 LAB — BASIC METABOLIC PANEL
Calcium: 9.2 mg/dL (ref 8.4–10.5)
GFR: 92.54 mL/min (ref >60.00)
Glucose, Bld: 88 mg/dL (ref 70–99)
Sodium: 139 mEq/L (ref 135–145)

## 2012-09-26 LAB — MAGNESIUM: Magnesium: 2.2 mg/dL (ref 1.5–2.5)

## 2012-09-26 MED ORDER — METOPROLOL SUCCINATE ER 50 MG PO TB24: 50.0000 mg | ORAL_TABLET | Freq: Every day | ORAL | Status: DC

## 2012-09-26 NOTE — Assessment & Plan Note (Signed)
Infrequent. We'll check potassium and levels as the former was low in June. It was addressed with diet recommendation.

## 2012-09-26 NOTE — Assessment & Plan Note (Signed)
Well-controlled; we'll need to check potassium and magnesium levels

## 2012-09-26 NOTE — Progress Notes (Signed)
Patient Care Team: Stacie Glaze, MD as PCP - General   HPI  Erica Marquez is a 62 y.o. female seen in followup for RVOT PVCs and Hypertension  She also carries a history of atrial fibrillation that I had given her associated with popcorn sensations. After reviewing all that i could find with her name on it i could find nothing with atrial fibrillation-- i dont know how I made the error    For PVCs are much improved. She continues to have been occasionally and this has resumed following introduction of the asthmatic medications.  June 2014 her potassium level was 3.3 and magnesium level is 2.2..  She has brief fleeting chest pains lasting seconds unrelated to exertion   .    Past Medical History  Diagnosis Date  . Arthritis   . Depression   . Allergy   . Asthma   . GERD (gastroesophageal reflux disease)   . Hypertension   . Hyperlipidemia   . Fibroids   . Diverticulosis of colon   . Blunt head trauma   . Premature ventricular contractions   . IBS (irritable bowel syndrome)     Past Surgical History  Procedure Laterality Date  . Breast surgery      fibroid tumors  . Dilation and curettage of uterus      Current Outpatient Prescriptions  Medication Sig Dispense Refill  . benzonatate (TESSALON) 100 MG capsule TAKE 1 CAPSULE BY MOUTH 3TIMES DAILY AS NEEDED FORCOUGH  20 capsule  0  . budesonide-formoterol (SYMBICORT) 80-4.5 MCG/ACT inhaler Inhale 2 puffs into the lungs 2 (two) times daily as needed.  3 Inhaler  3  . desvenlafaxine (PRISTIQ) 100 MG 24 hr tablet Take 1 tablet (100 mg total) by mouth daily.  90 tablet  3  . ipratropium (ATROVENT) 0.03 % nasal spray Place 2 sprays into the nose every 12 (twelve) hours.  30 mL  12  . levocetirizine (XYZAL) 5 MG tablet Take 5 mg by mouth every evening.      Marland Kitchen levothyroxine (SYNTHROID) 137 MCG tablet Take 1 tablet (137 mcg total) by mouth daily.  90 tablet  3  . losartan-hydrochlorothiazide (HYZAAR) 50-12.5 MG per tablet Take  1 tablet by mouth daily.  90 tablet  3  . methylphenidate (CONCERTA) 36 MG CR tablet Take 1 tablet (36 mg total) by mouth daily as needed.  30 tablet  0  . metoprolol succinate (TOPROL-XL) 50 MG 24 hr tablet Take 1 tablet (50 mg total) by mouth daily. Take with or immediately following a meal.  30 tablet  3  . montelukast (SINGULAIR) 10 MG tablet Take 10 mg by mouth at bedtime.      . Probiotic Product (ALIGN PO) Take by mouth daily.      . traZODone (DESYREL) 50 MG tablet       . [DISCONTINUED] Amlodipine-Valsartan-HCTZ 5-160-12.5 MG TABS Take 1 tablet by mouth daily.  90 tablet  3  . [DISCONTINUED] Rivaroxaban 20 MG TABS Take 20 mg by mouth daily.  30 tablet  6   No current facility-administered medications for this visit.    No Known Allergies  Review of Systems negative except from HPI and PMH  Physical Exam BP 127/78  Pulse 65  Ht 5\' 9"  (1.753 m)  Wt 182 lb 6.4 oz (82.736 kg)  BMI 26.92 kg/m2 Well developed and nourished in no acute distress HENT normal Neck supple with JVP-flat Clear Regular rate and rhythm, no murmurs or gallops Abd-soft with  active BS No Clubbing cyanosis edema Skin-warm and dry A & Oriented  Grossly normal sensory and motor function   ECG demonstrates sinus rhythm at 65 Intervals 14/08/40 Axis is 46  Assessment and  Plan

## 2012-09-26 NOTE — Patient Instructions (Addendum)
Your physician recommends that you continue on your current medications as directed. Please refer to the Current Medication list given to you today.  Your physician recommends that you have  lab work today: BMET & Magnesium   Your physician wants you to follow-up in: one year with Dr. Graciela Husbands.  You will receive a reminder letter in the mail two months in advance. If you don't receive a letter, please call our office to schedule the follow-up appointment.

## 2012-09-26 NOTE — Assessment & Plan Note (Signed)
Atypical. Do not think it is cardiac.

## 2012-10-24 ENCOUNTER — Encounter: Payer: Self-pay | Admitting: Internal Medicine

## 2012-10-24 ENCOUNTER — Ambulatory Visit (INDEPENDENT_AMBULATORY_CARE_PROVIDER_SITE_OTHER): Payer: BC Managed Care – PPO | Admitting: Internal Medicine

## 2012-10-24 VITALS — BP 130/80 | HR 72 | Temp 98.3°F | Resp 16 | Ht 69.0 in | Wt 186.0 lb

## 2012-10-24 DIAGNOSIS — E038 Other specified hypothyroidism: Secondary | ICD-10-CM

## 2012-10-24 DIAGNOSIS — Z23 Encounter for immunization: Secondary | ICD-10-CM

## 2012-10-24 DIAGNOSIS — E876 Hypokalemia: Secondary | ICD-10-CM

## 2012-10-24 DIAGNOSIS — G479 Sleep disorder, unspecified: Secondary | ICD-10-CM

## 2012-10-24 DIAGNOSIS — F988 Other specified behavioral and emotional disorders with onset usually occurring in childhood and adolescence: Secondary | ICD-10-CM

## 2012-10-24 DIAGNOSIS — F4323 Adjustment disorder with mixed anxiety and depressed mood: Secondary | ICD-10-CM

## 2012-10-24 DIAGNOSIS — J45909 Unspecified asthma, uncomplicated: Secondary | ICD-10-CM

## 2012-10-24 DIAGNOSIS — I10 Essential (primary) hypertension: Secondary | ICD-10-CM

## 2012-10-24 MED ORDER — BUDESONIDE-FORMOTEROL FUMARATE 160-4.5 MCG/ACT IN AERO: 2.0000 | INHALATION_SPRAY | Freq: Two times a day (BID) | RESPIRATORY_TRACT | Status: DC

## 2012-10-24 MED ORDER — DESVENLAFAXINE SUCCINATE ER 100 MG PO TB24: 100.0000 mg | ORAL_TABLET | Freq: Every day | ORAL | Status: DC

## 2012-10-24 MED ORDER — LEVOTHYROXINE SODIUM 137 MCG PO TABS: 137.0000 ug | ORAL_TABLET | Freq: Every day | ORAL | Status: DC

## 2012-10-24 MED ORDER — METHYLPHENIDATE HCL ER (OSM) 36 MG PO TBCR: 36.0000 mg | EXTENDED_RELEASE_TABLET | Freq: Every day | ORAL | Status: DC | PRN

## 2012-10-24 MED ORDER — ESZOPICLONE 3 MG PO TABS: 3.0000 mg | ORAL_TABLET | Freq: Every day | ORAL | Status: DC

## 2012-10-24 MED ORDER — LOSARTAN POTASSIUM-HCTZ 50-12.5 MG PO TABS: 1.0000 | ORAL_TABLET | Freq: Every day | ORAL | Status: DC

## 2012-10-24 MED ORDER — POTASSIUM CHLORIDE ER 8 MEQ PO TBCR: 8.0000 meq | EXTENDED_RELEASE_TABLET | Freq: Every day | ORAL | Status: DC

## 2012-10-24 NOTE — Progress Notes (Signed)
Subjective:    Patient ID: Erica Marquez, female    DOB: 24-May-1950, 62 y.o.   MRN: [id number]  HPI Groggy from the increased dose of the tricyclic Circadian cycle disruption Pattern  And sleep hygene discussed htn stable Asthma stable... But still coughs Using inhaler  persistent low grade hypokalemia       Review of Systems  Constitutional: Negative for activity change, appetite change and fatigue.  HENT: Positive for congestion, rhinorrhea and postnasal drip. Negative for ear pain, neck pain and sinus pressure.   Eyes: Negative for redness and visual disturbance.  Respiratory: Negative for cough, shortness of breath and wheezing.   Cardiovascular: Positive for leg swelling.       Trace edema  Gastrointestinal: Negative for abdominal pain and abdominal distention.  Genitourinary: Negative for dysuria, frequency and menstrual problem.  Musculoskeletal: Negative for myalgias, joint swelling and arthralgias.  Skin: Negative for rash and wound.  Neurological: Negative for dizziness, weakness and headaches.  Hematological: Negative for adenopathy. Does not bruise/bleed easily.  Psychiatric/Behavioral: Negative for sleep disturbance and decreased concentration.   Past Medical History  Diagnosis Date  . Arthritis   . Depression   . Allergy   . Asthma   . GERD (gastroesophageal reflux disease)   . Hypertension   . Hyperlipidemia   . Fibroids   . Diverticulosis of colon   . Blunt head trauma   . Premature ventricular contractions   . IBS (irritable bowel syndrome)     History   Social History  . Marital Status: Married    Spouse Name: N/A    Number of Children: N/A  . Years of Education: N/A   Occupational History  . Not on file.   Social History Main Topics  . Smoking status: Former Games developer  . Smokeless tobacco: Not on file  . Alcohol Use: Yes  . Drug Use:   . Sexual Activity: Yes   Other Topics Concern  . Not on file   Social History Narrative  . No  narrative on file    Past Surgical History  Procedure Laterality Date  . Breast surgery      fibroid tumors  . Dilation and curettage of uterus      Family History  Problem Relation Age of Onset  . Coronary artery disease      fhx  . Heart disease Mother   . Cancer Father     GI ( stomach)    No Known Allergies  Current Outpatient Prescriptions on File Prior to Visit  Medication Sig Dispense Refill  . benzonatate (TESSALON) 100 MG capsule TAKE 1 CAPSULE BY MOUTH 3TIMES DAILY AS NEEDED FORCOUGH  20 capsule  0  . desvenlafaxine (PRISTIQ) 100 MG 24 hr tablet Take 1 tablet (100 mg total) by mouth daily.  90 tablet  3  . levocetirizine (XYZAL) 5 MG tablet Take 5 mg by mouth every evening.      Marland Kitchen levothyroxine (SYNTHROID) 137 MCG tablet Take 1 tablet (137 mcg total) by mouth daily.  90 tablet  3  . losartan-hydrochlorothiazide (HYZAAR) 50-12.5 MG per tablet Take 1 tablet by mouth daily.  90 tablet  3  . methylphenidate (CONCERTA) 36 MG CR tablet Take 1 tablet (36 mg total) by mouth daily as needed.  30 tablet  0  . metoprolol succinate (TOPROL-XL) 50 MG 24 hr tablet Take 1 tablet (50 mg total) by mouth daily. Take with or immediately following a meal.  30 tablet  3  . montelukast (  SINGULAIR) 10 MG tablet Take 10 mg by mouth at bedtime.      . Probiotic Product (ALIGN PO) Take by mouth daily.      . [DISCONTINUED] Amlodipine-Valsartan-HCTZ 5-160-12.5 MG TABS Take 1 tablet by mouth daily.  90 tablet  3  . [DISCONTINUED] Rivaroxaban 20 MG TABS Take 20 mg by mouth daily.  30 tablet  6   No current facility-administered medications on file prior to visit.    BP 130/80  Pulse 72  Temp(Src) 98.3 F (36.8 C)  Resp 16  Ht 5\' 9"  (1.753 m)  Wt 186 lb (84.369 kg)  BMI 27.45 kg/m2   '     Objective:   Physical Exam  Constitutional: She is oriented to person, place, and time. She appears well-developed and well-nourished. No distress.  HENT:  Head: Normocephalic and atraumatic.   Left Ear: External ear normal.  Eyes: Conjunctivae and EOM are normal. Pupils are equal, round, and reactive to light.  Neck: Normal range of motion. Neck supple. No JVD present. No tracheal deviation present. No thyromegaly present.  Cardiovascular: Normal rate, regular rhythm and intact distal pulses.   Murmur heard. Pulmonary/Chest: Effort normal and breath sounds normal. She has no wheezes. She exhibits no tenderness.  Abdominal: Soft. Bowel sounds are normal.  Musculoskeletal: Normal range of motion. She exhibits edema. She exhibits no tenderness.  Lymphadenopathy:    She has no cervical adenopathy.  Neurological: She is alert and oriented to person, place, and time. She has normal reflexes. No cranial nerve deficit.  Skin: Skin is warm and dry. She is not diaphoretic.  Psychiatric: She has a normal mood and affect. Her behavior is normal.          Assessment & Plan:  HTN stable Insomnia with  circadian rhythm disturbance Stable asthma Hypokalemia

## 2012-11-18 ENCOUNTER — Other Ambulatory Visit: Payer: Self-pay | Admitting: *Deleted

## 2012-11-18 MED ORDER — LORAZEPAM 0.5 MG PO TABS: 0.5000 mg | ORAL_TABLET | Freq: Two times a day (BID) | ORAL | Status: DC | PRN

## 2012-12-20 ENCOUNTER — Other Ambulatory Visit: Payer: Self-pay | Admitting: Internal Medicine

## 2012-12-26 ENCOUNTER — Other Ambulatory Visit: Payer: Self-pay | Admitting: Internal Medicine

## 2012-12-27 ENCOUNTER — Other Ambulatory Visit: Payer: Self-pay | Admitting: Internal Medicine

## 2013-01-05 ENCOUNTER — Other Ambulatory Visit (HOSPITAL_COMMUNITY): Payer: Self-pay | Admitting: Obstetrics & Gynecology

## 2013-01-05 DIAGNOSIS — D219 Benign neoplasm of connective and other soft tissue, unspecified: Secondary | ICD-10-CM

## 2013-01-09 ENCOUNTER — Ambulatory Visit (HOSPITAL_COMMUNITY): Payer: BC Managed Care – PPO

## 2013-01-09 ENCOUNTER — Other Ambulatory Visit: Payer: Self-pay | Admitting: Internal Medicine

## 2013-01-10 ENCOUNTER — Ambulatory Visit (HOSPITAL_COMMUNITY): Payer: BC Managed Care – PPO

## 2013-01-11 ENCOUNTER — Ambulatory Visit (HOSPITAL_COMMUNITY)
Admission: RE | Admit: 2013-01-11 | Discharge: 2013-01-11 | Disposition: A | Discharge status: Home / Self Care | Payer: BC Managed Care – PPO | Source: Ambulatory Visit | Attending: Obstetrics & Gynecology | Admitting: Obstetrics & Gynecology

## 2013-01-11 DIAGNOSIS — D219 Benign neoplasm of connective and other soft tissue, unspecified: Secondary | ICD-10-CM

## 2013-01-11 DIAGNOSIS — D252 Subserosal leiomyoma of uterus: Secondary | ICD-10-CM | POA: Insufficient documentation

## 2013-01-17 ENCOUNTER — Other Ambulatory Visit: Payer: Self-pay | Admitting: Obstetrics & Gynecology

## 2013-01-18 ENCOUNTER — Telehealth: Payer: Self-pay | Admitting: Internal Medicine

## 2013-01-18 ENCOUNTER — Other Ambulatory Visit: Payer: Self-pay | Admitting: *Deleted

## 2013-01-18 MED ORDER — OSELTAMIVIR PHOSPHATE 75 MG PO CAPS: 75.0000 mg | ORAL_CAPSULE | Freq: Two times a day (BID) | ORAL | Status: DC

## 2013-01-18 NOTE — Telephone Encounter (Signed)
Patient Information:  Caller Name: Yuriana  Phone: [phone]  Patient: Erica Marquez, Erica Marquez  Gender: Female  DOB: 11/08/1950  Age: 62 Years  PCP: Darryll Capers (Adults only)  Office Follow Up:  Does the office need to follow up with this patient?: Yes  Instructions For The Office: Pharmacy [facility] in [place]-[phone].  Please contact for request of Tamiflu.  RN Note:  Pharmacy [facility] in [place]-[phone].  Please contact for request of Tamiflu.  Symptoms  Reason For Call & Symptoms: Patient states onset of illness yesterday at 14:00.  +body aches, chills. Temperature 100.2 (o). +runny nose, +cough non productive, + headache, no sore throat.  +History of asthma. . Flu shot . She states her grandchildren visited during [place] and had just got over the flu. She is requesting Tamiflu  Reviewed Health History In EMR: Yes  Reviewed Medications In EMR: Yes  Reviewed Allergies In EMR: Yes  Reviewed Surgeries / Procedures: Yes  Date of Onset of Symptoms: 01/17/2013  Treatments Tried: Tylenol  Treatments Tried Worked: No  Any Fever: Yes  Fever Taken: Oral  Fever Time Of Reading: 03:00:00  Fever Last Reading: 100.2  Guideline(s) Used:  Influenza - Seasonal  Disposition Per Guideline:   Discuss with PCP and Callback by Nurse Today  Reason For Disposition Reached:   Patient requests antiviral medicine for influenza and flu symptoms present < 48 hours  Advice Given:  Reassurance  For most healthy adults, influenza feels like a bad cold. The dangers of influenza for normal, healthy people (under 41 years of age) are overrated.  The treatment of influenza depends on your main symptoms. Generally, treatment is the same as for other viral respiratory infections (colds). Bed rest is unnecessary.  Here is some care advice that should help.  Treating the Symptoms of Flu  Fever, Muscle Aches, and Headache: For fever more than 101 F (38.3 C), muscle aches, and headaches, take  acetaminophen every 4-6 hours (Adults 650 mg) OR ibuprofen every 6-8 hours (Adults 400-600 mg).  Sore Throat: Use throat lozenges, hard candy or warm chicken broth.  Cough: Use cough drops.  Hydrate: Drink extra liquids. If the air in your home is dry, use a humidifier.  Isolation is Needed Until After the Fever is Gone:   The CDC recommends that people with influenza-like illness remain at home until at least 24 hours after they are free of fever (100 F or 37.8C).  Do NOT go to work or school.  Do NOT go to church, child care centers, shopping, or other public places.  Do NOT shake hands.  Avoid close contact with others (hugging, kissing).  Expected Course  : The fever lasts 2-3 days, the runny nose 5-10 days, and the cough 2-3 weeks.  Call Back If:  Fever lasts more than 3 days  You become short of breath or worse.  RN Overrode Recommendation:  Patient Requests Prescription  Pharmacy [facility] in [place]-[phone].  Please contact for request of Tamiflu.

## 2013-01-18 NOTE — Telephone Encounter (Signed)
Per dr Lovell Sheehan- tamiflu 75 bid for 5 days- pt informed

## 2013-01-18 NOTE — Telephone Encounter (Signed)
done

## 2013-02-20 ENCOUNTER — Other Ambulatory Visit: Payer: Self-pay | Admitting: Internal Medicine

## 2013-03-15 ENCOUNTER — Other Ambulatory Visit: Payer: Self-pay | Admitting: Internal Medicine

## 2013-03-24 ENCOUNTER — Other Ambulatory Visit: Payer: Self-pay | Admitting: Allergy

## 2013-03-24 ENCOUNTER — Ambulatory Visit
Admission: RE | Admit: 2013-03-24 | Discharge: 2013-03-24 | Disposition: A | Discharge status: Home / Self Care | Payer: No Typology Code available for payment source | Source: Ambulatory Visit | Attending: Allergy | Admitting: Allergy

## 2013-03-24 DIAGNOSIS — J209 Acute bronchitis, unspecified: Secondary | ICD-10-CM

## 2013-03-31 ENCOUNTER — Telehealth: Payer: Self-pay | Admitting: Internal Medicine

## 2013-03-31 NOTE — Telephone Encounter (Signed)
FYI

## 2013-03-31 NOTE — Telephone Encounter (Signed)
Patient Information:  Caller Name: Genevie  Phone: [phone]  Patient: Erica Marquez, Erica Marquez  Gender: Female  DOB: [birth year]  Age: 63 Years  PCP: Benay Pillow (Adults only)  Office Follow Up:  Does the office need to follow up with this patient?: No  Instructions For The Office: N/A  RN Note:  Pt states she is going out of town Monday and will call back if symptoms have not improved when she returns home.  Symptoms  Reason For Call & Symptoms: Pt report she has asthma and allergy issues. Pt reports she has a salty taste when she  coughs up phlegm.  Reviewed Health History In EMR: Yes  Reviewed Medications In EMR: Yes  Reviewed Allergies In EMR: Yes  Reviewed Surgeries / Procedures: Yes  Date of Onset of Symptoms: 03/24/2013  Guideline(s) Used:  Cough  Disposition Per Guideline:   See Within 3 Days in Office  Reason For Disposition Reached:   Nasal discharge present > 10 days  Advice Given:  Call Back If:  Cough lasts more than 3 weeks  You become worse  Patient Refused Recommendation:  Patient Refused Appt, Patient Requests Appt At Later Date  Going out of town will call back later.

## 2013-04-17 ENCOUNTER — Other Ambulatory Visit: Payer: No Typology Code available for payment source

## 2013-04-17 ENCOUNTER — Encounter: Payer: Self-pay | Admitting: Internal Medicine

## 2013-04-18 ENCOUNTER — Other Ambulatory Visit (INDEPENDENT_AMBULATORY_CARE_PROVIDER_SITE_OTHER): Payer: BC Managed Care – PPO

## 2013-04-18 ENCOUNTER — Encounter: Payer: Self-pay | Admitting: *Deleted

## 2013-04-18 DIAGNOSIS — Z0000 Encounter for general adult medical examination without abnormal findings: Secondary | ICD-10-CM

## 2013-04-18 LAB — LIPID PANEL
CHOL/HDL RATIO: 3
Cholesterol: 181 mg/dL (ref 0–200)
HDL: 59.7 mg/dL (ref >39.00)
LDL Cholesterol: 100 mg/dL — ABNORMAL HIGH (ref 0–99)
Triglycerides: 108 mg/dL (ref 0.0–149.0)
VLDL: 21.6 mg/dL (ref 0.0–40.0)

## 2013-04-18 LAB — HEPATIC FUNCTION PANEL
ALBUMIN: 4 g/dL (ref 3.5–5.2)
ALT: 31 U/L (ref 0–35)
AST: 23 U/L (ref 0–37)
Alkaline Phosphatase: 72 U/L (ref 39–117)
Bilirubin, Direct: 0.1 mg/dL (ref 0.0–0.3)
Total Bilirubin: 0.6 mg/dL (ref 0.3–1.2)
Total Protein: 7.1 g/dL (ref 6.0–8.3)

## 2013-04-18 LAB — BASIC METABOLIC PANEL
BUN: 14 mg/dL (ref 6–23)
CALCIUM: 9.4 mg/dL (ref 8.4–10.5)
CO2: 32 meq/L (ref 19–32)
CREATININE: 0.8 mg/dL (ref 0.4–1.2)
Chloride: 103 mEq/L (ref 96–112)
GFR: 75.49 mL/min (ref >60.00)
GLUCOSE: 97 mg/dL (ref 70–99)
Potassium: 4.1 mEq/L (ref 3.5–5.1)
Sodium: 141 mEq/L (ref 135–145)

## 2013-04-18 LAB — TSH: TSH: 1.41 u[IU]/mL (ref 0.35–5.50)

## 2013-04-18 LAB — CBC WITH DIFFERENTIAL/PLATELET
BASOS ABS: 0 10*3/uL (ref 0.0–0.1)
Basophils Relative: 0.7 % (ref 0.0–3.0)
EOS ABS: 0.1 10*3/uL (ref 0.0–0.7)
Eosinophils Relative: 1.3 % (ref 0.0–5.0)
HCT: 44.9 % (ref 36.0–46.0)
HEMOGLOBIN: 14.9 g/dL (ref 12.0–15.0)
Lymphocytes Relative: 34 % (ref 12.0–46.0)
Lymphs Abs: 2.2 10*3/uL (ref 0.7–4.0)
MCHC: 33.1 g/dL (ref 30.0–36.0)
MCV: 88.4 fl (ref 78.0–100.0)
Monocytes Absolute: 0.6 10*3/uL (ref 0.1–1.0)
Monocytes Relative: 8.6 % (ref 3.0–12.0)
NEUTROS ABS: 3.6 10*3/uL (ref 1.4–7.7)
NEUTROS PCT: 55.4 % (ref 43.0–77.0)
Platelets: 182 10*3/uL (ref 150.0–400.0)
RBC: 5.08 Mil/uL (ref 3.87–5.11)
RDW: 16 % — ABNORMAL HIGH (ref 11.5–14.6)
WBC: 6.5 10*3/uL (ref 4.5–10.5)

## 2013-04-19 ENCOUNTER — Other Ambulatory Visit: Payer: Self-pay | Admitting: Internal Medicine

## 2013-04-24 ENCOUNTER — Encounter: Payer: Self-pay | Admitting: Internal Medicine

## 2013-04-24 ENCOUNTER — Ambulatory Visit (INDEPENDENT_AMBULATORY_CARE_PROVIDER_SITE_OTHER): Payer: BC Managed Care – PPO | Admitting: Internal Medicine

## 2013-04-24 VITALS — BP 140/90 | HR 59 | Temp 98.7°F | Ht 69.0 in | Wt 187.8 lb

## 2013-04-24 DIAGNOSIS — Z0000 Encounter for general adult medical examination without abnormal findings: Secondary | ICD-10-CM

## 2013-04-24 MED ORDER — DESVENLAFAXINE SUCCINATE ER 100 MG PO TB24: ORAL_TABLET | ORAL | Status: DC

## 2013-04-24 MED ORDER — FLUTICASONE FUROATE-VILANTEROL 100-25 MCG/INH IN AEPB: 1.0000 | INHALATION_SPRAY | Freq: Every day | RESPIRATORY_TRACT | Status: DC

## 2013-04-24 MED ORDER — METOPROLOL SUCCINATE ER 50 MG PO TB24: ORAL_TABLET | ORAL | Status: DC

## 2013-04-24 NOTE — Progress Notes (Signed)
   Subjective:    Patient ID: Erica Marquez, female    DOB: Apr 03, 1950, 63 y.o.   MRN: [id number]  HPI  CPX Flair of asthma  gerd symptoms vs pharyngial irritation  Review of Systems  Constitutional: Negative for activity change, appetite change and fatigue.  HENT: Negative for congestion, ear pain, postnasal drip and sinus pressure.   Eyes: Negative for redness and visual disturbance.  Respiratory: Negative for cough, shortness of breath and wheezing.   Gastrointestinal: Negative for abdominal pain and abdominal distention.  Genitourinary: Negative for dysuria, frequency and menstrual problem.  Musculoskeletal: Negative for arthralgias, joint swelling, myalgias and neck pain.  Skin: Negative for rash and wound.  Neurological: Negative for dizziness, weakness and headaches.  Hematological: Negative for adenopathy. Does not bruise/bleed easily.  Psychiatric/Behavioral: Negative for sleep disturbance and decreased concentration.       Objective:   Physical Exam  Constitutional: She is oriented to person, place, and time. She appears well-developed and well-nourished. No distress.  HENT:  Head: Normocephalic and atraumatic.  Eyes: Conjunctivae and EOM are normal. Pupils are equal, round, and reactive to light.  Neck: Normal range of motion. Neck supple. No JVD present. No tracheal deviation present. No thyromegaly present.  Cardiovascular: Normal rate and regular rhythm.   Murmur heard. Pulmonary/Chest: Effort normal and breath sounds normal. She has no wheezes. She exhibits no tenderness.  Abdominal: Soft. Bowel sounds are normal.  Musculoskeletal: Normal range of motion. She exhibits no edema and no tenderness.  Lymphadenopathy:    She has no cervical adenopathy.  Neurological: She is alert and oriented to person, place, and time. She has normal reflexes. No cranial nerve deficit.  Skin: Skin is warm and dry. She is not diaphoretic.  Psychiatric: She has a normal mood and  affect. Her behavior is normal.          Assessment & Plan:   This is a routine wellness  examination for this patient . I reviewed all health maintenance protocols including mammography, colonoscopy, bone density Needed referrals were placed. Age and diagnosis  appropriate screening labs were ordered. Her immunization history was reviewed and appropriate vaccinations were ordered. Her current medications and allergies were reviewed and needed refills of her chronic medications were ordered. The plan for yearly health maintenance was discussed all orders and referrals were made as appropriate.  Change to breo  Trial of nexium

## 2013-04-24 NOTE — Patient Instructions (Signed)
The patient is instructed to continue all medications as prescribed. Schedule followup with check out clerk upon leaving the clinic  

## 2013-04-24 NOTE — Progress Notes (Signed)
Pre visit review using our clinic review tool, if applicable. No additional management support is needed unless otherwise documented below in the visit note. 

## 2013-04-25 ENCOUNTER — Encounter: Payer: Self-pay | Admitting: Internal Medicine

## 2013-05-08 ENCOUNTER — Encounter: Payer: Self-pay | Admitting: Internal Medicine

## 2013-08-03 DIAGNOSIS — Z1231 Encounter for screening mammogram for malignant neoplasm of breast: Secondary | ICD-10-CM | POA: Diagnosis not present

## 2013-08-16 ENCOUNTER — Encounter: Payer: Self-pay | Admitting: Internal Medicine

## 2013-08-25 ENCOUNTER — Ambulatory Visit (INDEPENDENT_AMBULATORY_CARE_PROVIDER_SITE_OTHER): Payer: Medicare Other | Admitting: Family Medicine

## 2013-08-25 ENCOUNTER — Encounter: Payer: Self-pay | Admitting: Family Medicine

## 2013-08-25 VITALS — BP 100/70 | HR 74 | Temp 97.7°F | Ht 69.0 in | Wt 181.0 lb

## 2013-08-25 DIAGNOSIS — F411 Generalized anxiety disorder: Secondary | ICD-10-CM | POA: Diagnosis not present

## 2013-08-25 DIAGNOSIS — J45909 Unspecified asthma, uncomplicated: Secondary | ICD-10-CM

## 2013-08-25 DIAGNOSIS — F329 Major depressive disorder, single episode, unspecified: Secondary | ICD-10-CM | POA: Diagnosis not present

## 2013-08-25 DIAGNOSIS — K219 Gastro-esophageal reflux disease without esophagitis: Secondary | ICD-10-CM

## 2013-08-25 DIAGNOSIS — F3289 Other specified depressive episodes: Secondary | ICD-10-CM | POA: Diagnosis not present

## 2013-08-25 NOTE — Progress Notes (Signed)
Pre visit review using our clinic review tool, if applicable. No additional management support is needed unless otherwise documented below in the visit note. 

## 2013-08-25 NOTE — Patient Instructions (Addendum)
-  stop the dairy for about 2 weeks  -start the probiotic and ensure taking 40 mg of nexium daily  -continue allergy treatments  -Follow up in 1 month  -wean of the pristiq slowly  -stop the ativan if not using  -try 1/2 dose of the trazadone

## 2013-08-25 NOTE — Progress Notes (Signed)
No chief complaint on file.   HPI:  Upset Stomach/upper resp symptoms: -after ate a huge amount of almonds about 2 weeks ago -symptoms: mild nausea, mild abd discomfort after eating, some loose stools last week, upper resp symptoms too, fatigue -denies: fevers, vomiting, blood in her stools, melena, weight loss, sig abd pain, dysphagia, wheezing, sinus pain, ear pain -takes nexium, antihistamine and symbicort -has hx asthma and allergies -had prediabetes on labs a few weeks ago -sees allergy and asthma specialist  Mild Depresion and Sleep issues: -meds: pristiq, trazodone, prn ativan -feels make her tired, she is weaning off the pristiq -wonder if contribute to fatigue  ROS: See pertinent positives and negatives per HPI.  Past Medical History  Diagnosis Date  . Arthritis   . Depression   . Allergy   . Asthma   . GERD (gastroesophageal reflux disease)   . Hypertension   . Hyperlipidemia   . Fibroids   . Diverticulosis of colon   . Blunt head trauma   . Premature ventricular contractions   . IBS (irritable bowel syndrome)     Past Surgical History  Procedure Laterality Date  . Breast surgery      fibroid tumors  . Dilation and curettage of uterus      Family History  Problem Relation Age of Onset  . Coronary artery disease      fhx  . Heart disease Mother   . Cancer Father     GI ( stomach)    History   Social History  . Marital Status: Married    Spouse Name: N/A    Number of Children: N/A  . Years of Education: N/A   Social History Main Topics  . Smoking status: Former Research scientist (life sciences)  . Smokeless tobacco: None  . Alcohol Use: Yes  . Drug Use:   . Sexual Activity: Yes   Other Topics Concern  . None   Social History Narrative  . None    Current outpatient prescriptions:aspirin 81 MG tablet, Take 81 mg by mouth daily., Disp: , Rfl: ;  budesonide-formoterol (SYMBICORT) 160-4.5 MCG/ACT inhaler, Inhale 2 puffs into the lungs 2 (two) times daily., Disp: ,  Rfl: ;  desvenlafaxine (PRISTIQ) 100 MG 24 hr tablet, TAKE 1 TABLET EVERY DAY, Disp: 90 tablet, Rfl: 3;  levocetirizine (XYZAL) 5 MG tablet, Take 5 mg by mouth every evening., Disp: , Rfl:  losartan-hydrochlorothiazide (HYZAAR) 50-12.5 MG per tablet, TAKE 1 TABLET BY MOUTH DAILY., Disp: 90 tablet, Rfl: 3;  montelukast (SINGULAIR) 10 MG tablet, Take 10 mg by mouth at bedtime., Disp: , Rfl: ;  Probiotic Product (ALIGN PO), Take by mouth daily., Disp: , Rfl: ;  SYNTHROID 137 MCG tablet, TAKE 1 TABLET EVERY DAY, Disp: 90 tablet, Rfl: 3;  traZODone (DESYREL) 50 MG tablet, Take 50 mg by mouth at bedtime., Disp: , Rfl:  [DISCONTINUED] Amlodipine-Valsartan-HCTZ 5-160-12.5 MG TABS, Take 1 tablet by mouth daily., Disp: 90 tablet, Rfl: 3;  [DISCONTINUED] Rivaroxaban 20 MG TABS, Take 20 mg by mouth daily., Disp: 30 tablet, Rfl: 6  EXAM:  Filed Vitals:   08/25/13 1459  BP: 100/70  Pulse: 74  Temp: 97.7 F (36.5 C)    Body mass index is 26.72 kg/(m^2).  GENERAL: vitals reviewed and listed above, alert, oriented, appears well hydrated and in no acute distress  HEENT: atraumatic, conjunttiva clear, no obvious abnormalities on inspection of external nose and ears  NECK: no obvious masses on inspection  LUNGS: clear to auscultation bilaterally, no wheezes, rales  or rhonchi, good air movement  CV: HRRR, no peripheral edema  ABD: BS+, soft, NTTP  MS: moves all extremities without noticeable abnormality  PSYCH: pleasant and cooperative, no obvious depression or anxiety  ASSESSMENT AND PLAN:  Discussed the following assessment and plan:  DEPRESSION  ANXIETY  ASTHMA  Gastroesophageal reflux disease, esophagitis presence not specified  -Patient advised to return or notify a doctor immediately if symptoms worsen or persist or new concerns arise.  Patient Instructions  -stop the dairy for about 2 weeks  -start the probiotic and ensure taking 40 mg of nexium daily  -continue allergy  treatments  -Follow up in 1 month  -wean of the pristiq slowly  -stop the ativan if not using  -try 1/2 dose of the trazadone     Erica Marquez R.

## 2013-09-04 DIAGNOSIS — R109 Unspecified abdominal pain: Secondary | ICD-10-CM | POA: Diagnosis not present

## 2013-10-24 ENCOUNTER — Encounter: Payer: Self-pay | Admitting: Family Medicine

## 2013-10-24 ENCOUNTER — Ambulatory Visit (INDEPENDENT_AMBULATORY_CARE_PROVIDER_SITE_OTHER): Payer: Medicare Other | Admitting: Family Medicine

## 2013-10-24 VITALS — BP 132/88 | HR 78 | Temp 98.2°F | Ht 69.0 in | Wt 180.0 lb

## 2013-10-24 DIAGNOSIS — J4540 Moderate persistent asthma, uncomplicated: Secondary | ICD-10-CM

## 2013-10-24 DIAGNOSIS — F32A Depression, unspecified: Secondary | ICD-10-CM | POA: Insufficient documentation

## 2013-10-24 DIAGNOSIS — F329 Major depressive disorder, single episode, unspecified: Secondary | ICD-10-CM

## 2013-10-24 DIAGNOSIS — I10 Essential (primary) hypertension: Secondary | ICD-10-CM

## 2013-10-24 DIAGNOSIS — J309 Allergic rhinitis, unspecified: Secondary | ICD-10-CM

## 2013-10-24 DIAGNOSIS — R102 Pelvic and perineal pain: Secondary | ICD-10-CM | POA: Insufficient documentation

## 2013-10-24 DIAGNOSIS — Z23 Encounter for immunization: Secondary | ICD-10-CM | POA: Diagnosis not present

## 2013-10-24 DIAGNOSIS — J45909 Unspecified asthma, uncomplicated: Secondary | ICD-10-CM | POA: Insufficient documentation

## 2013-10-24 DIAGNOSIS — K5900 Constipation, unspecified: Secondary | ICD-10-CM

## 2013-10-24 DIAGNOSIS — E039 Hypothyroidism, unspecified: Secondary | ICD-10-CM

## 2013-10-24 DIAGNOSIS — K5909 Other constipation: Secondary | ICD-10-CM

## 2013-10-24 HISTORY — DX: Pelvic and perineal pain: R10.2

## 2013-10-24 MED ORDER — LEVOTHYROXINE SODIUM 137 MCG PO TABS: 137.0000 ug | ORAL_TABLET | Freq: Every day | ORAL | Status: DC

## 2013-10-24 MED ORDER — LOSARTAN POTASSIUM-HCTZ 50-12.5 MG PO TABS
1.5000 | ORAL_TABLET | Freq: Every day | ORAL | Status: DC
Start: 2013-10-24 — End: 2014-06-14

## 2013-10-24 NOTE — Progress Notes (Signed)
Pre visit review using our clinic review tool, if applicable. No additional management support is needed unless otherwise documented below in the visit note. 

## 2013-10-24 NOTE — Progress Notes (Signed)
No chief complaint on file.   HPI:  Erica Marquez is here to establish care.  Last PCP and physical:  Depression: -has cut down on the pristiq to 50mg  - as not depression at this time -trazadone for sleep nightly  Asthma/Allergies: -sees [facility] -takes symbicort and singulair, xyzal  HTN: -losartan-hctz 1.5 tablets daily -she reports she was on several combo medications in the past and does not know why they were stopped-she reports the lisinopril caused a cough -denies: CP, SOB, DOE, swelling  Hypothyroidism: -reports stable on 15mcg of synthroid for many years  Lower abd pain - getting worse/hx GERD: -saw her gyn doctor and had [place], has fibroids but told not the cause of the pain -she has chronic constipation her whole life -pain in lower mid abd, worse at night, constant -they thought it was constipation and did mirilax and colace - bowels have changed - constipation or pencil stools - pain is better after the mirilax -reports has not seen GI for this  -had colonoscopy in 2008 -denies:fevers, vomiting, blood in her stools, melena, weight loss, dysphagia,    Has the following chronic problems and concerns today:  Patient Active Problem List   Diagnosis Date Noted  . Depression 10/24/2013  . Essential hypertension 10/24/2013  . Asthma, chronic 10/24/2013  . Rhinitis, allergic 10/24/2013  . Thyroid activity decreased 10/24/2013  . Chronic constipation 10/24/2013  . Pelvic pain in female, chronic 10/24/2013  . BACK PAIN, LUMBAR, WITH RADICULOPATHY 09/12/2007  . FIBROIDS, UTERUS 04/11/2007  . GERD 07/19/2006  . IRRITABLE BOWEL SYNDROME 07/19/2006    ROS negative for unless reported above: fevers, unintentional weight loss, hearing or vision loss, chest pain, palpitations, struggling to breath, hemoptysis, melena, hematochezia, hematuria, falls, loc, si, thoughts of self harm  Past Medical History  Diagnosis Date  . Arthritis   . Depression    . Allergy   . Asthma   . GERD (gastroesophageal reflux disease)   . Hypertension   . Hyperlipidemia   . Fibroids   . Diverticulosis of colon   . Blunt head trauma   . Premature ventricular contractions   . IBS (irritable bowel syndrome)   . HORDEOLUM, INTERNAL 09/10/2009    Qualifier: Diagnosis of  By: Arnoldo Morale MD, Balinda Quails   . PREMATURE VENTRICULAR CONTRACTIONS 07/19/2006    Qualifier: Diagnosis of  By: Tiney Rouge CMA, Ellison Hughs    . DIVERTICULOSIS, SIGMOID COLON 04/11/2007    Qualifier: Diagnosis of  By: Julaine Hua CMA ([place]), Estill Bamberg    . Irritable bowel syndrome 07/19/2006    Qualifier: Diagnosis of  By: Tiney Rouge CMA, Ellison Hughs    . Edema 08/12/2009    Qualifier: Diagnosis of  By: Arnoldo Morale MD, Balinda Quails     Family History  Problem Relation Age of Onset  . Coronary artery disease      fhx  . Heart disease Mother   . Cancer Father     GI ( stomach)    History   Social History  . Marital Status: Married    Spouse Name: N/A    Number of Children: N/A  . Years of Education: N/A   Social History Main Topics  . Smoking status: Former Research scientist (life sciences)  . Smokeless tobacco: None     Comment: quit remotely  . Alcohol Use: Yes     Comment: very rare and a little wine  . Drug Use: None  . Sexual Activity: Yes   Other Topics Concern  . None   Social  History Narrative   Work or School: retired - used to work in [place] Situation: lives with husband      Spiritual Beliefs: Christian      Lifestyle: walks and goes to the gym - diet is healthy             Current outpatient prescriptions:aspirin 81 MG tablet, Take 81 mg by mouth daily., Disp: , Rfl: ;  budesonide-formoterol (SYMBICORT) 160-4.5 MCG/ACT inhaler, Inhale 2 puffs into the lungs 2 (two) times daily., Disp: , Rfl: ;  desvenlafaxine (PRISTIQ) 100 MG 24 hr tablet, 100 mg. Take 0.5 tablet every day, Disp: , Rfl: ;  Docusate Calcium (STOOL SOFTENER PO), Take by mouth daily., Disp: , Rfl:  levocetirizine (XYZAL) 5 MG  tablet, Take 5 mg by mouth every evening., Disp: , Rfl: ;  levothyroxine (SYNTHROID) 137 MCG tablet, Take 1 tablet (137 mcg total) by mouth daily before breakfast., Disp: 90 tablet, Rfl: 3;  losartan-hydrochlorothiazide (HYZAAR) 50-12.5 MG per tablet, Take 1.5 tablets by mouth daily. Take 1.5 tablet every day, Disp: 135 tablet, Rfl: 3;  montelukast (SINGULAIR) 10 MG tablet, Take 10 mg by mouth at bedtime., Disp: , Rfl:  Multiple Vitamin (MULTIVITAMIN) capsule, Take 1 capsule by mouth daily., Disp: , Rfl: ;  Probiotic Product (ALIGN PO), Take by mouth daily., Disp: , Rfl: ;  traZODone (DESYREL) 50 MG tablet, Take 50 mg by mouth at bedtime., Disp: , Rfl: ;  [DISCONTINUED] Amlodipine-Valsartan-HCTZ 5-160-12.5 MG TABS, Take 1 tablet by mouth daily., Disp: 90 tablet, Rfl: 3;  [DISCONTINUED] Rivaroxaban 20 MG TABS, Take 20 mg by mouth daily., Disp: 30 tablet, Rfl: 6  EXAM:  Filed Vitals:   10/24/13 1116  BP: 132/88  Pulse: 78  Temp: 98.2 F (36.8 C)    Body mass index is 26.57 kg/(m^2).  GENERAL: vitals reviewed and listed above, alert, oriented, appears well hydrated and in no acute distress  HEENT: atraumatic, conjunttiva clear, no obvious abnormalities on inspection of external nose and ears  NECK: no obvious masses on inspection  LUNGS: clear to auscultation bilaterally, no wheezes, rales or rhonchi, good air movement  CV: HRRR, no peripheral edema  ABD: BS+, soft, NTTP  MS: moves all extremities without noticeable abnormality  PSYCH: pleasant and cooperative, no obvious depression or anxiety  ASSESSMENT AND PLAN:  Discussed the following assessment and plan:  Depression -wean of pristiq, continue trazadone  Essential hypertension -cont current treatment, med refilled  Asthma, chronic, moderate persistent, uncomplicated -follow by [facility] asthma  Allergic rhinitis, unspecified allergic rhinitis type -followed by [facility] allergy  Hypothyroidism,  unspecified hypothyroidism type -synthroid refilled  Chronic constipation Pelvic pain in female, chronic -we discussed possible serious and likely etiologies, workup and treatment, treatment risks and return precautions -after this discussion, Rabecka opted for daily fiber supplement, mirilax, GI evaluation if persists -of course, we advised Natasa  to return or notify a doctor immediately if symptoms worsen or persist or new concerns arise.  -We reviewed the PMH, PSH, FH, SH, Meds and Allergies. -We provided refills for any medications we will prescribe as needed. -We addressed current concerns per orders and patient instructions. -We have asked for records for pertinent exams, studies, vaccines and notes from previous providers. -We have advised patient to follow up per instructions below. -prevnar 13 and flu vaccines given today   -Patient advised to return or notify a doctor immediately if symptoms worsen or persist or new concerns arise.  Patient Instructions  Go to every other day on the pristiq for a week or two and then stop this medication  Please take a fiber supplement every morning (metameucil or citracel) Whenever you star to get constipated start the mirilax until having at least 1 soft stool per day If pain and constipation do not resolve let us know and we will send the referral to the gastroenterologist  Please check on the cost of the shingles vaccine  Please schedule you welcome to medicare wellness visit in 3-4 months        Ledia Hanford R.

## 2013-10-24 NOTE — Patient Instructions (Addendum)
Go to every other day on the pristiq for a week or two and then stop this medication  Please take a fiber supplement every morning (metameucil or citracel) Whenever you star to get constipated start the mirilax until having at least 1 soft stool per day If pain and constipation do not resolve let us know and we will send the referral to the gastroenterologist  Please check on the cost of the shingles vaccine  Please schedule you welcome to medicare wellness visit in 3-4 months

## 2013-10-24 NOTE — Addendum Note (Signed)
Addended by: Agnes Lawrence on: 10/24/2013 12:08 PM   Modules accepted: Orders

## 2013-10-25 ENCOUNTER — Telehealth: Payer: Self-pay | Admitting: Family Medicine

## 2013-10-25 NOTE — Telephone Encounter (Signed)
emmi emailed °

## 2013-12-08 DIAGNOSIS — J301 Allergic rhinitis due to pollen: Secondary | ICD-10-CM | POA: Diagnosis not present

## 2013-12-08 DIAGNOSIS — J3089 Other allergic rhinitis: Secondary | ICD-10-CM | POA: Diagnosis not present

## 2013-12-08 DIAGNOSIS — H1045 Other chronic allergic conjunctivitis: Secondary | ICD-10-CM | POA: Diagnosis not present

## 2013-12-08 DIAGNOSIS — J4530 Mild persistent asthma, uncomplicated: Secondary | ICD-10-CM | POA: Diagnosis not present

## 2014-01-15 ENCOUNTER — Other Ambulatory Visit: Payer: Self-pay | Admitting: Obstetrics & Gynecology

## 2014-01-15 DIAGNOSIS — R109 Unspecified abdominal pain: Secondary | ICD-10-CM | POA: Diagnosis not present

## 2014-01-15 DIAGNOSIS — R1084 Generalized abdominal pain: Secondary | ICD-10-CM

## 2014-01-16 ENCOUNTER — Encounter: Payer: Self-pay | Admitting: Family Medicine

## 2014-01-16 ENCOUNTER — Other Ambulatory Visit: Payer: Self-pay | Admitting: *Deleted

## 2014-01-16 ENCOUNTER — Other Ambulatory Visit: Payer: Medicare Other

## 2014-01-16 DIAGNOSIS — E039 Hypothyroidism, unspecified: Secondary | ICD-10-CM

## 2014-01-16 MED ORDER — LEVOTHYROXINE SODIUM 137 MCG PO TABS: 137.0000 ug | ORAL_TABLET | Freq: Every day | ORAL | Status: DC

## 2014-01-17 ENCOUNTER — Telehealth: Payer: Self-pay | Admitting: *Deleted

## 2014-01-17 ENCOUNTER — Other Ambulatory Visit: Payer: Self-pay | Admitting: *Deleted

## 2014-01-17 ENCOUNTER — Other Ambulatory Visit (INDEPENDENT_AMBULATORY_CARE_PROVIDER_SITE_OTHER): Payer: Medicare Other

## 2014-01-17 DIAGNOSIS — E785 Hyperlipidemia, unspecified: Secondary | ICD-10-CM

## 2014-01-17 DIAGNOSIS — I10 Essential (primary) hypertension: Secondary | ICD-10-CM

## 2014-01-17 DIAGNOSIS — E038 Other specified hypothyroidism: Secondary | ICD-10-CM | POA: Diagnosis not present

## 2014-01-17 LAB — BASIC METABOLIC PANEL
BUN: 14 mg/dL (ref 6–23)
CALCIUM: 9.6 mg/dL (ref 8.4–10.5)
CO2: 29 meq/L (ref 19–32)
CREATININE: 0.7 mg/dL (ref 0.4–1.2)
Chloride: 103 mEq/L (ref 96–112)
GFR: 89.13 mL/min (ref >60.00)
GLUCOSE: 92 mg/dL (ref 70–99)
POTASSIUM: 3.6 meq/L (ref 3.5–5.1)
Sodium: 139 mEq/L (ref 135–145)

## 2014-01-17 LAB — TSH: TSH: 1.42 u[IU]/mL (ref 0.35–4.50)

## 2014-01-17 LAB — LIPID PANEL
CHOL/HDL RATIO: 3
Cholesterol: 225 mg/dL — ABNORMAL HIGH (ref 0–200)
HDL: 65.4 mg/dL (ref >39.00)
LDL Cholesterol: 139 mg/dL — ABNORMAL HIGH (ref 0–99)
NONHDL: 159.6
Triglycerides: 101 mg/dL (ref 0.0–149.0)
VLDL: 20.2 mg/dL (ref 0.0–40.0)

## 2014-01-17 NOTE — Telephone Encounter (Signed)
Patient came in today for labs prior to her appt with Dr Maudie Mercury and the pt presented with a written note from [facility] requesting a BUN/Creatnine be faxed to [facility] at [phone].

## 2014-01-22 ENCOUNTER — Ambulatory Visit
Admission: RE | Admit: 2014-01-22 | Discharge: 2014-01-22 | Disposition: A | Discharge status: Home / Self Care | Payer: Medicare Other | Source: Ambulatory Visit | Attending: Obstetrics & Gynecology | Admitting: Obstetrics & Gynecology

## 2014-01-22 DIAGNOSIS — K7689 Other specified diseases of liver: Secondary | ICD-10-CM | POA: Diagnosis not present

## 2014-01-22 DIAGNOSIS — R1084 Generalized abdominal pain: Secondary | ICD-10-CM

## 2014-01-22 DIAGNOSIS — K209 Esophagitis, unspecified: Secondary | ICD-10-CM | POA: Diagnosis not present

## 2014-01-22 DIAGNOSIS — D259 Leiomyoma of uterus, unspecified: Secondary | ICD-10-CM | POA: Diagnosis not present

## 2014-01-22 MED ORDER — IOHEXOL 300 MG/ML  SOLN
100.0000 mL | Freq: Once | INTRAMUSCULAR | Status: AC | PRN
  Administered 2014-01-22: 100 mL via INTRAVENOUS

## 2014-01-23 ENCOUNTER — Encounter: Payer: Self-pay | Admitting: Gastroenterology

## 2014-01-25 ENCOUNTER — Encounter: Payer: Self-pay | Admitting: Family Medicine

## 2014-01-25 ENCOUNTER — Ambulatory Visit (INDEPENDENT_AMBULATORY_CARE_PROVIDER_SITE_OTHER): Payer: Medicare Other | Admitting: Family Medicine

## 2014-01-25 VITALS — BP 118/60 | HR 96 | Temp 98.2°F | Ht 69.0 in | Wt 179.0 lb

## 2014-01-25 DIAGNOSIS — K5909 Other constipation: Secondary | ICD-10-CM

## 2014-01-25 DIAGNOSIS — Z0000 Encounter for general adult medical examination without abnormal findings: Secondary | ICD-10-CM | POA: Diagnosis not present

## 2014-01-25 DIAGNOSIS — J4540 Moderate persistent asthma, uncomplicated: Secondary | ICD-10-CM | POA: Diagnosis not present

## 2014-01-25 DIAGNOSIS — Z23 Encounter for immunization: Secondary | ICD-10-CM

## 2014-01-25 DIAGNOSIS — I10 Essential (primary) hypertension: Secondary | ICD-10-CM | POA: Diagnosis not present

## 2014-01-25 DIAGNOSIS — K5900 Constipation, unspecified: Secondary | ICD-10-CM

## 2014-01-25 DIAGNOSIS — F32A Depression, unspecified: Secondary | ICD-10-CM

## 2014-01-25 DIAGNOSIS — F329 Major depressive disorder, single episode, unspecified: Secondary | ICD-10-CM

## 2014-01-25 NOTE — Progress Notes (Signed)
Medicare Annual Preventive Care Visit  (initial annual wellness or annual wellness exam)  Concerns and/or follow up today:  Depression/Insomnia: -she decided to wean off the pristiq 01/2013 - but did not do well off of it and depression recurred so she restarted this -trazadone for sleep nightly but this makes her tired -reports: she wants to be on as little as possible -denies: apniec spells, daytime somnelence  Asthma/Allergies: -sees [facility] -takes symbicort and singulair, xyzal  HTN: -losartan-hctz 1.5 tablets daily -reports lisinopril caused a cough in the past -denies: CP, SOB, DOE, swelling  Hypothyroidism: -reports stable on 176mcg of synthroid for many years -denies: hot/cold intol, palpitations  Lower abd pain/Constipation/hx GERD: -saw her gyn doctor and had [place], has fibroids but told not the cause of the pain -she has chronic constipation her whole life -reported gyn thought it was constipation and did mirilax and colace - pain is better after the mirilax -reports has not seen GI for this  -had colonoscopy in 2008 -10/2014 visit advised fiber supplement and mirilax and cal back if pain persisted with this tx for GI referral -reports today: she did not improve and her gyn doc did a ct and now has referred her to GI -denies:fevers, vomiting, blood in her stools, melena, weight loss, dysphagia,    ROS: negative for report of fevers, unintentional weight loss, vision changes, vision loss, hearing loss or change, chest pain, sob, hemoptysis, melena, hematochezia, hematuria, genital discharge or lesions, falls, bleeding or bruising, loc, thoughts of suicide or self harm, memory loss  1.) Patient-completed health risk assessment  - completed and reviewed, see scanned documentation  2.) Review of Medical History: -PMH, PSH, Family History and current specialty and care providers reviewed and updated and listed below  - see scanned in document in chart  and below  Past Medical History  Diagnosis Date  . Arthritis   . Depression   . Allergy   . Asthma   . GERD (gastroesophageal reflux disease)   . Hypertension   . Hyperlipidemia   . Fibroids   . Diverticulosis of colon   . Blunt head trauma   . Premature ventricular contractions   . IBS (irritable bowel syndrome)   . HORDEOLUM, INTERNAL 09/10/2009    Qualifier: Diagnosis of  By: Arnoldo Morale MD, Balinda Quails   . PREMATURE VENTRICULAR CONTRACTIONS 07/19/2006    Qualifier: Diagnosis of  By: Tiney Rouge CMA, Ellison Hughs    . DIVERTICULOSIS, SIGMOID COLON 04/11/2007    Qualifier: Diagnosis of  By: Julaine Hua CMA ([place]), Estill Bamberg    . Irritable bowel syndrome 07/19/2006    Qualifier: Diagnosis of  By: Tiney Rouge CMA, Ellison Hughs    . Edema 08/12/2009    Qualifier: Diagnosis of  By: Arnoldo Morale MD, Balinda Quails     Past Surgical History  Procedure Laterality Date  . Breast surgery      fibroid tumors  . Dilation and curettage of uterus      History   Social History  . Marital Status: Married    Spouse Name: N/A    Number of Children: N/A  . Years of Education: N/A   Occupational History  . Not on file.   Social History Main Topics  . Smoking status: Former Research scientist (life sciences)  . Smokeless tobacco: Not on file     Comment: quit remotely  . Alcohol Use: Yes     Comment: very rare and a little wine  . Drug Use: Not on file  . Sexual Activity: Yes  Other Topics Concern  . Not on file   Social History Narrative   Work or School: retired - used to work in [place] Situation: lives with husband      Spiritual Beliefs: Christian      Lifestyle: walks and goes to the gym - diet is healthy             The patient has a family history of  3.) Review of functional ability and level of safety:  Any difficulty hearing?  NO  History of falling? NO  Any trouble with IADLs - using a phone, using transportation, grocery shopping, preparing meals, doing housework, doing laundry, taking medications  and managing money? NO  Advance Directives? YES  See summary of recommendations in Patient Instructions below.  4.) Physical Exam Filed Vitals:   01/25/14 1613  BP: 118/60  Pulse: 96  Temp: 98.2 F (36.8 C)   Estimated body mass index is 26.42 kg/(m^2) as calculated from the following:   Height as of this encounter: 5\' 9"  (1.753 m).   Weight as of this encounter: 179 lb (81.194 kg).  EKG (optional): deferred  General: alert, appear well hydrated and in no acute distress  HEENT: visual acuity grossly intact  CV: HRRR  Lungs: CTA bilaterally  Psych: pleasant and cooperative, no obvious depression or anxiety  Mini Cog: Patient Score: NEG    See patient instructions for recommendations.  Education and counseling regarding the above review of health provided with a plan for the following: -see scanned patient completed form for further details -fall prevention strategies discussed  -healthy lifestyle discussed -importance and resources for completing advanced directives discussed -see patient instructions below for any other recommendations provided  4)The following written screening schedule of preventive measures were reviewed with assessment and plan made per below, orders and patient instructions:      AAA screening n/a     Alcohol screening: done     Obesity Screening and counseling: done     STI screening: no     Tobacco Screening: done       Pneumococcal (PPSV23 -one dose after 64, one before if risk factors), influenza yearly and hepatitis B vaccines (if high risk - end stage renal disease, IV drugs, homosexual men, live in home for mentally retarded, hemophilia receiving factors) ASSESSMENT/PLAN: prevnar done, will get pneumococcal next year      Screening mammograph (yearly if >40) ASSESSMENT/PLAN:  done      Screening Pap smear/pelvic exam (q2 years) ASSESSMENT/PLAN: done      Prostate cancer screening ASSESSMENT/PLAN: n/a      Colorectal cancer  screening (FOBT yearly or flex sig q4y or colonoscopy q10y or barium enema q4y) ASSESSMENT/PLAN: done, seeing GI in 1 month      Diabetes outpatient self-management training services ASSESSMENT/PLAN: done      Bone mass measurements(covered q2y if indicated - estrogen def, osteoporosis, hyperparathyroid, vertebral abnormalities, osteoporosis or steroids) ASSESSMENT/PLAN: done with gyn      Screening for glaucoma(q1y if high risk - diabetes, FH, AA and > 15 or hispanic and > 65) ASSESSMENT/PLAN: see opthomologist      Medical nutritional therapy for individuals with diabetes or renal disease ASSESSMENT/PLAN: n/a      Cardiovascular screening blood tests (lipids q5y) ASSESSMENT/PLAN: done      Diabetes screening tests ASSESSMENT/PLAN: done   7.) Summary: -risk factors and conditions per above assessment were discussed and treatment, recommendations and referrals were offered per  documentation above and orders and patient instructions.  Medicare annual wellness visit, initial  Essential hypertension  Depression  Asthma, chronic, moderate persistent, uncomplicated  Chronic constipation  Patient Instructions  BEFORE YOU LEAVE: -Tdap -schedule a follow up appointment in 2 month  We recommend the following healthy lifestyle measures: - eat a healthy diet consisting of lots of vegetables, fruits, beans, nuts, seeds, healthy meats such as white chicken and fish and whole grains.  - avoid fried foods, fast food, processed foods, sodas, red meet and other fattening foods.  - get a least 150 minutes of aerobic exercise per week.   Decrease the Pristiq to 50mg  daily  In two weeks try halving trazadone for a few weeks, then every other day, then stop

## 2014-01-25 NOTE — Patient Instructions (Addendum)
BEFORE YOU LEAVE: -Tdap -schedule a follow up appointment in 2 month  We recommend the following healthy lifestyle measures: - eat a healthy diet consisting of lots of vegetables, fruits, beans, nuts, seeds, healthy meats such as white chicken and fish and whole grains.  - avoid fried foods, fast food, processed foods, sodas, red meet and other fattening foods.  - get a least 150 minutes of aerobic exercise per week.   Decrease the Pristiq to 50mg  daily  In two weeks try halving trazadone for a few weeks, then every other day, then stop

## 2014-01-25 NOTE — Progress Notes (Signed)
Pre visit review using our clinic review tool, if applicable. No additional management support is needed unless otherwise documented below in the visit note. 

## 2014-01-26 NOTE — Addendum Note (Signed)
Addended by: Agnes Lawrence on: 01/26/2014 08:21 AM   Modules accepted: Orders

## 2014-03-07 ENCOUNTER — Encounter: Payer: Self-pay | Admitting: Gastroenterology

## 2014-03-07 ENCOUNTER — Ambulatory Visit (INDEPENDENT_AMBULATORY_CARE_PROVIDER_SITE_OTHER): Payer: Medicare Other | Admitting: Gastroenterology

## 2014-03-07 VITALS — BP 150/74 | HR 72 | Ht 69.0 in | Wt 180.5 lb

## 2014-03-07 DIAGNOSIS — R194 Change in bowel habit: Secondary | ICD-10-CM | POA: Diagnosis not present

## 2014-03-07 DIAGNOSIS — K5730 Diverticulosis of large intestine without perforation or abscess without bleeding: Secondary | ICD-10-CM | POA: Diagnosis not present

## 2014-03-07 DIAGNOSIS — K5900 Constipation, unspecified: Secondary | ICD-10-CM

## 2014-03-07 DIAGNOSIS — R1030 Lower abdominal pain, unspecified: Secondary | ICD-10-CM

## 2014-03-07 DIAGNOSIS — R102 Pelvic and perineal pain: Secondary | ICD-10-CM | POA: Diagnosis not present

## 2014-03-07 DIAGNOSIS — R935 Abnormal findings on diagnostic imaging of other abdominal regions, including retroperitoneum: Secondary | ICD-10-CM

## 2014-03-07 MED ORDER — PEG-KCL-NACL-NASULF-NA ASC-C 100 G PO SOLR: 1.0000 | Freq: Once | ORAL | Status: DC

## 2014-03-07 NOTE — Progress Notes (Addendum)
History of Present Illness: This is a 64 year old female referred by Dr. Colin Benton for evaluation of lower abdominal pain and pelvic pain. She describes pelvic area, lower abdominal area pressure and discomfort sensation that occurs randomly and lasts for about an hour at a time. There is no relationship to bowel movements meals or movement.  She has a history of chronic constipation with occasional urgent formed stools. She has noted recent changes in the shape and caliber of her stool however the stool caliber has now returned to normal. She states she tried MiraLAX on a daily basis however this led to loose stools and she discontinued it. She previously underwent colonoscopy for routine screening in 01/2006 showing diverticulosis. Upper endoscopy performed in 09/2006 for chest pain was normal. Has a history of reflux but no active reflux symptoms and she is not taking medications for GERD. Denies weight loss, diarrhea, melena, hematochezia, nausea, vomiting, dysphagia, reflux symptoms, chest pain.   Abd/pelvic CT IMPRESSION: 1. Thickening of the lower esophagus, most commonly seen with esophagitis. Consider further evaluation with endoscopy as needed. 2. Numerous hepatic cysts. 3. Normal appendix. 4. Significant stool burden. 5. Uterine fibroids.  Pelvic US IMPRESSION: 1. Fibroid uterus. 2. Decrease in volume of the fibroid compared with previous exam.  No Known Allergies Outpatient Prescriptions Prior to Visit  Medication Sig Dispense Refill  . budesonide-formoterol (SYMBICORT) 160-4.5 MCG/ACT inhaler Inhale 2 puffs into the lungs 2 (two) times daily.    Marland Kitchen desvenlafaxine (PRISTIQ) 100 MG 24 hr tablet 100 mg. Take 0.5 tablet every day    . levocetirizine (XYZAL) 5 MG tablet Take 5 mg by mouth every evening.    Marland Kitchen levothyroxine (SYNTHROID) 137 MCG tablet Take 1 tablet (137 mcg total) by mouth daily before breakfast. 30 tablet 0  . losartan-hydrochlorothiazide (HYZAAR) 50-12.5 MG per tablet  Take 1.5 tablets by mouth daily. Take 1.5 tablet every day 135 tablet 3  . montelukast (SINGULAIR) 10 MG tablet Take 10 mg by mouth at bedtime.    . Multiple Vitamin (MULTIVITAMIN) capsule Take 1 capsule by mouth daily.    . Probiotic Product (ALIGN PO) Take by mouth daily.    . traZODone (DESYREL) 50 MG tablet Take 50 mg by mouth as needed.      No facility-administered medications prior to visit.   Past Medical History  Diagnosis Date  . Arthritis   . Depression   . Allergy   . Asthma   . GERD (gastroesophageal reflux disease)   . Hypertension   . Hyperlipidemia   . Fibroids   . Diverticulosis of colon   . Blunt head trauma   . Premature ventricular contractions   . IBS (irritable bowel syndrome)   . HORDEOLUM, INTERNAL 09/10/2009    Qualifier: Diagnosis of  By: Arnoldo Morale MD, Balinda Quails   . PREMATURE VENTRICULAR CONTRACTIONS 07/19/2006    Qualifier: Diagnosis of  By: Tiney Rouge CMA, Ellison Hughs    . DIVERTICULOSIS, SIGMOID COLON 04/11/2007    Qualifier: Diagnosis of  By: Julaine Hua CMA ([place]), Estill Bamberg    . Irritable bowel syndrome 07/19/2006    Qualifier: Diagnosis of  By: Tiney Rouge CMA, Ellison Hughs    . Edema 08/12/2009    Qualifier: Diagnosis of  By: Arnoldo Morale MD, Balinda Quails   . Cardiac arrhythmia due to congenital heart disease     Occassionally   Past Surgical History  Procedure Laterality Date  . Breast surgery      fibroid tumors  . Dilation and curettage of uterus  History   Social History  . Marital Status: Married    Spouse Name: N/A  . Number of Children: 2  . Years of Education: N/A   Occupational History  . Housewife    Social History Main Topics  . Smoking status: Former Smoker -- 0.50 packs/day for 20 years    Types: Cigarettes    Quit date: 01/20/1988  . Smokeless tobacco: Never Used     Comment: quit remotely  . Alcohol Use: 0.0 oz/week    0 Standard drinks or equivalent per week     Comment: very rare and a little wine  . Drug Use: No  . Sexual Activity: Yes   Other  Topics Concern  . None   Social History Narrative   Work or School: retired - used to work in [place] Situation: lives with husband      Spiritual Beliefs: Christian      Lifestyle: walks and goes to the gym - diet is healthy            Family History  Problem Relation Age of Onset  . Coronary artery disease Mother   . Heart disease Mother   . Stomach cancer Father   . Colon cancer Neg Hx   . Colon polyps Mother   . Diabetes Mother   . Kidney disease Neg Hx   . Esophageal cancer Neg Hx   . Gallbladder disease Neg Hx      Review of Systems: Pertinent positive and negative review of systems were noted in the above HPI section. All other review of systems were otherwise negative.   Physical Exam: General: Well developed, well nourished, no acute distress Head: Normocephalic and atraumatic Eyes:  sclerae anicteric, EOMI Ears: Normal auditory acuity Mouth: No deformity or lesions Neck: Supple, no masses or thyromegaly Lungs: Clear throughout to auscultation Heart: Regular rate and rhythm; no murmurs, rubs or bruits Abdomen: Soft, mild right lower quadrant tenderness to deep palpation and non distended. No masses, hepatosplenomegaly or hernias noted. Normal Bowel sounds Rectal: Deferred to colonoscopy Musculoskeletal: Symmetrical with no gross deformities  Skin: No lesions on visible extremities Pulses:  Normal pulses noted Extremities: No clubbing, cyanosis, edema or deformities noted Neurological: Alert oriented x 4, grossly nonfocal Cervical Nodes:  No significant cervical adenopathy Inguinal Nodes: No significant inguinal adenopathy Psychological:  Alert and cooperative. Normal mood and affect  Assessment and Recommendations:  1. Lower abdominal pain, pelvic pain. Not clear this is GI related. There is no association of her symptoms with any digestive function. Although she has a history of irritable bowel syndrome her current symptoms are  certainly atypical for IBS. She has uterine fibroids which could be causing symptoms. Follow-up with her PCP and GYN.  2. Constipation, change in bowel habits. Increase dietary fiber and water intake. Consider a trial of Linzess. Rule out colorectal neoplasms and other colonic disorders. The risks (including bleeding, perforation, infection, missed lesions, medication reactions and possible hospitalization or surgery if complications occur), benefits, and alternatives to colonoscopy with possible biopsy and possible polypectomy were discussed with the patient and they consent to proceed.   3. Thickened esophagus on CT. Likely esophagitis related to GERD. Currently she has no symptoms. Rule out neoplasm and other disorders. The risks (including bleeding, perforation, infection, missed lesions, medication reactions and possible hospitalization or surgery if complications occur), benefits, and alternatives to endoscopy with possible biopsy and possible dilation were discussed with the patient and they consent  to proceed.    cc: [name], Do [address] [place], [place] [address]

## 2014-03-07 NOTE — Patient Instructions (Signed)
You have been scheduled for an endoscopy and colonoscopy. Please follow the written instructions given to you at your visit today. Please pick up your prep at the pharmacy within the next 1-3 days. If you use inhalers (even only as needed), please bring them with you on the day of your procedure. Your physician has requested that you go to www.startemmi.com and enter the access code given to you at your visit today. This web site gives a general overview about your procedure. However, you should still follow specific instructions given to you by our office regarding your preparation for the procedure.  High-Fiber Diet Fiber is found in fruits, vegetables, and grains. A high-fiber diet encourages the addition of more whole grains, legumes, fruits, and vegetables in your diet. The recommended amount of fiber for adult males is 38 g per day. For adult females, it is 25 g per day. Pregnant and lactating women should get 28 g of fiber per day. If you have a digestive or bowel problem, ask your caregiver for advice before adding high-fiber foods to your diet. Eat a variety of high-fiber foods instead of only a select few type of foods.  PURPOSE  To increase stool bulk.  To make bowel movements more regular to prevent constipation.  To lower cholesterol.  To prevent overeating. WHEN IS THIS DIET USED?  It may be used if you have constipation and hemorrhoids.  It may be used if you have uncomplicated diverticulosis (intestine condition) and irritable bowel syndrome.  It may be used if you need help with weight management.  It may be used if you want to add it to your diet as a protective measure against atherosclerosis, diabetes, and cancer. SOURCES OF FIBER  Whole-grain breads and cereals.  Fruits, such as apples, oranges, bananas, berries, prunes, and pears.  Vegetables, such as green peas, carrots, sweet potatoes, beets, broccoli, cabbage, spinach, and artichokes.  Legumes, such split  peas, soy, lentils.  Almonds. FIBER CONTENT IN FOODS Starches and Grains / Dietary Fiber (g)  Cheerios, 1 cup / 3 g  Corn Flakes cereal, 1 cup / 0.7 g  Rice crispy treat cereal, 1 cup / 0.3 g  Instant oatmeal (cooked),  cup / 2 g  Frosted wheat cereal, 1 cup / 5.1 g  Brown, long-grain rice (cooked), 1 cup / 3.5 g  White, long-grain rice (cooked), 1 cup / 0.6 g  Enriched macaroni (cooked), 1 cup / 2.5 g Legumes / Dietary Fiber (g)  Baked beans (canned, plain, or vegetarian),  cup / 5.2 g  Kidney beans (canned),  cup / 6.8 g  Pinto beans (cooked),  cup / 5.5 g Breads and Crackers / Dietary Fiber (g)  Plain or honey graham crackers, 2 squares / 0.7 g  Saltine crackers, 3 squares / 0.3 g  Plain, salted pretzels, 10 pieces / 1.8 g  Whole-wheat bread, 1 slice / 1.9 g  White bread, 1 slice / 0.7 g  Raisin bread, 1 slice / 1.2 g  Plain bagel, 3 oz / 2 g  Flour tortilla, 1 oz / 0.9 g  Corn tortilla, 1 small / 1.5 g  Hamburger or hotdog bun, 1 small / 0.9 g Fruits / Dietary Fiber (g)  Apple with skin, 1 medium / 4.4 g  Sweetened applesauce,  cup / 1.5 g  Banana,  medium / 1.5 g  Grapes, 10 grapes / 0.4 g  Orange, 1 small / 2.3 g  Raisin, 1.5 oz / 1.6 g  Melon,  1 cup / 1.4 g Vegetables / Dietary Fiber (g)  Green beans (canned),  cup / 1.3 g  Carrots (cooked),  cup / 2.3 g  Broccoli (cooked),  cup / 2.8 g  Peas (cooked),  cup / 4.4 g  Mashed potatoes,  cup / 1.6 g  Lettuce, 1 cup / 0.5 g  Corn (canned),  cup / 1.6 g  Tomato,  cup / 1.1 g Document Released: 01/05/2005 Document Revised: 07/07/2011 Document Reviewed: 04/09/2011 ExitCare Patient Information 2015 [place], [place]. This information is not intended to replace advice given to you by your health care provider. Make sure you discuss any questions you have with your health care provider.  Thank you for choosing me and [facility].  Pricilla Riffle. Dagoberto Ligas., MD.,  Marval Regal  cc: Colin Benton, DO

## 2014-03-20 DIAGNOSIS — D126 Benign neoplasm of colon, unspecified: Secondary | ICD-10-CM | POA: Insufficient documentation

## 2014-03-20 HISTORY — DX: Benign neoplasm of colon, unspecified: D12.6

## 2014-03-21 DIAGNOSIS — H1045 Other chronic allergic conjunctivitis: Secondary | ICD-10-CM | POA: Diagnosis not present

## 2014-03-21 DIAGNOSIS — J209 Acute bronchitis, unspecified: Secondary | ICD-10-CM | POA: Diagnosis not present

## 2014-03-21 DIAGNOSIS — J301 Allergic rhinitis due to pollen: Secondary | ICD-10-CM | POA: Diagnosis not present

## 2014-03-21 DIAGNOSIS — J4530 Mild persistent asthma, uncomplicated: Secondary | ICD-10-CM | POA: Diagnosis not present

## 2014-03-21 DIAGNOSIS — J019 Acute sinusitis, unspecified: Secondary | ICD-10-CM | POA: Diagnosis not present

## 2014-03-21 DIAGNOSIS — J3089 Other allergic rhinitis: Secondary | ICD-10-CM | POA: Diagnosis not present

## 2014-03-22 ENCOUNTER — Encounter: Payer: Self-pay | Admitting: Gastroenterology

## 2014-03-22 ENCOUNTER — Ambulatory Visit (AMBULATORY_SURGERY_CENTER): Payer: Medicare Other | Admitting: Gastroenterology

## 2014-03-22 VITALS — BP 149/74 | HR 71 | Temp 98.3°F | Resp 24 | Ht 69.0 in | Wt 180.0 lb

## 2014-03-22 DIAGNOSIS — R1032 Left lower quadrant pain: Secondary | ICD-10-CM

## 2014-03-22 DIAGNOSIS — M545 Low back pain: Secondary | ICD-10-CM | POA: Diagnosis not present

## 2014-03-22 DIAGNOSIS — I499 Cardiac arrhythmia, unspecified: Secondary | ICD-10-CM | POA: Diagnosis not present

## 2014-03-22 DIAGNOSIS — I10 Essential (primary) hypertension: Secondary | ICD-10-CM | POA: Diagnosis not present

## 2014-03-22 DIAGNOSIS — K5900 Constipation, unspecified: Secondary | ICD-10-CM

## 2014-03-22 DIAGNOSIS — R933 Abnormal findings on diagnostic imaging of other parts of digestive tract: Secondary | ICD-10-CM

## 2014-03-22 DIAGNOSIS — R194 Change in bowel habit: Secondary | ICD-10-CM

## 2014-03-22 DIAGNOSIS — R1031 Right lower quadrant pain: Secondary | ICD-10-CM | POA: Diagnosis not present

## 2014-03-22 DIAGNOSIS — F329 Major depressive disorder, single episode, unspecified: Secondary | ICD-10-CM | POA: Diagnosis not present

## 2014-03-22 DIAGNOSIS — D122 Benign neoplasm of ascending colon: Secondary | ICD-10-CM

## 2014-03-22 DIAGNOSIS — K5909 Other constipation: Secondary | ICD-10-CM

## 2014-03-22 DIAGNOSIS — K2950 Unspecified chronic gastritis without bleeding: Secondary | ICD-10-CM | POA: Diagnosis not present

## 2014-03-22 DIAGNOSIS — J45909 Unspecified asthma, uncomplicated: Secondary | ICD-10-CM | POA: Diagnosis not present

## 2014-03-22 MED ORDER — LINACLOTIDE 145 MCG PO CAPS: 145.0000 ug | ORAL_CAPSULE | Freq: Every day | ORAL | Status: DC

## 2014-03-22 MED ORDER — SODIUM CHLORIDE 0.9 % IV SOLN: 500.0000 mL | INTRAVENOUS | Status: DC

## 2014-03-22 MED ORDER — OMEPRAZOLE 20 MG PO CPDR: 20.0000 mg | DELAYED_RELEASE_CAPSULE | Freq: Every day | ORAL | Status: DC

## 2014-03-22 NOTE — Progress Notes (Signed)
Called to room to assist during endoscopic procedure.  Patient ID and intended procedure confirmed with present staff. Received instructions for my participation in the procedure from the performing physician.  

## 2014-03-22 NOTE — Progress Notes (Signed)
Report to PACU, RN, vss, BBS= Clear.  

## 2014-03-22 NOTE — Op Note (Signed)
[place]  [address]. [place], [address]   ENDOSCOPY PROCEDURE REPORT  PATIENT: Erica Marquez, Erica Marquez  MR#: [id number] BIRTHDATE: [birth year] , 86  yrs. old GENDER: female ENDOSCOPIST: Ladene Artist, MD, [facility] REFERRED BY:  Colin Benton PROCEDURE DATE:  03/22/2014 PROCEDURE:  EGD w/ biopsy ASA CLASS:     Class II INDICATIONS:  abnormal CT of the GI tract, abdominal pain in the lower left quadrant, and abdominal pain in the lower right quadrant. MEDICATIONS: Monitored anesthesia care, Per Anesthesia , and Propofol 150 mg IV TOPICAL ANESTHETIC: none DESCRIPTION OF PROCEDURE: After the risks benefits and alternatives of the procedure were thoroughly explained, informed consent was obtained.  The LB [id number] endoscope was introduced through the mouth and advanced to the second portion of the duodenum , Without limitations.  The instrument was slowly withdrawn as the mucosa was fully examined.    ESOPHAGUS: A mild Schatzki ring was found at the gastroesophageal junction.   The mucosa of the esophagus appeared normal. STOMACH: Mild patchy gastritis was found in the gastric body. Multiple biopsies were performed.   The stomach otherwise appeared normal. DUODENUM: The duodenal mucosa showed no abnormalities in the bulb and 2nd part of the duodenum.  Retroflexed views revealed a small hiatal hernia.  The scope was then withdrawn from the patient and the procedure completed.  COMPLICATIONS: There were no immediate complications.  ENDOSCOPIC IMPRESSION: 1.   Schatzki ring at the gastroesophageal junction 2.   Small hiatal hernia 3.   Mild gastritis in the gastric body; multiple biopsies performed   RECOMMENDATIONS: 1.  Anti-reflux regimen 2.  PPI qam: omeprazole 20 mg po daily, 1 year supply 3.  Await pathology results  eSigned:  Ladene Artist, MD, [facility] 03/22/2014 3:09 PM

## 2014-03-22 NOTE — Op Note (Signed)
[place]  [address]. [place], [address]   COLONOSCOPY PROCEDURE REPORT  PATIENT: Erica, Marquez  MR#: [id number] BIRTHDATE: 08/18/1950 , 51  yrs. old GENDER: female ENDOSCOPIST: Ladene Artist, MD, [facility] REFERRED BY:Kim, Hannah PROCEDURE DATE:  03/22/2014 PROCEDURE:   Colonoscopy with biopsy First Screening Colonoscopy - Avg.  risk and is 50 yrs.  old or older - No.  Prior Negative Screening - Now for repeat screening. N/A  History of Adenoma - Now for follow-up colonoscopy & has been > or = to 3 yrs.  N/A  Polyps Removed Today? Yes. ASA CLASS:   Class II INDICATIONS:change in bowel habits, constipation, abdominal pain in the lower right quadrant, and in the lower left quadrant. MEDICATIONS: Monitored anesthesia care and Propofol 250 mg IV DESCRIPTION OF PROCEDURE:   After the risks benefits and alternatives of the procedure were thoroughly explained, informed consent was obtained.  The digital rectal exam revealed no abnormalities of the rectum.   The LB [id number]  endoscope was introduced through the anus and advanced to the cecum, which was identified by both the appendix and ileocecal valve. No adverse events experienced.   The quality of the prep was good, using MoviPrep  The instrument was then slowly withdrawn as the colon was fully examined.   COLON FINDINGS: A sessile polyp measuring 4 mm in size was found in the ascending colon.  A polypectomy was performed with cold forceps.  The resection was complete, the polyp tissue was completely retrieved and sent to histology.   There was mild diverticulosis noted in the descending colon.   The examination was otherwise normal.  Retroflexed views revealed internal Grade I hemorrhoids. The time to cecum=3 minutes 34 seconds.  Withdrawal time=12 minutes 11 seconds.  The scope was withdrawn and the procedure completed. COMPLICATIONS: There were no immediate complications.  ENDOSCOPIC  IMPRESSION: 1.   Sessile polyp in the ascending colon; polypectomy performed with cold forceps 2.   Mild diverticulosis in the descending colon 3.   Grade l internal hemorrhodis  RECOMMENDATIONS: 1.  Await pathology results 2.  Repeat colonoscopy in 5 years if polyp adenomatous; otherwise 10 years 3.  High fiber diet with liberal fluid intake. 4.  Trial of Linzess 145 mcg daily, 6 months supply 5.  Office appt in 4-6 weeks  eSigned:  Ladene Artist, MD, [facility] 03/22/2014 3:05 PM

## 2014-03-22 NOTE — Patient Instructions (Signed)
Discharge instructions given. Handouts on polyps,diverticulosis,hemorrhoids,gastritis and a hiatal hernia. Resume previous medications. Office appointment in 4-6 weeks-office will schedule. Prescriptions sent in to pharmacy. YOU HAD AN ENDOSCOPIC PROCEDURE TODAY AT [facility]:   Refer to the procedure report that was given to you for any specific questions about what was found during the examination.  If the procedure report does not answer your questions, please call your gastroenterologist to clarify.  If you requested that your care partner not be given the details of your procedure findings, then the procedure report has been included in a sealed envelope for you to review at your convenience later.  YOU SHOULD EXPECT: Some feelings of bloating in the abdomen. Passage of more gas than usual.  Walking can help get rid of the air that was put into your GI tract during the procedure and reduce the bloating. If you had a lower endoscopy (such as a colonoscopy or flexible sigmoidoscopy) you may notice spotting of blood in your stool or on the toilet paper. If you underwent a bowel prep for your procedure, you may not have a normal bowel movement for a few days.  Please Note:  You might notice some irritation and congestion in your nose or some drainage.  This is from the oxygen used during your procedure.  There is no need for concern and it should clear up in a day or so.  SYMPTOMS TO REPORT IMMEDIATELY:   Following lower endoscopy (colonoscopy or flexible sigmoidoscopy):  Excessive amounts of blood in the stool  Significant tenderness or worsening of abdominal pains  Swelling of the abdomen that is new, acute  Fever of 100F or higher   Following upper endoscopy (EGD)  Vomiting of blood or coffee ground material  New chest pain or pain under the shoulder blades  Painful or persistently difficult swallowing  New shortness of breath  Fever of 100F or higher  Black,  tarry-looking stools  For urgent or emergent issues, a gastroenterologist can be reached at any hour by calling [phone].   DIET: Your first meal following the procedure should be a small meal and then it is ok to progress to your normal diet. Heavy or fried foods are harder to digest and may make you feel nauseous or bloated.  Likewise, meals heavy in dairy and vegetables can increase bloating.  Drink plenty of fluids but you should avoid alcoholic beverages for 24 hours.  ACTIVITY:  You should plan to take it easy for the rest of today and you should NOT DRIVE or use heavy machinery until tomorrow (because of the sedation medicines used during the test).    FOLLOW UP: Our staff will call the number listed on your records the next business day following your procedure to check on you and address any questions or concerns that you may have regarding the information given to you following your procedure. If we do not reach you, we will leave a message.  However, if you are feeling well and you are not experiencing any problems, there is no need to return our call.  We will assume that you have returned to your regular daily activities without incident.  If any biopsies were taken you will be contacted by phone or by letter within the next 1-3 weeks.  Please call us at [phone] if you have not heard about the biopsies in 3 weeks.    SIGNATURES/CONFIDENTIALITY: You and/or your care partner have signed paperwork which will be entered  into your electronic medical record.  These signatures attest to the fact that that the information above on your After Visit Summary has been reviewed and is understood.  Full responsibility of the confidentiality of this discharge information lies with you and/or your care-partner.

## 2014-03-23 ENCOUNTER — Telehealth: Payer: Self-pay | Admitting: *Deleted

## 2014-03-23 NOTE — Telephone Encounter (Signed)
  Follow up Call-  Call back number 03/22/2014  Post procedure Call Back phone  # [phone]  Permission to leave phone message Yes     Patient questions:  Do you have a fever, pain , or abdominal swelling? No. Pain Score  0 *  Have you tolerated food without any problems? Yes.    Have you been able to return to your normal activities? Yes.    Do you have any questions about your discharge instructions: Diet   No. Medications  No. Follow up visit  No.  Do you have questions or concerns about your Care? No.  Actions: * If pain score is 4 or above: No action needed, pain <4.

## 2014-03-26 ENCOUNTER — Encounter: Payer: Self-pay | Admitting: Family Medicine

## 2014-03-26 ENCOUNTER — Ambulatory Visit (INDEPENDENT_AMBULATORY_CARE_PROVIDER_SITE_OTHER): Payer: Medicare Other | Admitting: Family Medicine

## 2014-03-26 VITALS — BP 128/88 | HR 62 | Temp 98.0°F | Ht 68.25 in | Wt 181.9 lb

## 2014-03-26 DIAGNOSIS — F329 Major depressive disorder, single episode, unspecified: Secondary | ICD-10-CM | POA: Diagnosis not present

## 2014-03-26 DIAGNOSIS — I10 Essential (primary) hypertension: Secondary | ICD-10-CM | POA: Diagnosis not present

## 2014-03-26 DIAGNOSIS — F32A Depression, unspecified: Secondary | ICD-10-CM

## 2014-03-26 DIAGNOSIS — K219 Gastro-esophageal reflux disease without esophagitis: Secondary | ICD-10-CM

## 2014-03-26 DIAGNOSIS — J4520 Mild intermittent asthma, uncomplicated: Secondary | ICD-10-CM

## 2014-03-26 DIAGNOSIS — E039 Hypothyroidism, unspecified: Secondary | ICD-10-CM | POA: Diagnosis not present

## 2014-03-26 DIAGNOSIS — E785 Hyperlipidemia, unspecified: Secondary | ICD-10-CM | POA: Diagnosis not present

## 2014-03-26 HISTORY — DX: Hypothyroidism, unspecified: E03.9

## 2014-03-26 LAB — LIPID PANEL
CHOL/HDL RATIO: 3
CHOLESTEROL: 211 mg/dL — AB (ref 0–200)
HDL: 66.3 mg/dL (ref >39.00)
LDL CALC: 115 mg/dL — AB (ref 0–99)
NonHDL: 144.7
Triglycerides: 148 mg/dL (ref 0.0–149.0)
VLDL: 29.6 mg/dL (ref 0.0–40.0)

## 2014-03-26 LAB — TSH: TSH: 0.65 u[IU]/mL (ref 0.35–4.50)

## 2014-03-26 NOTE — Progress Notes (Signed)
HPI:  Depression/Insomnia: -she decided to wean off the pristiq 01/2013 - but did not do well off of it and depression recurred so she restarted this -trazadone for sleep nightly but this makes her tired -reports: she wants to be on as little as possible -denies: apniec spells, daytime somnelence  URI - Asthma/Allergies: -on prednisone from her asthma/allergy specialist currently -sees [facility] -takes symbicort and singulair, xyzal  HTN: -losartan-hctz 1.5 tablets daily -reports lisinopril caused a cough in the past -denies: CP, SOB, DOE, swelling  Hypothyroidism: -reports stable on 129mcg of synthroid for many years -denies: hot/cold intol, palpitations   ROS: See pertinent positives and negatives per HPI.  Past Medical History  Diagnosis Date  . Arthritis   . Depression   . Allergy   . Asthma   . GERD (gastroesophageal reflux disease)     hiatal hernia  . Hypertension   . Hyperlipidemia   . Fibroids   . Diverticulosis of colon   . Blunt head trauma   . Premature ventricular contractions   . IBS (irritable bowel syndrome)   . Edema 08/12/2009    Qualifier: Diagnosis of  By: Arnoldo Morale MD, Balinda Quails   . Pelvic pain in female, chronic 10/24/2013  . BACK PAIN, LUMBAR, WITH RADICULOPATHY 09/12/2007    Qualifier: Diagnosis of  By: Arnoldo Morale MD, Balinda Quails   . Hypothyroidism 03/26/2014  . Schatzki's ring     on EGD 2016    Past Surgical History  Procedure Laterality Date  . Breast surgery      fibroid tumors  . Dilation and curettage of uterus      Family History  Problem Relation Age of Onset  . Coronary artery disease Mother   . Heart disease Mother   . Stomach cancer Father   . Colon cancer Neg Hx   . Colon polyps Mother   . Diabetes Mother   . Kidney disease Neg Hx   . Esophageal cancer Neg Hx   . Gallbladder disease Neg Hx     History   Social History  . Marital Status: Married    Spouse Name: N/A  . Number of Children: 2  . Years of  Education: N/A   Occupational History  . Housewife    Social History Main Topics  . Smoking status: Former Smoker -- 0.50 packs/day for 20 years    Types: Cigarettes    Quit date: 01/20/1988  . Smokeless tobacco: Never Used     Comment: quit remotely  . Alcohol Use: 0.0 oz/week    0 Standard drinks or equivalent per week     Comment: very rare and a little wine  . Drug Use: No  . Sexual Activity: Yes   Other Topics Concern  . None   Social History Narrative   Work or School: retired - used to work in [place] Situation: lives with husband      Spiritual Beliefs: Christian      Lifestyle: walks and goes to the gym - diet is healthy              Current outpatient prescriptions:  .  desvenlafaxine (PRISTIQ) 100 MG 24 hr tablet, 100 mg. Take 0.5 tablet every day, Disp: , Rfl:  .  Fluticasone Furoate-Vilanterol 100-25 MCG/INH AEPB, Inhale into the lungs every morning., Disp: , Rfl:  .  levocetirizine (XYZAL) 5 MG tablet, Take 5 mg by mouth every evening., Disp: , Rfl:  .  levothyroxine (SYNTHROID) 137 MCG tablet, Take 1 tablet (137 mcg total) by mouth daily before breakfast., Disp: 30 tablet, Rfl: 0 .  Linaclotide (LINZESS) 145 MCG CAPS capsule, Take 1 capsule (145 mcg total) by mouth daily., Disp: 30 capsule, Rfl: 6 .  losartan-hydrochlorothiazide (HYZAAR) 50-12.5 MG per tablet, Take 1.5 tablets by mouth daily. Take 1.5 tablet every day, Disp: 135 tablet, Rfl: 3 .  montelukast (SINGULAIR) 10 MG tablet, Take 10 mg by mouth at bedtime., Disp: , Rfl:  .  omeprazole (PRILOSEC) 20 MG capsule, Take 1 capsule (20 mg total) by mouth daily., Disp: 90 capsule, Rfl: 3 .  traZODone (DESYREL) 50 MG tablet, Take 50 mg by mouth as needed. , Disp: , Rfl:  .  [DISCONTINUED] Amlodipine-Valsartan-HCTZ 5-160-12.5 MG TABS, Take 1 tablet by mouth daily., Disp: 90 tablet, Rfl: 3 .  [DISCONTINUED] Rivaroxaban 20 MG TABS, Take 20 mg by mouth daily., Disp: 30 tablet, Rfl:  6  EXAM:  Filed Vitals:   03/26/14 0852  BP: 128/88  Pulse: 62  Temp: 98 F (36.7 C)    Body mass index is 27.44 kg/(m^2).  GENERAL: vitals reviewed and listed above, alert, oriented, appears well hydrated and in no acute distress  HEENT: atraumatic, conjunttiva clear, no obvious abnormalities on inspection of external nose and ears  NECK: no obvious masses on inspection  LUNGS: clear to auscultation bilaterally, no wheezes, rales or rhonchi, good air movement  CV: HRRR, no peripheral edema  MS: moves all extremities without noticeable abnormality  PSYCH: pleasant and cooperative, no obvious depression or anxiety  ASSESSMENT AND PLAN:  Discussed the following assessment and plan:  Hypothyroidism, unspecified hypothyroidism type - Plan: TSH  Essential hypertension  Depression  Asthma, chronic, mild intermittent, uncomplicated  Gastroesophageal reflux disease, esophagitis presence not specified  Dyslipidemia - Plan: Lipid Panel  -lifestyle recs -FASTING labs -Patient advised to return or notify a doctor immediately if symptoms worsen or persist or new concerns arise.  Patient Instructions  BEFORE YOU LEAVE: -labs -follow up in 3-4 months - pneumonia vaccine that visit  We recommend the following healthy lifestyle measures: - eat a healthy diet consisting of lots of vegetables, fruits, beans, nuts, seeds, healthy meats such as white chicken and fish and whole grains.  - avoid fried foods, fast food, processed foods, sodas, red meet and other fattening foods.  - get a least 150 minutes of aerobic exercise per week.       Colin Benton R.

## 2014-03-26 NOTE — Progress Notes (Signed)
Pre visit review using our clinic review tool, if applicable. No additional management support is needed unless otherwise documented below in the visit note. 

## 2014-03-26 NOTE — Patient Instructions (Signed)
BEFORE YOU LEAVE: -labs -follow up in 3-4 months - pneumonia vaccine that visit  We recommend the following healthy lifestyle measures: - eat a healthy diet consisting of lots of vegetables, fruits, beans, nuts, seeds, healthy meats such as white chicken and fish and whole grains.  - avoid fried foods, fast food, processed foods, sodas, red meet and other fattening foods.  - get a least 150 minutes of aerobic exercise per week.

## 2014-04-02 ENCOUNTER — Encounter: Payer: Self-pay | Admitting: Gastroenterology

## 2014-04-03 ENCOUNTER — Telehealth: Payer: Self-pay | Admitting: Gastroenterology

## 2014-04-03 NOTE — Telephone Encounter (Signed)
Patient reports abdominal pain that started about 2 weeks since starting on Prilosec.  She does have some nexium at home she asks if she can try rather than the Prilosec.  She will call back if the nexium works for her and does not have any side effects in a few weeks for a rx.

## 2014-04-18 ENCOUNTER — Encounter: Payer: Self-pay | Admitting: Family Medicine

## 2014-05-07 ENCOUNTER — Ambulatory Visit (INDEPENDENT_AMBULATORY_CARE_PROVIDER_SITE_OTHER): Payer: Medicare Other | Admitting: Gastroenterology

## 2014-05-07 ENCOUNTER — Encounter: Payer: Self-pay | Admitting: Gastroenterology

## 2014-05-07 VITALS — BP 120/78 | HR 76 | Ht 68.25 in | Wt 183.8 lb

## 2014-05-07 DIAGNOSIS — K219 Gastro-esophageal reflux disease without esophagitis: Secondary | ICD-10-CM

## 2014-05-07 DIAGNOSIS — R102 Pelvic and perineal pain: Secondary | ICD-10-CM

## 2014-05-07 NOTE — Progress Notes (Signed)
    History of Present Illness: This is a 64 year old female who returns with persistent, intermittent lower abdominal pain, pelvic pain. Her symptoms are not related to any digestive function. Symptoms have not changed with improvement in constipation. She had incontinence with Linzess so this was discontinued. Colonoscopy showed mild descending colon diverticulosis, one small adenomatous colon polyp and internal hemorrhoids. Upper endoscopy revealed mild inactive gastritis, Schatzki ring and a hiatal hernia. Daily omeprazole caused abdominal pain so this was discontinued. Nexium was not effective in controlling her reflux symptoms.  Current Medications, Allergies, Past Medical History, Past Surgical History, Family History and Social History were reviewed in [employer] record.  Physical Exam: General: Well developed , well nourished, no acute distress Head: Normocephalic and atraumatic Eyes:  sclerae anicteric, EOMI Ears: Normal auditory acuity Mouth: No deformity or lesions Lungs: Clear throughout to auscultation Heart: Regular rate and rhythm; no murmurs, rubs or bruits Abdomen: Soft, non tender and non distended. No masses, hepatosplenomegaly or hernias noted. Normal Bowel sounds Musculoskeletal: Symmetrical with no gross deformities  Pulses:  Normal pulses noted Extremities: No clubbing, cyanosis, edema or deformities noted Neurological: Alert oriented x 4, grossly nonfocal Psychological:  Alert and cooperative. Normal mood and affect  Assessment and Recommendations:  1. Lower abdominal pain, pelvic pain. No GI cause found. No association of her symptoms with any digestive function. Symptoms intermittent. Although she has a history of irritable bowel syndrome her current symptoms are not related to IBS. Follow-up with her PCP and GYN.  2. Constipation. Continue increased dietary fiber and water intake. Linzess lead to incontinence and this was discontinued.     3. Thickened esophagus on it this was discontinued. Nexium does not appear to be effective. Trial of Prevacid OTC and Zegerid OTC. All all standard antireflux measures. Literature supplied.  4. Personal history of adenomatous colon polyps. Surveillance colonoscopy recommended a 5 year interval in 03/2019.  I spent 15 minutes of face-to-face time with the patient. Over 50% of the time was spent counseling and coordinating care.

## 2014-05-07 NOTE — Patient Instructions (Signed)
Patient advised to avoid spicy, acidic, citrus, chocolate, mints, fruit and fruit juices.  Limit the intake of caffeine, alcohol and Soda.  Don't exercise too soon after eating.  Don't lie down within 3-4 hours of eating.  Elevate the head of your bed.  Try over the counter Prevacid or Zegerid in the place of Prilosec.  Thank you for choosing me and [facility].  Pricilla Riffle. Dagoberto Ligas., MD., Marval Regal

## 2014-05-24 ENCOUNTER — Encounter: Payer: Self-pay | Admitting: Family Medicine

## 2014-05-25 DIAGNOSIS — F329 Major depressive disorder, single episode, unspecified: Secondary | ICD-10-CM | POA: Diagnosis not present

## 2014-06-05 DIAGNOSIS — R1032 Left lower quadrant pain: Secondary | ICD-10-CM | POA: Diagnosis not present

## 2014-06-11 DIAGNOSIS — H1045 Other chronic allergic conjunctivitis: Secondary | ICD-10-CM | POA: Diagnosis not present

## 2014-06-11 DIAGNOSIS — J301 Allergic rhinitis due to pollen: Secondary | ICD-10-CM | POA: Diagnosis not present

## 2014-06-11 DIAGNOSIS — J4530 Mild persistent asthma, uncomplicated: Secondary | ICD-10-CM | POA: Diagnosis not present

## 2014-06-11 DIAGNOSIS — J3089 Other allergic rhinitis: Secondary | ICD-10-CM | POA: Diagnosis not present

## 2014-06-14 ENCOUNTER — Ambulatory Visit (INDEPENDENT_AMBULATORY_CARE_PROVIDER_SITE_OTHER): Payer: Medicare Other | Admitting: Internal Medicine

## 2014-06-14 ENCOUNTER — Encounter: Payer: Self-pay | Admitting: Internal Medicine

## 2014-06-14 VITALS — BP 140/88 | HR 65 | Ht 69.0 in | Wt 182.0 lb

## 2014-06-14 DIAGNOSIS — I493 Ventricular premature depolarization: Secondary | ICD-10-CM

## 2014-06-14 NOTE — Patient Instructions (Signed)
Medication Instructions:  Your physician recommends that you continue on your current medications as directed. Please refer to the Current Medication list given to you today.  Labwork: None ordered  Testing/Procedures: None ordered  Follow-Up: Your physician wants you to follow-up in: 1 year with Dr. Caryl Comes.  You will receive a reminder letter in the mail two months in advance. If you don't receive a letter, please call our office to schedule the follow-up appointment.   Thank you for choosing [facility]!!

## 2014-06-14 NOTE — Progress Notes (Signed)
Patient Care Team: [name], DO as PCP - General (Family Medicine)   HPI  Erica Marquez is a 64 y.o. female seen in followup for RVOT PVCs and Hypertension  She also carries a history of atrial fibrillation that I had given her associated with popcorn sensations. After reviewing all that i could find with her name on it i could find nothing with atrial fibrillation-- i dont know how I made the error Ammie Ferrier  For PVCs are much improved. She continues to have been occasionally and this has resumed following introduction of the asthmatic medications.  December 2015 her potassium level was 3.6 and magnesium level is 2.2..  She has brief fleeting chest pains lasting minutes unrelated to exertion;  she is able to walk vigorously for an hour without symptoms   .    Past Medical History  Diagnosis Date  . Arthritis   . Depression   . Allergy   . Asthma   . GERD (gastroesophageal reflux disease)     hiatal hernia  . Hypertension   . Hyperlipidemia   . Fibroids   . Diverticulosis of colon   . Blunt head trauma   . Premature ventricular contractions   . IBS (irritable bowel syndrome)   . Edema 08/12/2009    Qualifier: Diagnosis of  By: Arnoldo Morale MD, Balinda Quails   . Pelvic pain in female, chronic 10/24/2013  . BACK PAIN, LUMBAR, WITH RADICULOPATHY 09/12/2007    Qualifier: Diagnosis of  By: Arnoldo Morale MD, Balinda Quails   . Hypothyroidism 03/26/2014  . Schatzki's ring     on EGD 2016  . Tubular adenoma of colon 03/2014    Past Surgical History  Procedure Laterality Date  . Breast surgery      fibroid tumors  . Dilation and curettage of uterus      Current Outpatient Prescriptions  Medication Sig Dispense Refill  . budesonide-formoterol (SYMBICORT) 160-4.5 MCG/ACT inhaler Inhale 2 puffs into the lungs 2 (two) times daily.    Marland Kitchen desvenlafaxine (PRISTIQ) 100 MG 24 hr tablet Take 0.5 tablet by mouth every day    . esomeprazole (NEXIUM) 40 MG capsule Take 40 mg by mouth daily at 12 noon.    Marland Kitchen  levocetirizine (XYZAL) 5 MG tablet Take 5 mg by mouth every evening.    Marland Kitchen levothyroxine (SYNTHROID) 137 MCG tablet Take 1 tablet (137 mcg total) by mouth daily before breakfast. 30 tablet 0  . losartan-hydrochlorothiazide (HYZAAR) 50-12.5 MG per tablet Take 1.5 tablets by mouth daily.    . montelukast (SINGULAIR) 10 MG tablet Take 10 mg by mouth at bedtime.    . traZODone (DESYREL) 50 MG tablet Take 50 mg by mouth as needed.     . [DISCONTINUED] Amlodipine-Valsartan-HCTZ 5-160-12.5 MG TABS Take 1 tablet by mouth daily. 90 tablet 3  . [DISCONTINUED] Rivaroxaban 20 MG TABS Take 20 mg by mouth daily. 30 tablet 6   No current facility-administered medications for this visit.    No Known Allergies  Review of Systems negative except from HPI and PMH  Physical Exam BP 140/88 mmHg  Pulse 65  Ht 5\' 9"  (1.753 m)  Wt 182 lb (82.555 kg)  BMI 26.86 kg/m2  Repeat blood pressure of 165   Well developed and nourished in no acute distress HENT normal Neck supple with JVP-flat Clear Regular rate and rhythm, no murmurs or gallops Abd-soft with active BS No Clubbing cyanosis edema Skin-warm and dry A & Oriented  Grossly normal sensory and motor  function   ECG demonstrates sinus rhythm at 65 Intervals 14/08/40 Axis is 46  Assessment and  Plan  RVOT-PVCs  Hypertension  Chest pain-atypical  Palpitations are well controlled. We'll control her blood pressure unfortunately is not. I will have her check her blood pressures home every other week. She is to see her PCP in 2 months time. I have explained to her that based on the SPRINT TRIAL and a recent trial from [facility] blood pressure targets are now 120 range.  We discussed the importance of exercise and high potassium foods.  I don't think her chest pain is cardiac.

## 2014-07-03 DIAGNOSIS — D259 Leiomyoma of uterus, unspecified: Secondary | ICD-10-CM | POA: Diagnosis not present

## 2014-07-16 ENCOUNTER — Encounter: Payer: Self-pay | Admitting: Family Medicine

## 2014-07-27 DIAGNOSIS — D2271 Melanocytic nevi of right lower limb, including hip: Secondary | ICD-10-CM | POA: Diagnosis not present

## 2014-07-27 DIAGNOSIS — X32XXXA Exposure to sunlight, initial encounter: Secondary | ICD-10-CM | POA: Diagnosis not present

## 2014-07-27 DIAGNOSIS — D2272 Melanocytic nevi of left lower limb, including hip: Secondary | ICD-10-CM | POA: Diagnosis not present

## 2014-07-27 DIAGNOSIS — D2262 Melanocytic nevi of left upper limb, including shoulder: Secondary | ICD-10-CM | POA: Diagnosis not present

## 2014-07-27 DIAGNOSIS — L578 Other skin changes due to chronic exposure to nonionizing radiation: Secondary | ICD-10-CM | POA: Diagnosis not present

## 2014-07-27 DIAGNOSIS — L720 Epidermal cyst: Secondary | ICD-10-CM | POA: Diagnosis not present

## 2014-07-27 DIAGNOSIS — L821 Other seborrheic keratosis: Secondary | ICD-10-CM | POA: Diagnosis not present

## 2014-07-27 DIAGNOSIS — D225 Melanocytic nevi of trunk: Secondary | ICD-10-CM | POA: Diagnosis not present

## 2014-07-27 DIAGNOSIS — L7211 Pilar cyst: Secondary | ICD-10-CM | POA: Diagnosis not present

## 2014-07-27 DIAGNOSIS — D2261 Melanocytic nevi of right upper limb, including shoulder: Secondary | ICD-10-CM | POA: Diagnosis not present

## 2014-07-27 DIAGNOSIS — L814 Other melanin hyperpigmentation: Secondary | ICD-10-CM | POA: Diagnosis not present

## 2014-07-27 DIAGNOSIS — D2339 Other benign neoplasm of skin of other parts of face: Secondary | ICD-10-CM | POA: Diagnosis not present

## 2014-07-30 ENCOUNTER — Encounter: Payer: Self-pay | Admitting: Family Medicine

## 2014-07-30 ENCOUNTER — Ambulatory Visit (INDEPENDENT_AMBULATORY_CARE_PROVIDER_SITE_OTHER): Payer: Medicare Other | Admitting: Family Medicine

## 2014-07-30 VITALS — BP 110/80 | HR 77 | Temp 98.1°F | Ht 69.0 in | Wt 181.2 lb

## 2014-07-30 DIAGNOSIS — K219 Gastro-esophageal reflux disease without esophagitis: Secondary | ICD-10-CM | POA: Diagnosis not present

## 2014-07-30 DIAGNOSIS — E039 Hypothyroidism, unspecified: Secondary | ICD-10-CM

## 2014-07-30 DIAGNOSIS — F329 Major depressive disorder, single episode, unspecified: Secondary | ICD-10-CM

## 2014-07-30 DIAGNOSIS — I10 Essential (primary) hypertension: Secondary | ICD-10-CM

## 2014-07-30 DIAGNOSIS — F32A Depression, unspecified: Secondary | ICD-10-CM

## 2014-07-30 DIAGNOSIS — J4520 Mild intermittent asthma, uncomplicated: Secondary | ICD-10-CM

## 2014-07-30 LAB — BASIC METABOLIC PANEL
BUN: 12 mg/dL (ref 6–23)
CALCIUM: 10 mg/dL (ref 8.4–10.5)
CHLORIDE: 100 meq/L (ref 96–112)
CO2: 29 mEq/L (ref 19–32)
Creatinine, Ser: 0.68 mg/dL (ref 0.40–1.20)
GFR: 92.01 mL/min (ref >60.00)
Glucose, Bld: 98 mg/dL (ref 70–99)
POTASSIUM: 3.3 meq/L — AB (ref 3.5–5.1)
Sodium: 137 mEq/L (ref 135–145)

## 2014-07-30 LAB — TSH: TSH: 1.02 u[IU]/mL (ref 0.35–4.50)

## 2014-07-30 NOTE — Patient Instructions (Signed)
BEFORE YOU LEAVE: -follow up in 6 months -labs  We recommend the following healthy lifestyle measures: - eat a healthy diet consisting of lots of vegetables, fruits, beans, nuts, seeds, healthy meats such as white chicken and fish  - avoid fried foods, fast food, processed foods, sodas, red meet and other fattening foods.  - get a least 150 minutes of aerobic exercise per week.

## 2014-07-30 NOTE — Progress Notes (Signed)
HPI:  Acute visit for:  Breast Pruritis: -a few weeks ago -now resolved, thinks was insect bite - R lat breast -has mammo scheduled  Follow up:  URI - Asthma/Allergies: -sees [facility] -takes symbicort and singulair, allergra -prn symbicort  HTN: -losartan-hctz 1.5 tablets daily -reports lisinopril caused a cough in the past -denies: CP, SOB, DOE, swelling  Hypothyroidism: -reports stable on 135mcg of synthroid for many years -denies: hot/cold intol, palpitations  GERD/IBS: -on omeprazole for this -denies: dysphagia, weight loss, blood in stools -seeing GI and gyn  Depression and insomnia: -taking 50mg  daily of desvenlafaxine -denies: depression, anxiety -trazadone prn for sleep 2 times per week  ROS: See pertinent positives and negatives per HPI.  Past Medical History  Diagnosis Date  . Arthritis   . Depression   . Allergy   . Asthma   . GERD (gastroesophageal reflux disease)     hiatal hernia  . Hypertension   . Hyperlipidemia   . Fibroids   . Diverticulosis of colon   . Blunt head trauma   . Premature ventricular contractions   . IBS (irritable bowel syndrome)   . Edema 08/12/2009    Qualifier: Diagnosis of  By: Arnoldo Morale MD, Balinda Quails   . Pelvic pain in female, chronic 10/24/2013  . BACK PAIN, LUMBAR, WITH RADICULOPATHY 09/12/2007    Qualifier: Diagnosis of  By: Arnoldo Morale MD, Balinda Quails   . Hypothyroidism 03/26/2014  . Schatzki's ring     on EGD 2016  . Tubular adenoma of colon 03/2014    Past Surgical History  Procedure Laterality Date  . Breast surgery      fibroid tumors  . Dilation and curettage of uterus      Family History  Problem Relation Age of Onset  . Coronary artery disease Mother   . Heart disease Mother   . Stomach cancer Father   . Colon cancer Neg Hx   . Colon polyps Mother   . Diabetes Mother   . Kidney disease Neg Hx   . Esophageal cancer Neg Hx   . Gallbladder disease Neg Hx     History   Social History  .  Marital Status: Married    Spouse Name: N/A  . Number of Children: 2  . Years of Education: N/A   Occupational History  . Housewife    Social History Main Topics  . Smoking status: Former Smoker -- 0.50 packs/day for 20 years    Types: Cigarettes    Quit date: 01/20/1988  . Smokeless tobacco: Never Used     Comment: quit remotely  . Alcohol Use: 0.0 oz/week    0 Standard drinks or equivalent per week     Comment: very rare and a little wine  . Drug Use: No  . Sexual Activity: Yes   Other Topics Concern  . None   Social History Narrative   Work or School: retired - used to work in [place] Situation: lives with husband      Spiritual Beliefs: Christian      Lifestyle: walks and goes to the gym - diet is healthy              Current outpatient prescriptions:  .  budesonide-formoterol (SYMBICORT) 160-4.5 MCG/ACT inhaler, Inhale 2 puffs into the lungs 2 (two) times daily., Disp: , Rfl:  .  desvenlafaxine (PRISTIQ) 100 MG 24 hr tablet, Take 0.5 tablet by mouth every day, Disp: , Rfl:  .  fexofenadine (ALLEGRA) 180 MG tablet, Take 180 mg by mouth daily., Disp: , Rfl:  .  levothyroxine (SYNTHROID) 137 MCG tablet, Take 1 tablet (137 mcg total) by mouth daily before breakfast., Disp: 30 tablet, Rfl: 0 .  losartan-hydrochlorothiazide (HYZAAR) 50-12.5 MG per tablet, Take 1.5 tablets by mouth daily., Disp: , Rfl:  .  montelukast (SINGULAIR) 10 MG tablet, Take 10 mg by mouth at bedtime., Disp: , Rfl:  .  omeprazole (PRILOSEC) 20 MG capsule, Take 20 mg by mouth daily., Disp: , Rfl:  .  Probiotic Product (ALIGN) 4 MG CAPS, Take by mouth., Disp: , Rfl:  .  traZODone (DESYREL) 50 MG tablet, Take 50 mg by mouth as needed. , Disp: , Rfl:  .  [DISCONTINUED] Amlodipine-Valsartan-HCTZ 5-160-12.5 MG TABS, Take 1 tablet by mouth daily., Disp: 90 tablet, Rfl: 3 .  [DISCONTINUED] Rivaroxaban 20 MG TABS, Take 20 mg by mouth daily., Disp: 30 tablet, Rfl: 6  EXAM:  Filed  Vitals:   07/30/14 0900  BP: 110/80  Pulse: 77  Temp: 98.1 F (36.7 C)    Body mass index is 26.75 kg/(m^2).  GENERAL: vitals reviewed and listed above, alert, oriented, appears well hydrated and in no acute distress  HEENT: atraumatic, conjunttiva clear, no obvious abnormalities on inspection of external nose and ears  NECK: no obvious masses on inspection  LUNGS: clear to auscultation bilaterally, no wheezes, rales or rhonchi, good air movement  CV: HRRR, no peripheral edema  BREAST: no abnormal findings, no skin rash  MS: moves all extremities without noticeable abnormality  PSYCH: pleasant and cooperative, no obvious depression or anxiety  ASSESSMENT AND PLAN:  Discussed the following assessment and plan:  Essential hypertension - Plan: Basic metabolic panel  Depression  Gastroesophageal reflux disease, esophagitis presence not specified  Hypothyroidism, unspecified hypothyroidism type - Plan: TSH  Asthma, chronic, mild intermittent, uncomplicated  -labs -breast exam normal, she will get mammogram -Patient advised to return or notify a doctor immediately if symptoms worsen or persist or new concerns arise.  Patient Instructions  BEFORE YOU LEAVE: -follow up in 6 months -labs  We recommend the following healthy lifestyle measures: - eat a healthy diet consisting of lots of vegetables, fruits, beans, nuts, seeds, healthy meats such as white chicken and fish  - avoid fried foods, fast food, processed foods, sodas, red meet and other fattening foods.  - get a least 150 minutes of aerobic exercise per week.       Colin Benton R.

## 2014-07-30 NOTE — Progress Notes (Signed)
Pre visit review using our clinic review tool, if applicable. No additional management support is needed unless otherwise documented below in the visit note. 

## 2014-07-31 ENCOUNTER — Telehealth: Payer: Self-pay | Admitting: *Deleted

## 2014-07-31 NOTE — Telephone Encounter (Signed)
Dr Kim-pt looked at her labs and questioned if she should begin taking potassium supplements she has that were given by Dr Caryl Comes?

## 2014-07-31 NOTE — Telephone Encounter (Signed)
If has not been taking a supplement recently, likely does not need it as lab is only mildly low.  If has been taking and wants to cont ok to do so.

## 2014-08-01 NOTE — Telephone Encounter (Signed)
Message sent via Mychart message.

## 2014-08-14 DIAGNOSIS — Z1231 Encounter for screening mammogram for malignant neoplasm of breast: Secondary | ICD-10-CM | POA: Diagnosis not present

## 2014-08-14 LAB — HM MAMMOGRAPHY

## 2014-08-16 ENCOUNTER — Encounter: Payer: Self-pay | Admitting: Family Medicine

## 2014-08-20 DIAGNOSIS — S83241A Other tear of medial meniscus, current injury, right knee, initial encounter: Secondary | ICD-10-CM | POA: Diagnosis not present

## 2014-08-20 DIAGNOSIS — M25561 Pain in right knee: Secondary | ICD-10-CM | POA: Diagnosis not present

## 2014-08-22 DIAGNOSIS — M25561 Pain in right knee: Secondary | ICD-10-CM | POA: Diagnosis not present

## 2014-08-23 DIAGNOSIS — M659 Synovitis and tenosynovitis, unspecified: Secondary | ICD-10-CM | POA: Diagnosis not present

## 2014-08-23 DIAGNOSIS — M25461 Effusion, right knee: Secondary | ICD-10-CM | POA: Diagnosis not present

## 2014-09-07 DIAGNOSIS — H1045 Other chronic allergic conjunctivitis: Secondary | ICD-10-CM | POA: Diagnosis not present

## 2014-09-07 DIAGNOSIS — H66002 Acute suppurative otitis media without spontaneous rupture of ear drum, left ear: Secondary | ICD-10-CM | POA: Diagnosis not present

## 2014-09-07 DIAGNOSIS — J301 Allergic rhinitis due to pollen: Secondary | ICD-10-CM | POA: Diagnosis not present

## 2014-09-07 DIAGNOSIS — J3089 Other allergic rhinitis: Secondary | ICD-10-CM | POA: Diagnosis not present

## 2014-11-06 ENCOUNTER — Telehealth: Payer: Self-pay | Admitting: Family Medicine

## 2014-11-06 MED ORDER — LOSARTAN POTASSIUM-HCTZ 50-12.5 MG PO TABS: 1.5000 | ORAL_TABLET | Freq: Every day | ORAL | Status: DC

## 2014-11-06 MED ORDER — SYNTHROID 137 MCG PO TABS: 137.0000 ug | ORAL_TABLET | Freq: Every day | ORAL | Status: DC

## 2014-11-06 NOTE — Telephone Encounter (Signed)
Pt needs refills on new brand synthroid 137 mcg #90 and losartan-hctz 50-12.5 mg #90 w/refills sent to [facility] in [place] ,[place]

## 2014-11-06 NOTE — Telephone Encounter (Signed)
Rxs done. 

## 2014-11-06 NOTE — Telephone Encounter (Signed)
Ok to send refills? 

## 2014-11-22 DIAGNOSIS — N393 Stress incontinence (female) (male): Secondary | ICD-10-CM | POA: Diagnosis not present

## 2014-11-27 DIAGNOSIS — F329 Major depressive disorder, single episode, unspecified: Secondary | ICD-10-CM | POA: Diagnosis not present

## 2014-12-25 DIAGNOSIS — S83241A Other tear of medial meniscus, current injury, right knee, initial encounter: Secondary | ICD-10-CM | POA: Diagnosis not present

## 2014-12-25 DIAGNOSIS — M25531 Pain in right wrist: Secondary | ICD-10-CM | POA: Diagnosis not present

## 2014-12-25 DIAGNOSIS — M25561 Pain in right knee: Secondary | ICD-10-CM | POA: Diagnosis not present

## 2015-01-16 DIAGNOSIS — H25013 Cortical age-related cataract, bilateral: Secondary | ICD-10-CM | POA: Diagnosis not present

## 2015-01-16 DIAGNOSIS — H5203 Hypermetropia, bilateral: Secondary | ICD-10-CM | POA: Diagnosis not present

## 2015-01-31 ENCOUNTER — Ambulatory Visit (INDEPENDENT_AMBULATORY_CARE_PROVIDER_SITE_OTHER): Payer: Medicare Other | Admitting: Family Medicine

## 2015-01-31 ENCOUNTER — Encounter: Payer: Self-pay | Admitting: Family Medicine

## 2015-01-31 VITALS — BP 110/80 | HR 81 | Temp 97.7°F | Ht 69.0 in | Wt 180.1 lb

## 2015-01-31 DIAGNOSIS — R0789 Other chest pain: Secondary | ICD-10-CM

## 2015-01-31 DIAGNOSIS — E039 Hypothyroidism, unspecified: Secondary | ICD-10-CM

## 2015-01-31 DIAGNOSIS — K219 Gastro-esophageal reflux disease without esophagitis: Secondary | ICD-10-CM | POA: Diagnosis not present

## 2015-01-31 DIAGNOSIS — I10 Essential (primary) hypertension: Secondary | ICD-10-CM | POA: Diagnosis not present

## 2015-01-31 DIAGNOSIS — E785 Hyperlipidemia, unspecified: Secondary | ICD-10-CM

## 2015-01-31 LAB — BASIC METABOLIC PANEL
BUN: 15 mg/dL (ref 6–23)
CO2: 31 mEq/L (ref 19–32)
CREATININE: 0.78 mg/dL (ref 0.40–1.20)
Calcium: 9.6 mg/dL (ref 8.4–10.5)
Chloride: 102 mEq/L (ref 96–112)
GFR: 78.42 mL/min (ref >60.00)
Glucose, Bld: 100 mg/dL — ABNORMAL HIGH (ref 70–99)
Potassium: 3.2 mEq/L — ABNORMAL LOW (ref 3.5–5.1)
Sodium: 140 mEq/L (ref 135–145)

## 2015-01-31 LAB — LIPID PANEL
CHOLESTEROL: 280 mg/dL — AB (ref 0–200)
HDL: 60.9 mg/dL (ref >39.00)
LDL CALC: 194 mg/dL — AB (ref 0–99)
NonHDL: 218.86
Total CHOL/HDL Ratio: 5
Triglycerides: 122 mg/dL (ref 0.0–149.0)
VLDL: 24.4 mg/dL (ref 0.0–40.0)

## 2015-01-31 LAB — TSH: TSH: 1.15 u[IU]/mL (ref 0.35–4.50)

## 2015-01-31 NOTE — Patient Instructions (Signed)
BEFORE YOU LEAVE: -labs -follow up in 1 month   prilosec 20mg  daily for 2 weeks - available over the counter  Heat and tylenol as needed for chest soreness  Follow up with your cardiologist as planned/needed  We recommend the following healthy lifestyle measures: - eat a healthy whole foods diet consisting of regular small meals composed of vegetables, fruits, beans, nuts, seeds, healthy meats such as white chicken and fish and whole grains.  - avoid sweets, white starchy foods, fried foods, fast food, processed foods, sodas, red meet and other fattening foods.  - get a least 150-300 minutes of aerobic exercise per week.    -We have ordered labs or studies at this visit. It can take up to 1-2 weeks for results and processing. We will contact you with instructions IF your results are abnormal. Normal results will be released to your [facility]. If you have not heard from [place] or can not find your results in [facility] in 2 weeks please contact our office.

## 2015-01-31 NOTE — Addendum Note (Signed)
Addended by: Agnes Lawrence on: 01/31/2015 09:49 AM   Modules accepted: Orders, Medications

## 2015-01-31 NOTE — Progress Notes (Signed)
HPI:  Follow up:  HTN/HLD: -meds: osartan-hctz 1.5 tablets daily- reports occ SBP in 140s in the later part of the day on home cuff -reported lisinopril caused a cough in the past -denies: CP, SOB, DOE, swelling  Hypothyroidism: -reports stable on 162mcg of synthroid for many years -denies: hot/cold intol, palpitations  GERD/IBS: -no longer takes PPI -she has had a few episodes of indigestion/mild burning R parasternal region in last few weeks, occurs at rest - never with activity -denies: dysphagia, weight loss, blood in stools -seeing GI and gyn -of note - sees Dr. Jens Som for reported hx CP with extensive cardiac eval with cath and stress test and "misdiagnosis" of A. Fib - she plans to see him for this chest pain  Depression and insomnia: -taking 50mg  daily of desvenlafaxine -denies: depression, anxiety on medications -trazadone prn for sleep 2 times per week  Asthma/Allergies: -sees [facility] -takes symbicort and singulair, allergra -prn symbicort   ROS: See pertinent positives and negatives per HPI.  Past Medical History  Diagnosis Date  . Arthritis   . Depression   . Allergy   . Asthma   . GERD (gastroesophageal reflux disease)     hiatal hernia  . Hypertension   . Hyperlipidemia   . Fibroids   . Diverticulosis of colon   . Blunt head trauma   . Premature ventricular contractions   . IBS (irritable bowel syndrome)   . Edema 08/12/2009    Qualifier: Diagnosis of  By: Arnoldo Morale MD, Balinda Quails   . Pelvic pain in female, chronic 10/24/2013  . BACK PAIN, LUMBAR, WITH RADICULOPATHY 09/12/2007    Qualifier: Diagnosis of  By: Arnoldo Morale MD, Balinda Quails   . Hypothyroidism 03/26/2014  . Schatzki's ring     on EGD 2016  . Tubular adenoma of colon 03/2014  . Chest pain     s/p extensive nag cardiac eval with cath, stress test and sees Dr. Jens Som    Past Surgical History  Procedure Laterality Date  . Breast surgery      fibroid tumors  . Dilation and curettage  of uterus      Family History  Problem Relation Age of Onset  . Coronary artery disease Mother   . Heart disease Mother   . Stomach cancer Father   . Colon cancer Neg Hx   . Colon polyps Mother   . Diabetes Mother   . Kidney disease Neg Hx   . Esophageal cancer Neg Hx   . Gallbladder disease Neg Hx     Social History   Social History  . Marital Status: Married    Spouse Name: N/A  . Number of Children: 2  . Years of Education: N/A   Occupational History  . Housewife    Social History Main Topics  . Smoking status: Former Smoker -- 0.50 packs/day for 20 years    Types: Cigarettes    Quit date: 01/20/1988  . Smokeless tobacco: Never Used     Comment: quit remotely  . Alcohol Use: 0.0 oz/week    0 Standard drinks or equivalent per week     Comment: very rare and a little wine  . Drug Use: No  . Sexual Activity: Yes   Other Topics Concern  . None   Social History Narrative   Work or School: retired - used to work in [place] Situation: lives with husband      Spiritual Beliefs: Darrick Meigs  Lifestyle: walks and goes to the gym - diet is healthy              Current outpatient prescriptions:  .  budesonide-formoterol (SYMBICORT) 160-4.5 MCG/ACT inhaler, Inhale 2 puffs into the lungs 2 (two) times daily., Disp: , Rfl:  .  desvenlafaxine (PRISTIQ) 100 MG 24 hr tablet, Take 0.5 tablet by mouth every day, Disp: , Rfl:  .  fexofenadine (ALLEGRA) 180 MG tablet, Take 180 mg by mouth daily., Disp: , Rfl:  .  levothyroxine (SYNTHROID) 137 MCG tablet, Take 1 tablet (137 mcg total) by mouth daily before breakfast., Disp: 30 tablet, Rfl: 0 .  losartan-hydrochlorothiazide (HYZAAR) 50-12.5 MG tablet, Take 1.5 tablets by mouth daily., Disp: 180 tablet, Rfl: 0 .  montelukast (SINGULAIR) 10 MG tablet, Take 10 mg by mouth at bedtime., Disp: , Rfl:  .  omeprazole (PRILOSEC) 20 MG capsule, Take 20 mg by mouth daily., Disp: , Rfl:  .  Probiotic Product  (ALIGN) 4 MG CAPS, Take by mouth., Disp: , Rfl:  .  SYNTHROID 137 MCG tablet, Take 1 tablet (137 mcg total) by mouth daily before breakfast., Disp: 90 tablet, Rfl: 0 .  traZODone (DESYREL) 50 MG tablet, Take 50 mg by mouth as needed. , Disp: , Rfl:  .  [DISCONTINUED] Amlodipine-Valsartan-HCTZ 5-160-12.5 MG TABS, Take 1 tablet by mouth daily., Disp: 90 tablet, Rfl: 3 .  [DISCONTINUED] Rivaroxaban 20 MG TABS, Take 20 mg by mouth daily., Disp: 30 tablet, Rfl: 6  EXAM:  Filed Vitals:   01/31/15 0858  BP: 110/80  Pulse: 81  Temp: 97.7 F (36.5 C)    Body mass index is 26.58 kg/(m^2).  GENERAL: vitals reviewed and listed above, alert, oriented, appears well hydrated and in no acute distress  HEENT: atraumatic, conjunttiva clear, no obvious abnormalities on inspection of external nose and ears  NECK: no obvious masses on inspection  LUNGS: clear to auscultation bilaterally, no wheezes, rales or rhonchi, good air movement  CV: HRRR, no peripheral edema  MS: moves all extremities without noticeable abnormality - mild TTP in area of concern lower R parasternal chest wall muscles  PSYCH: pleasant and cooperative, no obvious depression or anxiety  ASSESSMENT AND PLAN:  Discussed the following assessment and plan:  Essential hypertension - Plan: Basic metabolic panel  Hypothyroidism, unspecified hypothyroidism type - Plan: TSH  Gastroesophageal reflux disease, esophagitis presence not specified  Hyperlipemia - Plan: Lipid panel  Atypical chest pain -lipids,tsh,bmp -trial heat and stretching along with re-initiation of low dose PPI for short course for atypical CP - she also plans to follow up with her cardiologist, discussed return and emergency precautions if persists or worsens -Patient advised to return or notify a doctor immediately if symptoms worsen or persist or new concerns arise.  Patient Instructions  BEFORE YOU LEAVE: -labs -follow up in 1 month   prilosec 20mg   daily for 2 weeks - available over the counter  Heat and tylenol as needed for chest soreness  Follow up with your cardiologist as planned/needed  We recommend the following healthy lifestyle measures: - eat a healthy whole foods diet consisting of regular small meals composed of vegetables, fruits, beans, nuts, seeds, healthy meats such as white chicken and fish and whole grains.  - avoid sweets, white starchy foods, fried foods, fast food, processed foods, sodas, red meet and other fattening foods.  - get a least 150-300 minutes of aerobic exercise per week.    -We have ordered labs or studies at this  visit. It can take up to 1-2 weeks for results and processing. We will contact you with instructions IF your results are abnormal. Normal results will be released to your [facility]. If you have not heard from [place] or can not find your results in [facility] in 2 weeks please contact our office.             Colin Benton R.

## 2015-02-04 ENCOUNTER — Encounter: Payer: Self-pay | Admitting: Family Medicine

## 2015-02-07 ENCOUNTER — Other Ambulatory Visit: Payer: Self-pay | Admitting: Family Medicine

## 2015-02-15 ENCOUNTER — Other Ambulatory Visit: Payer: Self-pay | Admitting: Family Medicine

## 2015-03-01 ENCOUNTER — Other Ambulatory Visit: Payer: Self-pay | Admitting: Obstetrics and Gynecology

## 2015-03-01 DIAGNOSIS — Z124 Encounter for screening for malignant neoplasm of cervix: Secondary | ICD-10-CM | POA: Diagnosis not present

## 2015-03-04 ENCOUNTER — Ambulatory Visit (INDEPENDENT_AMBULATORY_CARE_PROVIDER_SITE_OTHER): Payer: Medicare Other | Admitting: Family Medicine

## 2015-03-04 ENCOUNTER — Encounter: Payer: Self-pay | Admitting: Family Medicine

## 2015-03-04 VITALS — BP 128/82 | HR 74 | Temp 98.5°F | Ht 69.0 in | Wt 184.2 lb

## 2015-03-04 DIAGNOSIS — E785 Hyperlipidemia, unspecified: Secondary | ICD-10-CM | POA: Diagnosis not present

## 2015-03-04 DIAGNOSIS — I10 Essential (primary) hypertension: Secondary | ICD-10-CM | POA: Diagnosis not present

## 2015-03-04 DIAGNOSIS — K219 Gastro-esophageal reflux disease without esophagitis: Secondary | ICD-10-CM | POA: Diagnosis not present

## 2015-03-04 DIAGNOSIS — E039 Hypothyroidism, unspecified: Secondary | ICD-10-CM | POA: Diagnosis not present

## 2015-03-04 DIAGNOSIS — R0789 Other chest pain: Secondary | ICD-10-CM

## 2015-03-04 LAB — CYTOLOGY - PAP

## 2015-03-04 MED ORDER — LOSARTAN POTASSIUM-HCTZ 50-12.5 MG PO TABS: 1.5000 | ORAL_TABLET | Freq: Every day | ORAL | Status: DC

## 2015-03-04 MED ORDER — SYNTHROID 137 MCG PO TABS: ORAL_TABLET | ORAL | Status: DC

## 2015-03-04 NOTE — Progress Notes (Signed)
HPI:  Follow up  Atypical CP/GERD/IBS: -sounded more acid or muscular in nature at last visit and did trial PPI and conservative care and she was to see her cardiologist as well -reports: CP resolved with treatments, still plans to see her cardiologist -still taking PPI  Questions about bone desity screening: -she can't remember when last had Dexa? -when told and offered repeat she declined for now  HTN: -brought cuff that she checks BP on at home - usually higher at home -staff checked BP on home and office cuff and home machine is running high -needs refill on medication -denies: Cp, SOB, DOE  HLD -she has been working on a Human resources officer and has been exercising -unfortunately admits is eating larger portions -seeing her cardiologist next week  ROS: See pertinent positives and negatives per HPI.  Past Medical History  Diagnosis Date  . Arthritis   . Depression   . Allergy   . Asthma   . GERD (gastroesophageal reflux disease)     hiatal hernia  . Hypertension   . Hyperlipidemia   . Fibroids   . Diverticulosis of colon   . Blunt head trauma   . Premature ventricular contractions   . IBS (irritable bowel syndrome)   . Edema 08/12/2009    Qualifier: Diagnosis of  By: Arnoldo Morale MD, Balinda Quails   . Pelvic pain in female, chronic 10/24/2013  . BACK PAIN, LUMBAR, WITH RADICULOPATHY 09/12/2007    Qualifier: Diagnosis of  By: Arnoldo Morale MD, Balinda Quails   . Hypothyroidism 03/26/2014  . Schatzki's ring     on EGD 2016  . Tubular adenoma of colon 03/2014  . Chest pain     s/p extensive nag cardiac eval with cath, stress test and sees Dr. Jens Som    Past Surgical History  Procedure Laterality Date  . Breast surgery      fibroid tumors  . Dilation and curettage of uterus      Family History  Problem Relation Age of Onset  . Coronary artery disease Mother   . Heart disease Mother   . Stomach cancer Father   . Colon cancer Neg Hx   . Colon polyps Mother   . Diabetes Mother   . Kidney  disease Neg Hx   . Esophageal cancer Neg Hx   . Gallbladder disease Neg Hx     Social History   Social History  . Marital Status: Married    Spouse Name: N/A  . Number of Children: 2  . Years of Education: N/A   Occupational History  . Housewife    Social History Main Topics  . Smoking status: Former Smoker -- 0.50 packs/day for 20 years    Types: Cigarettes    Quit date: 01/20/1988  . Smokeless tobacco: Never Used     Comment: quit remotely  . Alcohol Use: 0.0 oz/week    0 Standard drinks or equivalent per week     Comment: very rare and a little wine  . Drug Use: No  . Sexual Activity: Yes   Other Topics Concern  . None   Social History Narrative   Work or School: retired - used to work in [place] Situation: lives with husband      Spiritual Beliefs: Christian      Lifestyle: walks and goes to the gym - diet is healthy              Current outpatient prescriptions:  .  budesonide-formoterol (SYMBICORT) 160-4.5 MCG/ACT inhaler, Inhale 2 puffs into the lungs 2 (two) times daily., Disp: , Rfl:  .  desvenlafaxine (PRISTIQ) 100 MG 24 hr tablet, Take 0.5 tablet by mouth every day, Disp: , Rfl:  .  losartan-hydrochlorothiazide (HYZAAR) 50-12.5 MG tablet, Take 1.5 tablets by mouth daily., Disp: 180 tablet, Rfl: 3 .  montelukast (SINGULAIR) 10 MG tablet, Take 10 mg by mouth at bedtime., Disp: , Rfl:  .  Probiotic Product (ALIGN) 4 MG CAPS, Take by mouth., Disp: , Rfl:  .  SYNTHROID 137 MCG tablet, TAKE 1 TABLET (137 MCG TOTAL) BY MOUTH DAILY BEFORE BREAKFAST., Disp: 90 tablet, Rfl: 3 .  traZODone (DESYREL) 50 MG tablet, Take 50 mg by mouth as needed. , Disp: , Rfl:  .  [DISCONTINUED] Amlodipine-Valsartan-HCTZ 5-160-12.5 MG TABS, Take 1 tablet by mouth daily., Disp: 90 tablet, Rfl: 3 .  [DISCONTINUED] Rivaroxaban 20 MG TABS, Take 20 mg by mouth daily., Disp: 30 tablet, Rfl: 6  EXAM:  Filed Vitals:   03/04/15 0917  BP: 128/82  Pulse: 74  Temp:  98.5 F (36.9 C)    Body mass index is 27.19 kg/(m^2).  GENERAL: vitals reviewed and listed above, alert, oriented, appears well hydrated and in no acute distress  HEENT: atraumatic, conjunttiva clear, no obvious abnormalities on inspection of external nose and ears  NECK: no obvious masses on inspection  LUNGS: clear to auscultation bilaterally, no wheezes, rales or rhonchi, good air movement  CV: HRRR, no peripheral edema  MS: moves all extremities without noticeable abnormality  PSYCH: pleasant and cooperative, no obvious depression or anxiety  ASSESSMENT AND PLAN:  Discussed the following assessment and plan:  Gastroesophageal reflux disease, esophagitis presence not specified -stop PPI -lifestyle changes - smaller meals, foods, ect -prn tumsor zantac -follow up as needed  Atypical chest pain -resolved, she still plans to see her cardiologist  Essential hypertension -cont current tx  Hypothyroidism, unspecified hypothyroidism type -refilled meds  Hyperlipemia -she is working on lifestyle and will monitor closely -she also is seeing her cardiologist  -Patient advised to return or notify a doctor immediately if symptoms worsen or persist or new concerns arise.  Patient Instructions  BEFORE YOU LEAVE: -schedule follow up in 3 months - come fasting and we will plan to recheck cholesterol  We recommend the following healthy lifestyle measures: - eat a healthy whole foods diet consisting of regular small meals composed of vegetables, fruits, beans, nuts, seeds, healthy meats such as white chicken and fish and whole grains.  - avoid sweets, white starchy foods, fried foods, fast food, processed foods, sodas, red meet and other fattening foods.  - get a least 150-300 minutes of aerobic exercise per week.   Use ranitidine (zantac) or tums if needed for occasional acid - stop the prilosec for now  Food Choices for Gastroesophageal Reflux Disease, Adult When you have  gastroesophageal reflux disease (GERD), the foods you eat and your eating habits are very important. Choosing the right foods can help ease the discomfort of GERD. WHAT GENERAL GUIDELINES DO I NEED TO FOLLOW?  Choose fruits, vegetables, whole grains, low-fat dairy products, and low-fat meat, fish, and poultry.  Limit fats such as oils, salad dressings, butter, nuts, and avocado.  Keep a food diary to identify foods that cause symptoms.  Avoid foods that cause reflux. These may be different for different people.  Eat frequent small meals instead of three large meals each day.  Eat your meals slowly, in a relaxed setting.  Limit fried foods.  Cook foods using methods other than frying.  Avoid drinking alcohol.  Avoid drinking large amounts of liquids with your meals.  Avoid bending over or lying down until 2-3 hours after eating. WHAT FOODS ARE NOT RECOMMENDED? The following are some foods and drinks that may worsen your symptoms: Vegetables Tomatoes. Tomato juice. Tomato and spaghetti sauce. Chili peppers. Onion and garlic. Horseradish. Fruits Oranges, grapefruit, and lemon (fruit and juice). Meats High-fat meats, fish, and poultry. This includes hot dogs, ribs, ham, sausage, salami, and bacon. Dairy Whole milk and chocolate milk. Sour cream. Cream. Butter. Ice cream. Cream cheese.  Beverages Coffee and tea, with or without caffeine. Carbonated beverages or energy drinks. Condiments Hot sauce. Barbecue sauce.  Sweets/Desserts Chocolate and cocoa. Donuts. Peppermint and spearmint. Fats and Oils High-fat foods, including [place] fries and potato chips. Other Vinegar. Chewing Gum. Strong spices, such as black pepper, white pepper, red pepper, cayenne, curry powder, cloves, ginger, and chili powder. The items listed above may not be a complete list of foods and beverages to avoid. Contact your dietitian for more information.   This information is not intended to replace advice  given to you by your health care provider. Make sure you discuss any questions you have with your health care provider.   Document Released: 01/05/2005 Document Revised: 01/26/2014 Document Reviewed: 11/09/2012 Elsevier Interactive Patient Education 2016 [place], [place]

## 2015-03-04 NOTE — Progress Notes (Signed)
Pre visit review using our clinic review tool, if applicable. No additional management support is needed unless otherwise documented below in the visit note. 

## 2015-03-04 NOTE — Patient Instructions (Signed)
BEFORE YOU LEAVE: -schedule follow up in 3 months - come fasting and we will plan to recheck cholesterol  We recommend the following healthy lifestyle measures: - eat a healthy whole foods diet consisting of regular small meals composed of vegetables, fruits, beans, nuts, seeds, healthy meats such as white chicken and fish and whole grains.  - avoid sweets, white starchy foods, fried foods, fast food, processed foods, sodas, red meet and other fattening foods.  - get a least 150-300 minutes of aerobic exercise per week.   Use ranitidine (zantac) or tums if needed for occasional acid - stop the prilosec for now  Food Choices for Gastroesophageal Reflux Disease, Adult When you have gastroesophageal reflux disease (GERD), the foods you eat and your eating habits are very important. Choosing the right foods can help ease the discomfort of GERD. WHAT GENERAL GUIDELINES DO I NEED TO FOLLOW?  Choose fruits, vegetables, whole grains, low-fat dairy products, and low-fat meat, fish, and poultry.  Limit fats such as oils, salad dressings, butter, nuts, and avocado.  Keep a food diary to identify foods that cause symptoms.  Avoid foods that cause reflux. These may be different for different people.  Eat frequent small meals instead of three large meals each day.  Eat your meals slowly, in a relaxed setting.  Limit fried foods.  Cook foods using methods other than frying.  Avoid drinking alcohol.  Avoid drinking large amounts of liquids with your meals.  Avoid bending over or lying down until 2-3 hours after eating. WHAT FOODS ARE NOT RECOMMENDED? The following are some foods and drinks that may worsen your symptoms: Vegetables Tomatoes. Tomato juice. Tomato and spaghetti sauce. Chili peppers. Onion and garlic. Horseradish. Fruits Oranges, grapefruit, and lemon (fruit and juice). Meats High-fat meats, fish, and poultry. This includes hot dogs, ribs, ham, sausage, salami, and  bacon. Dairy Whole milk and chocolate milk. Sour cream. Cream. Butter. Ice cream. Cream cheese.  Beverages Coffee and tea, with or without caffeine. Carbonated beverages or energy drinks. Condiments Hot sauce. Barbecue sauce.  Sweets/Desserts Chocolate and cocoa. Donuts. Peppermint and spearmint. Fats and Oils High-fat foods, including [place] fries and potato chips. Other Vinegar. Chewing Gum. Strong spices, such as black pepper, white pepper, red pepper, cayenne, curry powder, cloves, ginger, and chili powder. The items listed above may not be a complete list of foods and beverages to avoid. Contact your dietitian for more information.   This information is not intended to replace advice given to you by your health care provider. Make sure you discuss any questions you have with your health care provider.   Document Released: 01/05/2005 Document Revised: 01/26/2014 Document Reviewed: 11/09/2012 Elsevier Interactive Patient Education [employer].

## 2015-03-05 LAB — HM PAP SMEAR

## 2015-03-11 ENCOUNTER — Ambulatory Visit (INDEPENDENT_AMBULATORY_CARE_PROVIDER_SITE_OTHER): Payer: Medicare Other | Admitting: Internal Medicine

## 2015-03-11 ENCOUNTER — Encounter: Payer: Self-pay | Admitting: Internal Medicine

## 2015-03-11 VITALS — BP 128/72 | HR 75 | Ht 69.0 in | Wt 183.0 lb

## 2015-03-11 DIAGNOSIS — I493 Ventricular premature depolarization: Secondary | ICD-10-CM | POA: Diagnosis not present

## 2015-03-11 MED ORDER — LOSARTAN POTASSIUM 50 MG PO TABS: 75.0000 mg | ORAL_TABLET | Freq: Every day | ORAL | Status: DC

## 2015-03-11 MED ORDER — MAGNESIUM OXIDE 400 MG PO TABS: 400.0000 mg | ORAL_TABLET | Freq: Every day | ORAL | Status: DC

## 2015-03-11 NOTE — Patient Instructions (Signed)
Medication Instructions: 1) Stop losartan-hctz 2) Start losartan 50 mg take 1 & 1/2 tablets (75 mg) by mouth once daily 3) You are being given a prescription for magnesium oxide 400 mg one tablet by mouth once daily  Labwork: - Your physician recommends that you return for lab work in: 10 days- BMP  Procedures/Testing: - none  Follow-Up: - Your physician wants you to follow-up in: 4 months with Dr. Caryl Comes. You will receive a reminder letter in the mail two months in advance. If you don't receive a letter, please call our office to schedule the follow-up appointment.  Any Additional Special Instructions Will Be Listed Below (If Applicable).     If you need a refill on your cardiac medications before your next appointment, please call your pharmacy.

## 2015-03-11 NOTE — Progress Notes (Signed)
Patient Care Team: [name], DO as PCP - General (Family Medicine)   HPI  Erica Marquez is a 65 y.o. female seen in followup for RVOT PVCs and Hypertension   She also carries a history of atrial fibrillation that I had given her associated with popcorn sensations. After reviewing all that i could find with her name on it i could find nothing with atrial fibrillation-- i dont know how I made the error    Her PVCs were much improved but they have acted up in the context of her having atypical chest pain which is responded to PPIs and her potassium having been recorded as low as 3.2 range.   Her reflux pain is now much better  Her lipids are quite anomalous with a change in her LDL over the last year from 119--194   Past Medical History  Diagnosis Date  . Arthritis   . Depression   . Allergy   . Asthma   . GERD (gastroesophageal reflux disease)     hiatal hernia  . Hypertension   . Hyperlipidemia   . Fibroids   . Diverticulosis of colon   . Blunt head trauma   . Premature ventricular contractions   . IBS (irritable bowel syndrome)   . Edema 08/12/2009    Qualifier: Diagnosis of  By: Arnoldo Morale MD, Balinda Quails   . Pelvic pain in female, chronic 10/24/2013  . BACK PAIN, LUMBAR, WITH RADICULOPATHY 09/12/2007    Qualifier: Diagnosis of  By: Arnoldo Morale MD, Balinda Quails   . Hypothyroidism 03/26/2014  . Schatzki's ring     on EGD 2016  . Tubular adenoma of colon 03/2014  . Chest pain     s/p extensive nag cardiac eval with cath, stress test and sees Dr. Jens Som    Past Surgical History  Procedure Laterality Date  . Breast surgery      fibroid tumors  . Dilation and curettage of uterus      Current Outpatient Prescriptions  Medication Sig Dispense Refill  . budesonide-formoterol (SYMBICORT) 160-4.5 MCG/ACT inhaler Inhale 2 puffs into the lungs 2 (two) times daily.    Marland Kitchen desvenlafaxine (PRISTIQ) 100 MG 24 hr tablet Take 0.5 tablet by mouth every day    . losartan-hydrochlorothiazide (HYZAAR)  50-12.5 MG tablet Take 1.5 tablets by mouth daily. 180 tablet 3  . montelukast (SINGULAIR) 10 MG tablet Take 10 mg by mouth at bedtime.    . Probiotic Product (ALIGN) 4 MG CAPS Take by mouth.    . SYNTHROID 137 MCG tablet TAKE 1 TABLET (137 MCG TOTAL) BY MOUTH DAILY BEFORE BREAKFAST. 90 tablet 3  . traZODone (DESYREL) 50 MG tablet Take 50 mg by mouth as needed.     . [DISCONTINUED] Amlodipine-Valsartan-HCTZ 5-160-12.5 MG TABS Take 1 tablet by mouth daily. 90 tablet 3  . [DISCONTINUED] Rivaroxaban 20 MG TABS Take 20 mg by mouth daily. 30 tablet 6   No current facility-administered medications for this visit.    No Known Allergies  Review of Systems negative except from HPI and PMH  Physical Exam BP 128/72 mmHg  Pulse 75  Ht 5\' 9"  (1.753 m)  Wt 183 lb (83.008 kg)  BMI 27.01 kg/m2  Repeat blood pressure of 165   Well developed and nourished in no acute distress HENT normal Neck supple with JVP-flat Clear Regular rate and rhythm, no murmurs or gallops Abd-soft with active BS No Clubbing cyanosis edema Skin-warm and dry A & Oriented  Grossly normal sensory and motor  function   ECG demonstrates sinus rhythm at 65 Intervals 14/08/40 Axis is 46  Assessment and  Plan  RVOT-PVCs  Hypertension  Hyperlipidemia  Atypical chest pain-GE reflux disease  Hypokalemia   We will plan to change her antihypertensive from Hyzaar--losartan without a diuretic. We will reassess her potassium level about 10 days she is encouraged to eat potassium rich foods.  I've also given her prescription for magnesium oxide to me: She will hold off on getting it filled until she sees how her PVCs are doing following normalization of her potassium and the continued quiescent of her GE reflux  Her change in her LDL is striking. It occurs concomitant with resumption of hot flashes suggesting a hormonal mechanism. I suggested that she follow-up with her gynecologist about that. She says they will be  following along.

## 2015-03-15 ENCOUNTER — Encounter: Payer: Self-pay | Admitting: Family Medicine

## 2015-03-15 NOTE — Addendum Note (Signed)
Addended by: Freada Bergeron on: 03/15/2015 10:27 AM   Modules accepted: Orders

## 2015-03-20 HISTORY — PX: KNEE CARTILAGE SURGERY: SHX688

## 2015-03-21 ENCOUNTER — Other Ambulatory Visit (INDEPENDENT_AMBULATORY_CARE_PROVIDER_SITE_OTHER): Payer: Medicare Other | Admitting: *Deleted

## 2015-03-21 DIAGNOSIS — I493 Ventricular premature depolarization: Secondary | ICD-10-CM | POA: Diagnosis not present

## 2015-03-21 LAB — BASIC METABOLIC PANEL
BUN: 12 mg/dL (ref 7–25)
CALCIUM: 9.5 mg/dL (ref 8.6–10.4)
CO2: 28 mmol/L (ref 20–31)
Chloride: 105 mmol/L (ref 98–110)
Creat: 0.78 mg/dL (ref 0.50–0.99)
GLUCOSE: 87 mg/dL (ref 65–99)
POTASSIUM: 4.3 mmol/L (ref 3.5–5.3)
Sodium: 140 mmol/L (ref 135–146)

## 2015-03-21 NOTE — Addendum Note (Signed)
Addended by: Eulis Foster on: 03/21/2015 09:05 AM   Modules accepted: Orders

## 2015-03-28 DIAGNOSIS — D259 Leiomyoma of uterus, unspecified: Secondary | ICD-10-CM | POA: Diagnosis not present

## 2015-03-28 DIAGNOSIS — R102 Pelvic and perineal pain: Secondary | ICD-10-CM | POA: Diagnosis not present

## 2015-04-02 DIAGNOSIS — S83241A Other tear of medial meniscus, current injury, right knee, initial encounter: Secondary | ICD-10-CM | POA: Diagnosis not present

## 2015-04-02 DIAGNOSIS — M2241 Chondromalacia patellae, right knee: Secondary | ICD-10-CM | POA: Diagnosis not present

## 2015-04-02 DIAGNOSIS — Z87891 Personal history of nicotine dependence: Secondary | ICD-10-CM | POA: Diagnosis not present

## 2015-04-02 DIAGNOSIS — Z79899 Other long term (current) drug therapy: Secondary | ICD-10-CM | POA: Diagnosis not present

## 2015-04-02 DIAGNOSIS — M23351 Other meniscus derangements, posterior horn of lateral meniscus, right knee: Secondary | ICD-10-CM | POA: Diagnosis not present

## 2015-04-02 DIAGNOSIS — Z8249 Family history of ischemic heart disease and other diseases of the circulatory system: Secondary | ICD-10-CM | POA: Diagnosis not present

## 2015-04-02 DIAGNOSIS — S83281A Other tear of lateral meniscus, current injury, right knee, initial encounter: Secondary | ICD-10-CM | POA: Diagnosis not present

## 2015-04-02 DIAGNOSIS — I10 Essential (primary) hypertension: Secondary | ICD-10-CM | POA: Diagnosis not present

## 2015-04-02 DIAGNOSIS — Z833 Family history of diabetes mellitus: Secondary | ICD-10-CM | POA: Diagnosis not present

## 2015-04-02 DIAGNOSIS — M23331 Other meniscus derangements, other medial meniscus, right knee: Secondary | ICD-10-CM | POA: Diagnosis not present

## 2015-04-02 DIAGNOSIS — G8918 Other acute postprocedural pain: Secondary | ICD-10-CM | POA: Diagnosis not present

## 2015-04-02 DIAGNOSIS — M23361 Other meniscus derangements, other lateral meniscus, right knee: Secondary | ICD-10-CM | POA: Diagnosis not present

## 2015-04-23 ENCOUNTER — Other Ambulatory Visit: Payer: Self-pay | Admitting: Family Medicine

## 2015-05-07 DIAGNOSIS — Z4789 Encounter for other orthopedic aftercare: Secondary | ICD-10-CM | POA: Diagnosis not present

## 2015-05-07 DIAGNOSIS — S83241A Other tear of medial meniscus, current injury, right knee, initial encounter: Secondary | ICD-10-CM | POA: Diagnosis not present

## 2015-05-09 ENCOUNTER — Encounter: Payer: Self-pay | Admitting: Family Medicine

## 2015-05-15 DIAGNOSIS — M25561 Pain in right knee: Secondary | ICD-10-CM | POA: Diagnosis not present

## 2015-05-15 DIAGNOSIS — M799 Soft tissue disorder, unspecified: Secondary | ICD-10-CM | POA: Diagnosis not present

## 2015-05-15 DIAGNOSIS — R29898 Other symptoms and signs involving the musculoskeletal system: Secondary | ICD-10-CM | POA: Diagnosis not present

## 2015-05-15 DIAGNOSIS — R262 Difficulty in walking, not elsewhere classified: Secondary | ICD-10-CM | POA: Diagnosis not present

## 2015-05-29 DIAGNOSIS — R29898 Other symptoms and signs involving the musculoskeletal system: Secondary | ICD-10-CM | POA: Diagnosis not present

## 2015-05-29 DIAGNOSIS — M799 Soft tissue disorder, unspecified: Secondary | ICD-10-CM | POA: Diagnosis not present

## 2015-05-29 DIAGNOSIS — M25561 Pain in right knee: Secondary | ICD-10-CM | POA: Diagnosis not present

## 2015-05-29 DIAGNOSIS — R262 Difficulty in walking, not elsewhere classified: Secondary | ICD-10-CM | POA: Diagnosis not present

## 2015-06-04 ENCOUNTER — Ambulatory Visit: Payer: Medicare Other | Admitting: Family Medicine

## 2015-06-05 DIAGNOSIS — R262 Difficulty in walking, not elsewhere classified: Secondary | ICD-10-CM | POA: Diagnosis not present

## 2015-06-05 DIAGNOSIS — R29898 Other symptoms and signs involving the musculoskeletal system: Secondary | ICD-10-CM | POA: Diagnosis not present

## 2015-06-05 DIAGNOSIS — M25561 Pain in right knee: Secondary | ICD-10-CM | POA: Diagnosis not present

## 2015-06-05 DIAGNOSIS — M799 Soft tissue disorder, unspecified: Secondary | ICD-10-CM | POA: Diagnosis not present

## 2015-06-07 DIAGNOSIS — J3089 Other allergic rhinitis: Secondary | ICD-10-CM | POA: Diagnosis not present

## 2015-06-07 DIAGNOSIS — J301 Allergic rhinitis due to pollen: Secondary | ICD-10-CM | POA: Diagnosis not present

## 2015-06-07 DIAGNOSIS — H1045 Other chronic allergic conjunctivitis: Secondary | ICD-10-CM | POA: Diagnosis not present

## 2015-06-07 DIAGNOSIS — J4530 Mild persistent asthma, uncomplicated: Secondary | ICD-10-CM | POA: Diagnosis not present

## 2015-06-11 DIAGNOSIS — R29898 Other symptoms and signs involving the musculoskeletal system: Secondary | ICD-10-CM | POA: Diagnosis not present

## 2015-06-11 DIAGNOSIS — M25561 Pain in right knee: Secondary | ICD-10-CM | POA: Diagnosis not present

## 2015-06-11 DIAGNOSIS — R262 Difficulty in walking, not elsewhere classified: Secondary | ICD-10-CM | POA: Diagnosis not present

## 2015-06-11 DIAGNOSIS — M799 Soft tissue disorder, unspecified: Secondary | ICD-10-CM | POA: Diagnosis not present

## 2015-06-13 DIAGNOSIS — F329 Major depressive disorder, single episode, unspecified: Secondary | ICD-10-CM | POA: Diagnosis not present

## 2015-06-21 DIAGNOSIS — M799 Soft tissue disorder, unspecified: Secondary | ICD-10-CM | POA: Diagnosis not present

## 2015-06-21 DIAGNOSIS — R262 Difficulty in walking, not elsewhere classified: Secondary | ICD-10-CM | POA: Diagnosis not present

## 2015-06-21 DIAGNOSIS — M25561 Pain in right knee: Secondary | ICD-10-CM | POA: Diagnosis not present

## 2015-06-21 DIAGNOSIS — R29898 Other symptoms and signs involving the musculoskeletal system: Secondary | ICD-10-CM | POA: Diagnosis not present

## 2015-07-04 DIAGNOSIS — R262 Difficulty in walking, not elsewhere classified: Secondary | ICD-10-CM | POA: Diagnosis not present

## 2015-07-04 DIAGNOSIS — M799 Soft tissue disorder, unspecified: Secondary | ICD-10-CM | POA: Diagnosis not present

## 2015-07-04 DIAGNOSIS — M25561 Pain in right knee: Secondary | ICD-10-CM | POA: Diagnosis not present

## 2015-07-04 DIAGNOSIS — R29898 Other symptoms and signs involving the musculoskeletal system: Secondary | ICD-10-CM | POA: Diagnosis not present

## 2015-07-09 ENCOUNTER — Ambulatory Visit (INDEPENDENT_AMBULATORY_CARE_PROVIDER_SITE_OTHER): Payer: Medicare Other | Admitting: Internal Medicine

## 2015-07-09 VITALS — BP 130/80 | HR 58 | Ht 69.0 in | Wt 179.8 lb

## 2015-07-09 DIAGNOSIS — I10 Essential (primary) hypertension: Secondary | ICD-10-CM | POA: Diagnosis not present

## 2015-07-09 DIAGNOSIS — R079 Chest pain, unspecified: Secondary | ICD-10-CM | POA: Diagnosis not present

## 2015-07-09 DIAGNOSIS — I493 Ventricular premature depolarization: Secondary | ICD-10-CM | POA: Diagnosis not present

## 2015-07-09 DIAGNOSIS — D485 Neoplasm of uncertain behavior of skin: Secondary | ICD-10-CM | POA: Diagnosis not present

## 2015-07-09 MED ORDER — MAGNESIUM OXIDE 400 MG PO TABS: 400.0000 mg | ORAL_TABLET | Freq: Every day | ORAL | Status: AC

## 2015-07-09 NOTE — Progress Notes (Signed)
Patient Care Team: [name], DO as PCP - General (Family Medicine)   HPI  Erica Marquez is a 65 y.o. female seen in followup for RVOT PVCs and Hypertension   She also carries a history of atrial fibrillation that I had given her associated with popcorn sensations. After reviewing all that i could find with her name on it i could find nothing with atrial fibrillation-- i dont know how I made the error    Her PVCs were much improved but they have acted up in the context of her having atypical chest pain which is responded to PPIs and her potassium having been recorded as low as 3.2 range. Following the discontinuation of her diuretic and reassessment of her potassium 4.33/17 \\  Her reflux pain is now much better  Her PVC are much better on magnesium.  She's having more exertional chest discomfort and dypsnea following her recent knee surgery. She has some swelling; venous Dopplers were negative   Her lipids are quite anomalous with a change in her LDL over the last year from 119--194   Past Medical History  Diagnosis Date  . Arthritis   . Depression   . Allergy   . Asthma   . GERD (gastroesophageal reflux disease)     hiatal hernia  . Hypertension   . Hyperlipidemia   . Fibroids   . Diverticulosis of colon   . Blunt head trauma   . Premature ventricular contractions   . IBS (irritable bowel syndrome)   . Edema 08/12/2009    Qualifier: Diagnosis of  By: Arnoldo Morale MD, Balinda Quails   . Pelvic pain in female, chronic 10/24/2013  . BACK PAIN, LUMBAR, WITH RADICULOPATHY 09/12/2007    Qualifier: Diagnosis of  By: Arnoldo Morale MD, Balinda Quails   . Hypothyroidism 03/26/2014  . Schatzki's ring     on EGD 2016  . Tubular adenoma of colon 03/2014  . Chest pain     s/p extensive nag cardiac eval with cath, stress test and sees Dr. Jens Som    Past Surgical History  Procedure Laterality Date  . Breast surgery      fibroid tumors  . Dilation and curettage of uterus      Current Outpatient  Prescriptions  Medication Sig Dispense Refill  . budesonide-formoterol (SYMBICORT) 160-4.5 MCG/ACT inhaler Inhale 2 puffs into the lungs 2 (two) times daily.    Marland Kitchen desvenlafaxine (PRISTIQ) 100 MG 24 hr tablet Take 0.5 tablet by mouth every day    . levocetirizine (XYZAL) 5 MG tablet Take 1 tablet by mouth daily.    Marland Kitchen losartan (COZAAR) 50 MG tablet Take 1.5 tablets (75 mg total) by mouth daily. 135 tablet 3  . magnesium oxide (MAG-OX) 400 MG tablet Take 1 tablet (400 mg total) by mouth daily. 90 tablet 3  . montelukast (SINGULAIR) 10 MG tablet Take 10 mg by mouth at bedtime.    . Olopatadine HCl (PATANASE) 0.6 % SOLN Place 1 spray into the nose daily as needed. For allergies    . Probiotic Product (ALIGN) 4 MG CAPS Take by mouth.    . SYNTHROID 137 MCG tablet TAKE 1 TABLET (137 MCG TOTAL) BY MOUTH DAILY BEFORE BREAKFAST. 90 tablet 3  . traZODone (DESYREL) 50 MG tablet Take 50 mg by mouth as needed for sleep.     . [DISCONTINUED] Amlodipine-Valsartan-HCTZ 5-160-12.5 MG TABS Take 1 tablet by mouth daily. 90 tablet 3  . [DISCONTINUED] Rivaroxaban 20 MG TABS Take 20 mg by mouth daily.  30 tablet 6   No current facility-administered medications for this visit.    No Known Allergies  Review of Systems negative except from HPI and PMH  Physical Exam BP 130/80 mmHg  Pulse 58  Ht 5\' 9"  (1.753 m)  Wt 179 lb 12.8 oz (81.557 kg)  BMI 26.54 kg/m2  SpO2 98%  Repeat blood pressure of 165   Well developed and nourished in no acute distress HENT normal Neck supple with JVP-flat Clear Regular rate and rhythm, no murmurs or gallops Abd-soft with active BS No Clubbing cyanosis edema Skin-warm and dry A & Oriented  Grossly normal sensory and motor function   ECG demonstrates sinus rhythm at 61 16/08/Axis is 46   Assessment and  Plan  RVOT-PVCs  Hypertension  Hyperlipidemia  Atypical chest pain-GE reflux disease  Worsening shortness of breath with exertion   She's concerned about  her shortness of breath following knee surgery. There is a little bit of swelling; she has however had a negative postoperative Doppler. We will undertake a Myoview scan. She would like to do with exercise although I'm not sure her knee will allow it; if not we will switch to [employer]

## 2015-07-09 NOTE — Patient Instructions (Signed)
Medication Instructions: - Your physician recommends that you continue on your current medications as directed. Please refer to the Current Medication list given to you today.  Labwork: - none  Procedures/Testing: - Your physician has requested that you have an exercise stress myoview. For further information please visit [email or web address]. Please follow instruction sheet, as given.  Follow-Up: - Your physician wants you to follow-up in: 1 year with Dr. Caryl Comes. You will receive a reminder letter in the mail two months in advance. If you don't receive a letter, please call our office to schedule the follow-up appointment.  Any Additional Special Instructions Will Be Listed Below (If Applicable).     If you need a refill on your cardiac medications before your next appointment, please call your pharmacy.

## 2015-07-10 DIAGNOSIS — B079 Viral wart, unspecified: Secondary | ICD-10-CM | POA: Diagnosis not present

## 2015-07-11 DIAGNOSIS — R262 Difficulty in walking, not elsewhere classified: Secondary | ICD-10-CM | POA: Diagnosis not present

## 2015-07-11 DIAGNOSIS — M25561 Pain in right knee: Secondary | ICD-10-CM | POA: Diagnosis not present

## 2015-07-11 DIAGNOSIS — M799 Soft tissue disorder, unspecified: Secondary | ICD-10-CM | POA: Diagnosis not present

## 2015-07-11 DIAGNOSIS — R29898 Other symptoms and signs involving the musculoskeletal system: Secondary | ICD-10-CM | POA: Diagnosis not present

## 2015-07-16 ENCOUNTER — Telehealth (HOSPITAL_COMMUNITY): Payer: Self-pay | Admitting: *Deleted

## 2015-07-16 NOTE — Telephone Encounter (Signed)
Patient given detailed instructions per Myocardial Perfusion Study Information Sheet for the test on 07/19/15 Patient notified to arrive 15 minutes early and that it is imperative to arrive on time for appointment to keep from having the test rescheduled.  If you need to cancel or reschedule your appointment, please call the office within 24 hours of your appointment. Failure to do so may result in a cancellation of your appointment, and a $50 no show fee. Patient verbalized understanding. Hubbard Robinson, RN

## 2015-07-18 DIAGNOSIS — M25561 Pain in right knee: Secondary | ICD-10-CM | POA: Diagnosis not present

## 2015-07-18 DIAGNOSIS — R29898 Other symptoms and signs involving the musculoskeletal system: Secondary | ICD-10-CM | POA: Diagnosis not present

## 2015-07-18 DIAGNOSIS — R262 Difficulty in walking, not elsewhere classified: Secondary | ICD-10-CM | POA: Diagnosis not present

## 2015-07-18 DIAGNOSIS — M799 Soft tissue disorder, unspecified: Secondary | ICD-10-CM | POA: Diagnosis not present

## 2015-07-19 ENCOUNTER — Ambulatory Visit (HOSPITAL_COMMUNITY): Payer: Medicare Other | Attending: Cardiology

## 2015-07-19 DIAGNOSIS — I10 Essential (primary) hypertension: Secondary | ICD-10-CM | POA: Diagnosis not present

## 2015-07-19 DIAGNOSIS — R079 Chest pain, unspecified: Secondary | ICD-10-CM | POA: Diagnosis not present

## 2015-07-19 LAB — MYOCARDIAL PERFUSION IMAGING
CHL CUP MPHR: 155 {beats}/min
CHL CUP NUCLEAR SDS: 0
CHL CUP NUCLEAR SRS: 12
CHL CUP RESTING HR STRESS: 60 {beats}/min
CSEPEDS: 0 s
CSEPEW: 10.1 METS
CSEPHR: 100 %
Exercise duration (min): 8 min
LV sys vol: 26 mL
LVDIAVOL: 67 mL (ref 46–106)
Peak HR: 155 {beats}/min
RATE: 0.36
SSS: 12
TID: 0.92

## 2015-07-19 MED ORDER — TECHNETIUM TC 99M TETROFOSMIN IV KIT
10.4000 | PACK | Freq: Once | INTRAVENOUS | Status: AC | PRN
  Administered 2015-07-19: 10 via INTRAVENOUS
  Filled 2015-07-19: qty 10

## 2015-07-19 MED ORDER — TECHNETIUM TC 99M TETROFOSMIN IV KIT
32.6000 | PACK | Freq: Once | INTRAVENOUS | Status: AC | PRN
  Administered 2015-07-19: 33 via INTRAVENOUS
  Filled 2015-07-19: qty 33

## 2015-07-22 ENCOUNTER — Telehealth: Payer: Self-pay | Admitting: Internal Medicine

## 2015-07-22 NOTE — Telephone Encounter (Signed)
The patient is aware of her results.  

## 2015-07-22 NOTE — Telephone Encounter (Signed)
Follow-up      The pt is calling to speak with a nurse concerning her stress test results

## 2015-07-31 DIAGNOSIS — M799 Soft tissue disorder, unspecified: Secondary | ICD-10-CM | POA: Diagnosis not present

## 2015-07-31 DIAGNOSIS — R262 Difficulty in walking, not elsewhere classified: Secondary | ICD-10-CM | POA: Diagnosis not present

## 2015-07-31 DIAGNOSIS — M25561 Pain in right knee: Secondary | ICD-10-CM | POA: Diagnosis not present

## 2015-07-31 DIAGNOSIS — R29898 Other symptoms and signs involving the musculoskeletal system: Secondary | ICD-10-CM | POA: Diagnosis not present

## 2015-08-01 ENCOUNTER — Encounter: Payer: Self-pay | Admitting: Family Medicine

## 2015-08-01 ENCOUNTER — Ambulatory Visit (INDEPENDENT_AMBULATORY_CARE_PROVIDER_SITE_OTHER): Payer: Medicare Other | Admitting: Family Medicine

## 2015-08-01 VITALS — BP 118/80 | HR 71 | Temp 97.9°F | Ht 69.0 in | Wt 176.8 lb

## 2015-08-01 DIAGNOSIS — K588 Other irritable bowel syndrome: Secondary | ICD-10-CM | POA: Diagnosis not present

## 2015-08-01 DIAGNOSIS — R1030 Lower abdominal pain, unspecified: Secondary | ICD-10-CM | POA: Diagnosis not present

## 2015-08-01 DIAGNOSIS — J309 Allergic rhinitis, unspecified: Secondary | ICD-10-CM

## 2015-08-01 DIAGNOSIS — J4520 Mild intermittent asthma, uncomplicated: Secondary | ICD-10-CM

## 2015-08-01 DIAGNOSIS — I10 Essential (primary) hypertension: Secondary | ICD-10-CM

## 2015-08-01 DIAGNOSIS — E039 Hypothyroidism, unspecified: Secondary | ICD-10-CM | POA: Diagnosis not present

## 2015-08-01 DIAGNOSIS — K219 Gastro-esophageal reflux disease without esophagitis: Secondary | ICD-10-CM

## 2015-08-01 LAB — CBC WITH DIFFERENTIAL/PLATELET
BASOS PCT: 0.9 % (ref 0.0–3.0)
Basophils Absolute: 0.1 10*3/uL (ref 0.0–0.1)
EOS ABS: 0.1 10*3/uL (ref 0.0–0.7)
Eosinophils Relative: 2.2 % (ref 0.0–5.0)
HEMATOCRIT: 42.6 % (ref 36.0–46.0)
Hemoglobin: 14.1 g/dL (ref 12.0–15.0)
LYMPHS ABS: 2.1 10*3/uL (ref 0.7–4.0)
Lymphocytes Relative: 38.7 % (ref 12.0–46.0)
MCHC: 33.1 g/dL (ref 30.0–36.0)
MCV: 85.4 fl (ref 78.0–100.0)
MONO ABS: 0.5 10*3/uL (ref 0.1–1.0)
Monocytes Relative: 8.8 % (ref 3.0–12.0)
NEUTROS ABS: 2.6 10*3/uL (ref 1.4–7.7)
NEUTROS PCT: 49.4 % (ref 43.0–77.0)
PLATELETS: 212 10*3/uL (ref 150.0–400.0)
RBC: 4.99 Mil/uL (ref 3.87–5.11)
RDW: 14.8 % (ref 11.5–15.5)
WBC: 5.3 10*3/uL (ref 4.0–10.5)

## 2015-08-01 LAB — URINALYSIS, ROUTINE W REFLEX MICROSCOPIC
Bilirubin Urine: NEGATIVE
Hgb urine dipstick: NEGATIVE
Ketones, ur: NEGATIVE
Leukocytes, UA: NEGATIVE
Nitrite: NEGATIVE
PH: 6 (ref 5.0–8.0)
SPECIFIC GRAVITY, URINE: 1.01 (ref 1.000–1.030)
Total Protein, Urine: NEGATIVE
Urine Glucose: NEGATIVE
Urobilinogen, UA: 0.2 (ref 0.0–1.0)

## 2015-08-01 LAB — LIPID PANEL
CHOLESTEROL: 281 mg/dL — AB (ref 0–200)
HDL: 53.1 mg/dL (ref >39.00)
LDL CALC: 195 mg/dL — AB (ref 0–99)
NonHDL: 227.77
TRIGLYCERIDES: 162 mg/dL — AB (ref 0.0–149.0)
Total CHOL/HDL Ratio: 5
VLDL: 32.4 mg/dL (ref 0.0–40.0)

## 2015-08-01 LAB — TSH: TSH: 2.14 u[IU]/mL (ref 0.35–4.50)

## 2015-08-01 NOTE — Patient Instructions (Addendum)
BEFORE YOU LEAVE: -follow up: 6 months -fodmap diet -labs  You saw GI last year for you abdominal pain and bowel issues. Please call their office to schedule follow up.  We recommend the following healthy lifestyle: 1) Small portions - eat off of salad plate instead of dinner plate 2) Eat a healthy clean diet with avoidance of (less then 1 serving per week) processed foods, sweetened drinks, white starches, red meat, fast foods and sweets and consisting of: * 5-9 servings per day of fresh or frozen fruits and vegetables (not corn or potatoes, not dried or canned) *nuts and seeds, beans *olives and olive oil *small portions of lean meats such as fish and white chicken  *small portions of whole grains 3)Get at least 150 minutes of sweaty aerobic exercise per week 4)reduce stress - counseling, meditation, relaxation to balance other aspects of your life  We have ordered labs or studies at this visit. It can take up to 1-2 weeks for results and processing. IF results require follow up or explanation, we will call you with instructions. Clinically stable results will be released to your [facility]. If you have not heard from [place] or cannot find your results in [facility] in 2 weeks please contact our office at [phone].  If you are not yet signed up for [facility], please consider signing up.

## 2015-08-01 NOTE — Progress Notes (Signed)
Pre visit review using our clinic review tool, if applicable. No additional management support is needed unless otherwise documented below in the visit note. 

## 2015-08-01 NOTE — Progress Notes (Addendum)
HPI:  Atypical CP/PVCs: -sees cardiologist - mad changed, only on losartan now -reviewed recent notes - had normal stress myoview 2017 -denies: CP, palpitations, SOB except a little deconditions  GERD/IBS: -hx hiatal hernia, tubular adenoma,  -still taking PPI -reports lower difuse abd/pelvic pain for 1.5 year with bloating/gas and loose stools -reports extensive eval with gyn with many tests and ok -worse over the last month and now improving -denies: NVD, fevers, malaise, hematochezia, melena, unintentional weight loss, dysuria, anx, depression -wants to see GI - on [facility] actually saw them before for this, has hiatal hernia, had EGD and colonosocpy -on review of chart CT showed esophagus thickened, sig stool burden - take ranitidine  Allergies/ASthma: -meds: symbicort, xyzal, singulair, patanase -reports doing well, no symptoms recently  Hypothyroid: -takes synthroid 157mg -stable - wants to recheck  HTN: -home cuff runs high last we checked -meds: - losartan - from cardiologist, issues with low potassium in the past -denies: Cp, SOB, DOE  HLD -she has been working on a cleaner diet  -no regular exercise due to knee issues, seeing ortho  ROS: See pertinent positives and negatives per HPI.  Past Medical History  Diagnosis Date  . Arthritis   . Depression   . Allergy   . Asthma   . GERD (gastroesophageal reflux disease)     hiatal hernia  . Hypertension   . Hyperlipidemia   . Fibroids   . Diverticulosis of colon   . Blunt head trauma   . Premature ventricular contractions   . IBS (irritable bowel syndrome)   . Edema 08/12/2009    Qualifier: Diagnosis of  By: JArnoldo MoraleMD, JBalinda Quails  . Pelvic pain in female, chronic 10/24/2013  . BACK PAIN, LUMBAR, WITH RADICULOPATHY 09/12/2007    Qualifier: Diagnosis of  By: JArnoldo MoraleMD, JBalinda Quails  . Hypothyroidism 03/26/2014  . Schatzki's ring     on EGD 2016  . Tubular adenoma of colon 03/2014  . Chest pain     s/p extensive nag  cardiac eval with cath, stress test and sees Dr. KJens Som   Past Surgical History  Procedure Laterality Date  . Breast surgery      fibroid tumors  . Dilation and curettage of uterus      Family History  Problem Relation Age of Onset  . Coronary artery disease Mother   . Heart disease Mother   . Stomach cancer Father   . Colon cancer Neg Hx   . Colon polyps Mother   . Diabetes Mother   . Kidney disease Neg Hx   . Esophageal cancer Neg Hx   . Gallbladder disease Neg Hx     Social History   Social History  . Marital Status: Married    Spouse Name: N/A  . Number of Children: 2  . Years of Education: N/A   Occupational History  . Housewife    Social History Main Topics  . Smoking status: Former Smoker -- 0.50 packs/day for 20 years    Types: Cigarettes    Quit date: 01/20/1988  . Smokeless tobacco: Never Used     Comment: quit remotely  . Alcohol Use: 0.0 oz/week    0 Standard drinks or equivalent per week     Comment: very rare and a little wine  . Drug Use: No  . Sexual Activity: Yes   Other Topics Concern  . 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  budesonide-formoterol (SYMBICORT) 160-4.5 MCG/ACT inhaler, Inhale 2 puffs into the lungs 2 (two) times daily., Disp: , Rfl:  .  desvenlafaxine (PRISTIQ) 100 MG 24 hr tablet, Take 0.5 tablet by mouth every day, Disp: , Rfl:  .  levocetirizine (XYZAL) 5 MG tablet, Take 1 tablet by mouth daily., Disp: , Rfl:  .  losartan (COZAAR) 50 MG tablet, Take 1.5 tablets (75 mg total) by mouth daily., Disp: 135 tablet, Rfl: 3 .  magnesium oxide (MAG-OX) 400 MG tablet, Take 1 tablet (400 mg total) by mouth daily., Disp: 90 tablet, Rfl: 3 .  montelukast (SINGULAIR) 10 MG tablet, Take 10  mg by mouth at bedtime., Disp: , Rfl:  .  Olopatadine HCl (PATANASE) 0.6 % SOLN, Place 1 spray into the nose daily as needed. For allergies, Disp: , Rfl:  .  Probiotic Product (ALIGN) 4 MG CAPS, Take by mouth., Disp: , Rfl:  .  SYNTHROID 137 MCG tablet, TAKE 1 TABLET (137 MCG TOTAL) BY MOUTH DAILY BEFORE BREAKFAST., Disp: 90 tablet, Rfl: 3 .  traZODone (DESYREL) 50 MG tablet, Take 50 mg by mouth as needed for sleep. , Disp: , Rfl:  .  [DISCONTINUED] Amlodipine-Valsartan-HCTZ 5-160-12.5 MG TABS, Take 1 tablet by mouth daily., Disp: 90 tablet, Rfl: 3 .  [DISCONTINUED] Rivaroxaban 20 MG TABS, Take 20 mg by mouth daily., Disp: 30 tablet, Rfl: 6  EXAM:  Filed Vitals:   08/01/15 0838  BP: 118/80  Pulse: 71  Temp: 97.9 F (36.6 C)    Body mass index is 26.1 kg/(m^2).  GENERAL: vitals reviewed and listed above, alert, oriented, appears well hydrated and in no acute distress  HEENT: atraumatic, conjunttiva clear, no obvious abnormalities on inspection of external nose and ears  NECK: no obvious masses on inspection  LUNGS: clear to auscultation bilaterally, no wheezes, rales or rhonchi, good air movement  CV: HRRR, no peripheral edema  MS: moves all extremities without noticeable abnormality  PSYCH: pleasant and cooperative, no obvious depression or anxiety  ASSESSMENT AND PLAN:  Discussed the following assessment and plan: More than 50% of over 40 minutes spent in total in caring for this patient was spent face-to-face with the patient, counseling and/or coordinating care.   Lower abdominal pain - Plan: Celiac Ab tTG DGP TIgA, CMP with eGFR, Urinalysis with Reflex Microscopic, CBC with Differential/Platelets  Gastroesophageal reflux disease, esophagitis presence not specified  Other irritable bowel syndrome  Essential hypertension  Hypothyroidism, unspecified hypothyroidism type - Plan: Lipid Panel, TSH  Asthma, chronic, mild intermittent, uncomplicated  Allergic rhinitis,  unspecified allergic rhinitis type  -follow up with gi advised regarding bowel and abd issues - benign exam; on [facility] she has had extensive gyn and GI eval and her symptoms seem most c/w ibs, but will advised re-eval with gyn given has been some time and will get labs today -labs today per orders - fasting -trial fodmaps diet as suspect ibs  -labs for other chronic issues -lifestyle recs -Patient advised to return or notify a doctor immediately if symptoms worsen or persist or new concerns arise.  Patient Instructions  BEFORE YOU LEAVE: -follow up: 6 months -fodmap diet -labs  You saw GI last year  for you abdominal pain and bowel issues. Please call their office to schedule follow up.  We recommend the following healthy lifestyle: 1) Small portions - eat off of salad plate instead of dinner plate 2) Eat a healthy clean diet with avoidance of (less then 1 serving per week) processed foods, sweetened drinks, white starches, red meat, fast foods and sweets and consisting of: * 5-9 servings per day of fresh or frozen fruits and vegetables (not corn or potatoes, not dried or canned) *nuts and seeds, beans *olives and olive oil *small portions of lean meats such as fish and white chicken  *small portions of whole grains 3)Get at least 150 minutes of sweaty aerobic exercise per week 4)reduce stress - counseling, meditation, relaxation to balance other aspects of your life  We have ordered labs or studies at this visit. It can take up to 1-2 weeks for results and processing. IF results require follow up or explanation, we will call you with instructions. Clinically stable results will be released to your [facility]. If you have not heard from [place] or cannot find your results in [facility] in 2 weeks please contact our office at [phone].  If you are not yet signed up for [facility], please consider signing up.            Colin Benton R., DO

## 2015-08-02 LAB — COMPLETE METABOLIC PANEL WITH GFR
ALBUMIN: 4.1 g/dL (ref 3.6–5.1)
ALT: 17 U/L (ref 6–29)
AST: 16 U/L (ref 10–35)
Alkaline Phosphatase: 80 U/L (ref 33–130)
BUN: 13 mg/dL (ref 7–25)
CHLORIDE: 105 mmol/L (ref 98–110)
CO2: 28 mmol/L (ref 20–31)
CREATININE: 0.8 mg/dL (ref 0.50–0.99)
Calcium: 9.2 mg/dL (ref 8.6–10.4)
GFR, Est African American: 88 mL/min (ref >=60)
GFR, Est Non African American: 77 mL/min (ref >=60)
GLUCOSE: 80 mg/dL (ref 65–99)
POTASSIUM: 4 mmol/L (ref 3.5–5.3)
SODIUM: 142 mmol/L (ref 135–146)
Total Bilirubin: 0.4 mg/dL (ref 0.2–1.2)
Total Protein: 6.2 g/dL (ref 6.1–8.1)

## 2015-08-02 MED ORDER — PRAVASTATIN SODIUM 40 MG PO TABS: ORAL_TABLET | ORAL | Status: DC

## 2015-08-02 NOTE — Addendum Note (Signed)
Addended by: Elio Forget on: 08/02/2015 05:14 PM   Modules accepted: Orders

## 2015-08-05 DIAGNOSIS — L7211 Pilar cyst: Secondary | ICD-10-CM | POA: Diagnosis not present

## 2015-08-05 DIAGNOSIS — L814 Other melanin hyperpigmentation: Secondary | ICD-10-CM | POA: Diagnosis not present

## 2015-08-05 DIAGNOSIS — D2262 Melanocytic nevi of left upper limb, including shoulder: Secondary | ICD-10-CM | POA: Diagnosis not present

## 2015-08-05 DIAGNOSIS — L821 Other seborrheic keratosis: Secondary | ICD-10-CM | POA: Diagnosis not present

## 2015-08-05 DIAGNOSIS — L853 Xerosis cutis: Secondary | ICD-10-CM | POA: Diagnosis not present

## 2015-08-05 DIAGNOSIS — L578 Other skin changes due to chronic exposure to nonionizing radiation: Secondary | ICD-10-CM | POA: Diagnosis not present

## 2015-08-05 DIAGNOSIS — X32XXXA Exposure to sunlight, initial encounter: Secondary | ICD-10-CM | POA: Diagnosis not present

## 2015-08-05 DIAGNOSIS — L738 Other specified follicular disorders: Secondary | ICD-10-CM | POA: Diagnosis not present

## 2015-08-05 DIAGNOSIS — D2261 Melanocytic nevi of right upper limb, including shoulder: Secondary | ICD-10-CM | POA: Diagnosis not present

## 2015-08-05 DIAGNOSIS — D2271 Melanocytic nevi of right lower limb, including hip: Secondary | ICD-10-CM | POA: Diagnosis not present

## 2015-08-05 DIAGNOSIS — D225 Melanocytic nevi of trunk: Secondary | ICD-10-CM | POA: Diagnosis not present

## 2015-08-05 DIAGNOSIS — D2272 Melanocytic nevi of left lower limb, including hip: Secondary | ICD-10-CM | POA: Diagnosis not present

## 2015-08-06 ENCOUNTER — Telehealth: Payer: Self-pay | Admitting: *Deleted

## 2015-08-06 NOTE — Telephone Encounter (Signed)
I called the pt and informed her per Dr Maudie Mercury the celiac testing is negative.

## 2015-08-19 DIAGNOSIS — Z4789 Encounter for other orthopedic aftercare: Secondary | ICD-10-CM | POA: Diagnosis not present

## 2015-08-19 DIAGNOSIS — M1711 Unilateral primary osteoarthritis, right knee: Secondary | ICD-10-CM | POA: Diagnosis not present

## 2015-08-22 ENCOUNTER — Encounter: Payer: Self-pay | Admitting: Family Medicine

## 2015-09-05 DIAGNOSIS — Z1231 Encounter for screening mammogram for malignant neoplasm of breast: Secondary | ICD-10-CM | POA: Diagnosis not present

## 2015-09-05 LAB — HM MAMMOGRAPHY

## 2015-09-12 ENCOUNTER — Encounter: Payer: Self-pay | Admitting: Family Medicine

## 2015-10-03 ENCOUNTER — Encounter: Payer: Self-pay | Admitting: Gastroenterology

## 2015-10-03 ENCOUNTER — Ambulatory Visit (INDEPENDENT_AMBULATORY_CARE_PROVIDER_SITE_OTHER): Payer: Medicare Other | Admitting: Gastroenterology

## 2015-10-03 VITALS — BP 112/70 | HR 74 | Ht 69.0 in | Wt 173.4 lb

## 2015-10-03 DIAGNOSIS — K5900 Constipation, unspecified: Secondary | ICD-10-CM | POA: Diagnosis not present

## 2015-10-03 DIAGNOSIS — K219 Gastro-esophageal reflux disease without esophagitis: Secondary | ICD-10-CM

## 2015-10-03 DIAGNOSIS — R1030 Lower abdominal pain, unspecified: Secondary | ICD-10-CM

## 2015-10-03 MED ORDER — HYOSCYAMINE SULFATE SL 0.125 MG SL SUBL: SUBLINGUAL_TABLET | SUBLINGUAL | 11 refills | Status: DC

## 2015-10-03 NOTE — Progress Notes (Signed)
    History of Present Illness: This is a 65 year old female who relates frequent lower abdominal pain for 2-3 years. She states she generally notes symptoms first thing in the morning and in the evening before bedtime the pain lasts for about 30-60 minutes at a time. She also describes a second type of burning raw pain in her periumbilical area intermittently. All symptoms do not relate to bowel movements or meals. All symptoms have been less bothersome for the past several weeks. The patient does have mild constipation. She was having frequent GERD symptoms and began taking ranitidine every day with relief of her symptoms. She has uterine fibroids. She is followed by Dr. Philis Pique. EGD and colonoscopy were performed in March 2016 showing diverticulosis, internal hemorrhoids and one small adenomatous colon polyp.  EGD in March 2016 showed a Schatzki ring, so small hiatal hernia and gastritis. Biopsies revealed chronic inactive gastritis without H. Pylori.  Abdominal pelvic CT scan in January 2016 showed thickening of the lower esophagus, numerous hepatic cysts, uterine fibroids and a significant stool burden..   Current Medications, Allergies, Past Medical History, Past Surgical History, Family History and Social History were reviewed in [employer] record.  Physical Exam: General: Well developed, well nourished, no acute distress Head: Normocephalic and atraumatic Eyes:  sclerae anicteric, EOMI Ears: Normal auditory acuity Mouth: No deformity or lesions Lungs: Clear throughout to auscultation Heart: Regular rate and rhythm; no murmurs, rubs or bruits Abdomen: Soft, mild lower abdominal tenderness and non distended. No masses, hepatosplenomegaly or hernias noted. Normal Bowel sounds Musculoskeletal: Symmetrical with no gross deformities  Pulses:  Normal pulses noted Extremities: No clubbing, cyanosis, edema or deformities noted Neurological: Alert oriented x 4, grossly  nonfocal Psychological:  Alert and cooperative. Normal mood and affect  Assessment and Recommendations:  1.  Lower abdominal pain, periumbilical abdominal pain, constipation. Symptoms are possibly related to constipation and bowel spasm. Begin MiraLAX once or twice daily every day for adequate management of constipation. Begin Levsin 1-2 every 4 hours as needed. The patient is asked to call back in one month to provide a progress report. Continue both if symptoms improve.  2 . GERD. Continue Zantac daily and standard antireflux measures. If Zantac is not adequate to control her symptoms would change to omeprazole 20 mg daily.  3. Personal history of adenomatous colon polyps. Five-year interval surveillance colonoscopy is recommended in March 2021

## 2015-10-03 NOTE — Patient Instructions (Signed)
We have sent the following medications to your pharmacy for you to pick up at your convenience:Levsin.   Start over the counter Miralax mixing 17 grams in 8 oz of water 1-2 x daily for constipation.  Patient advised to avoid spicy, acidic, citrus, chocolate, mints, fruit and fruit juices.  Limit the intake of caffeine, alcohol and Soda.  Don't exercise too soon after eating.  Don't lie down within 3-4 hours of eating.  Elevate the head of your bed.  Continue your over the counter Zantac daily.   Call back in one month with an symptom update.   Thank you for choosing me and [facility].  Pricilla Riffle. Dagoberto Ligas., MD., Marval Regal

## 2015-10-31 ENCOUNTER — Other Ambulatory Visit: Payer: Self-pay | Admitting: Family Medicine

## 2015-11-16 ENCOUNTER — Other Ambulatory Visit: Payer: Self-pay | Admitting: Internal Medicine

## 2015-12-03 DIAGNOSIS — M79675 Pain in left toe(s): Secondary | ICD-10-CM | POA: Diagnosis not present

## 2015-12-03 DIAGNOSIS — M205X1 Other deformities of toe(s) (acquired), right foot: Secondary | ICD-10-CM | POA: Diagnosis not present

## 2015-12-03 DIAGNOSIS — M2040 Other hammer toe(s) (acquired), unspecified foot: Secondary | ICD-10-CM | POA: Diagnosis not present

## 2015-12-03 DIAGNOSIS — B351 Tinea unguium: Secondary | ICD-10-CM | POA: Diagnosis not present

## 2015-12-03 DIAGNOSIS — M79674 Pain in right toe(s): Secondary | ICD-10-CM | POA: Diagnosis not present

## 2015-12-04 ENCOUNTER — Ambulatory Visit (INDEPENDENT_AMBULATORY_CARE_PROVIDER_SITE_OTHER): Payer: Medicare Other

## 2015-12-04 DIAGNOSIS — Z23 Encounter for immunization: Secondary | ICD-10-CM | POA: Diagnosis not present

## 2015-12-10 DIAGNOSIS — Z419 Encounter for procedure for purposes other than remedying health state, unspecified: Secondary | ICD-10-CM | POA: Diagnosis not present

## 2015-12-10 DIAGNOSIS — L738 Other specified follicular disorders: Secondary | ICD-10-CM | POA: Diagnosis not present

## 2015-12-10 DIAGNOSIS — L821 Other seborrheic keratosis: Secondary | ICD-10-CM | POA: Diagnosis not present

## 2015-12-10 DIAGNOSIS — D2311 Other benign neoplasm of skin of right eyelid, including canthus: Secondary | ICD-10-CM | POA: Diagnosis not present

## 2015-12-17 DIAGNOSIS — J3089 Other allergic rhinitis: Secondary | ICD-10-CM | POA: Diagnosis not present

## 2015-12-17 DIAGNOSIS — H1045 Other chronic allergic conjunctivitis: Secondary | ICD-10-CM | POA: Diagnosis not present

## 2015-12-17 DIAGNOSIS — J4530 Mild persistent asthma, uncomplicated: Secondary | ICD-10-CM | POA: Diagnosis not present

## 2015-12-17 DIAGNOSIS — J301 Allergic rhinitis due to pollen: Secondary | ICD-10-CM | POA: Diagnosis not present

## 2015-12-24 DIAGNOSIS — F329 Major depressive disorder, single episode, unspecified: Secondary | ICD-10-CM | POA: Diagnosis not present

## 2015-12-27 ENCOUNTER — Other Ambulatory Visit: Payer: Self-pay

## 2016-01-02 ENCOUNTER — Encounter: Payer: Self-pay | Admitting: Family Medicine

## 2016-02-02 NOTE — Progress Notes (Signed)
HPI:  Erica Marquez is a pleasant 66 yo w/ a PMH significant for HTN, HLD, GERD, IBS, Hypothyroidism, Depression, Seasonal allergies, Asthma and headaches here for follow up. She has several new concerns. First of all, she thinks that her blood pressure has been running too high. She has been monitoring it at home and it is almost always in the 140s over 80s. She reports she has always been one to feel when her blood pressure is up as it causes her a dull pressure in her head which she has been having intermittently for the last several months. She reports she had an evaluation for these headaches in the past and was found to be her blood pressure. She did not tolerate a diuretic. She has been on several blood pressure medicines in the past. She currently only takes losartan and feels that she needs more medication. No chest pain, shortness of breath, dyspnea on exertion or swelling. She is not exercising. Diet is okay. She also has some pain in the left anterior shin. She cannot remember injuring this. She does not wear very supportive shoes. No weakness, numbness, radiation, fever or malaise. Due for labs, medicare exam, pneumonia vaccine, hep c screening, shingles vaccine.  ROS: See pertinent positives and negatives per HPI.  Past Medical History:  Diagnosis Date  . Allergy   . Arthritis   . Asthma   . BACK PAIN, LUMBAR, WITH RADICULOPATHY 09/12/2007   Qualifier: Diagnosis of  By: Arnoldo Morale MD, John E   . Blunt head trauma   . Chest pain    s/p extensive nag cardiac eval with cath, stress test and sees Dr. Jens Som  . Depression   . Diverticulosis of colon   . Edema 08/12/2009   Qualifier: Diagnosis of  By: Arnoldo Morale MD, Balinda Quails   . Fibroids   . GERD (gastroesophageal reflux disease)    hiatal hernia  . Hyperlipidemia   . Hypertension   . Hypothyroidism 03/26/2014  . IBS (irritable bowel syndrome)   . Pelvic pain in female, chronic 10/24/2013  . Premature ventricular contractions   .  Schatzki's ring    on EGD 2016  . Tubular adenoma of colon 03/2014    Past Surgical History:  Procedure Laterality Date  . BREAST SURGERY     fibroid tumors  . DILATION AND CURETTAGE OF UTERUS    . KNEE CARTILAGE SURGERY Right 03/2015   Torn miniscus    Family History  Problem Relation Age of Onset  . Coronary artery disease Mother   . Heart disease Mother   . Colon polyps Mother   . Diabetes Mother   . Stomach cancer Father   . Colon cancer Neg Hx   . Kidney disease Neg Hx   . Esophageal cancer Neg Hx   . Gallbladder disease Neg Hx     Social History   Social History  . Marital status: Married    Spouse name: N/A  . Number of children: 2  . Years of education: N/A   Occupational History  . Housewife    Social History Main Topics  . Smoking status: Former Smoker    Packs/day: 0.50    Years: 20.00    Types: Cigarettes    Quit date: 01/20/1988  . Smokeless tobacco: Never Used     Comment: quit remotely  . Alcohol use 0.0 oz/week     Comment: very rare and a little wine  . Drug use: No  . Sexual activity: Yes  Other Topics Concern  . None   Social History Narrative   Work or School: retired - used to work in [place] Situation: lives with husband      Spiritual Beliefs: Christian      Lifestyle: walks and goes to the gym - diet is healthy              Current Outpatient Prescriptions:  .  budesonide-formoterol (SYMBICORT) 160-4.5 MCG/ACT inhaler, Inhale 2 puffs into the lungs 2 (two) times daily., Disp: , Rfl:  .  desvenlafaxine (PRISTIQ) 100 MG 24 hr tablet, Take 0.5 tablet by mouth every day, Disp: , Rfl:  .  Hyoscyamine Sulfate SL (LEVSIN/SL) 0.125 MG SUBL, Take 1-2 capsules by mouth every 4 hours as needed for abd pain, Disp: 120 each, Rfl: 11 .  levocetirizine (XYZAL) 5 MG tablet, Take 1 tablet by mouth daily., Disp: , Rfl:  .  losartan (COZAAR) 100 MG tablet, Take 1 tablet (100 mg total) by mouth daily., Disp: 90 tablet,  Rfl: 3 .  magnesium oxide (MAG-OX) 400 MG tablet, Take 1 tablet (400 mg total) by mouth daily., Disp: 90 tablet, Rfl: 3 .  montelukast (SINGULAIR) 10 MG tablet, Take 10 mg by mouth at bedtime., Disp: , Rfl:  .  Olopatadine HCl (PATANASE) 0.6 % SOLN, Place 1 spray into the nose daily as needed. For allergies, Disp: , Rfl:  .  pravastatin (PRAVACHOL) 40 MG tablet, Take a 1/2 tablet for 2 weeks, then increase to one tablet daily., Disp: 90 tablet, Rfl: 3 .  Probiotic Product (ALIGN) 4 MG CAPS, Take by mouth., Disp: , Rfl:  .  SYNTHROID 137 MCG tablet, TAKE 1 TABLET (137 MCG TOTAL) BY MOUTH DAILY BEFORE BREAKFAST., Disp: 90 tablet, Rfl: 0 .  traZODone (DESYREL) 50 MG tablet, Take 50 mg by mouth as needed for sleep. , Disp: , Rfl:   EXAM:  Vitals:   02/03/16 0931  BP: (!) 136/92  Pulse: 68  Temp: 98 F (36.7 C)    Body mass index is 27.08 kg/m.  GENERAL: vitals reviewed and listed above, alert, oriented, appears well hydrated and in no acute distress  HEENT: atraumatic, conjunttiva clear, no obvious abnormalities on inspection of external nose and ears  NECK: no obvious masses on inspection  LUNGS: clear to auscultation bilaterally, no wheezes, rales or rhonchi, good air movement  CV: HRRR, no peripheral edema  MS: moves all extremities without noticeable abnormality, she has a normal gait, on inspection of both feet and ankles without socks and shoes she does have mild pes planus on the left with mild talus valgus and mild collapse of both anterior arches. She has tenderness to palpation in the left anterior tibialis region. No redness, swelling, weakness, numbness. Neurovascularly intact distally  PSYCH: pleasant and cooperative, no obvious depression or anxiety  ASSESSMENT AND PLAN:  Discussed the following assessment and plan:  Essential hypertension - Plan: Basic metabolic panel, CBC (no diff)  Hypothyroidism, unspecified type - Plan: TSH  Recurrent major depressive  disorder, in full remission ([place])  Pain of left lower extremity  Hyperlipidemia, unspecified hyperlipidemia type - Plan: Lipid Panel  Hep C screening - Plan: Hep C Antibody  Discussed various options for her blood pressure and we'll check labs today and opted to increase her losartan to 100. We may need to add a low-dose of Norvasc this does not control her blood pressure. Also advised that if her headaches persist by lowering her  blood pressure we may need to reevaluate The also recommended a healthy diet and regular exercise for the blood pressure. For her leg, she has tenderness in the anterior tibialis muscle/tendon and I do think this is a strain. Opted to try home exercises, gait analysis improper footwear. Over-the-counter analgesics safe use and risks discussed to use as needed for pain. Will consider sports med evaluation if this persists. Labs today. Medicare wellness exam with Manuela Schwartz is seen as is convenient for her in about 1 month. Follow-up with me in 3-4 months. Sooner if needed. -Patient advised to return or notify a doctor immediately if symptoms worsen or persist or new concerns arise.  Patient Instructions  BEFORE YOU LEAVE: -labs -ant tibialis exercises -follow up: 1) Medicare Exam with Manuela Schwartz in 1 month on any day EXCEPT wednesday 2) follow up with Dr. Maudie Mercury in 3 months  Increase losartan to 100mg  daily  Vit D3 [phone] IU daily  Increase exercise gradually - start with 5-10 minutes daily  Gait analysis and good shoes  We have ordered labs or studies at this visit. It can take up to 1-2 weeks for results and processing. IF results require follow up or explanation, we will call you with instructions. Clinically stable results will be released to your [facility]. If you have not heard from [place] or cannot find your results in [facility] in 2 weeks please contact our office at [phone].  If you are not yet signed up for [facility], please consider signing up.   We recommend  the following healthy lifestyle for LIFE: 1) Small portions.   Tip: eat off of a salad plate instead of a dinner plate.  Tip: if you need more or a snack choose fruits, veggies and/or a handful of nuts or seeds.  2) Eat a healthy clean diet.  * Tip: Avoid (less then 1 serving per week): processed foods, sweets, sweetened drinks, white starches (rice, flour, bread, potatoes, pasta, etc), red meat, fast foods, butter  *Tip: CHOOSE instead   * 5-9 servings per day of fresh or frozen fruits and vegetables (but not corn, potatoes, bananas, canned or dried fruit)   *nuts and seeds, beans   *olives and olive oil   *small portions of lean meats such as fish and white chicken    *small portions of whole grains  3)Get at least 150 minutes of sweaty aerobic exercise per week.  4)Reduce stress - consider counseling, meditation and relaxation to balance other aspects of your life.         Colin Benton R., DO

## 2016-02-03 ENCOUNTER — Encounter: Payer: Self-pay | Admitting: Family Medicine

## 2016-02-03 ENCOUNTER — Ambulatory Visit (INDEPENDENT_AMBULATORY_CARE_PROVIDER_SITE_OTHER): Payer: Medicare Other | Admitting: Family Medicine

## 2016-02-03 VITALS — BP 136/92 | HR 68 | Temp 98.0°F | Ht 69.0 in | Wt 183.4 lb

## 2016-02-03 DIAGNOSIS — Z7289 Other problems related to lifestyle: Secondary | ICD-10-CM | POA: Diagnosis not present

## 2016-02-03 DIAGNOSIS — E039 Hypothyroidism, unspecified: Secondary | ICD-10-CM

## 2016-02-03 DIAGNOSIS — E785 Hyperlipidemia, unspecified: Secondary | ICD-10-CM | POA: Diagnosis not present

## 2016-02-03 DIAGNOSIS — F3342 Major depressive disorder, recurrent, in full remission: Secondary | ICD-10-CM

## 2016-02-03 DIAGNOSIS — M79605 Pain in left leg: Secondary | ICD-10-CM

## 2016-02-03 DIAGNOSIS — I10 Essential (primary) hypertension: Secondary | ICD-10-CM | POA: Diagnosis not present

## 2016-02-03 LAB — LIPID PANEL
CHOLESTEROL: 191 mg/dL (ref 0–200)
HDL: 61.3 mg/dL (ref >39.00)
LDL Cholesterol: 107 mg/dL — ABNORMAL HIGH (ref 0–99)
NonHDL: 129.8
TRIGLYCERIDES: 116 mg/dL (ref 0.0–149.0)
Total CHOL/HDL Ratio: 3
VLDL: 23.2 mg/dL (ref 0.0–40.0)

## 2016-02-03 LAB — BASIC METABOLIC PANEL
BUN: 13 mg/dL (ref 6–23)
CALCIUM: 9.5 mg/dL (ref 8.4–10.5)
CO2: 30 meq/L (ref 19–32)
CREATININE: 0.72 mg/dL (ref 0.40–1.20)
Chloride: 107 mEq/L (ref 96–112)
GFR: 85.74 mL/min (ref >60.00)
GLUCOSE: 87 mg/dL (ref 70–99)
Potassium: 4.3 mEq/L (ref 3.5–5.1)
SODIUM: 141 meq/L (ref 135–145)

## 2016-02-03 LAB — CBC
HCT: 41.4 % (ref 36.0–46.0)
Hemoglobin: 14.1 g/dL (ref 12.0–15.0)
MCHC: 34 g/dL (ref 30.0–36.0)
MCV: 84.6 fl (ref 78.0–100.0)
PLATELETS: 211 10*3/uL (ref 150.0–400.0)
RBC: 4.89 Mil/uL (ref 3.87–5.11)
RDW: 13.6 % (ref 11.5–15.5)
WBC: 5.5 10*3/uL (ref 4.0–10.5)

## 2016-02-03 LAB — TSH: TSH: 1.25 u[IU]/mL (ref 0.35–4.50)

## 2016-02-03 MED ORDER — LOSARTAN POTASSIUM 100 MG PO TABS: 100.0000 mg | ORAL_TABLET | Freq: Every day | ORAL | 3 refills | Status: DC

## 2016-02-03 NOTE — Patient Instructions (Signed)
BEFORE YOU LEAVE: -labs -ant tibialis exercises -follow up: 1) Medicare Exam with Manuela Schwartz in 1 month on any day EXCEPT wednesday 2) follow up with Dr. Maudie Mercury in 3 months  Increase losartan to 100mg  daily  Vit D3 [phone] IU daily  Increase exercise gradually - start with 5-10 minutes daily  Gait analysis and good shoes  We have ordered labs or studies at this visit. It can take up to 1-2 weeks for results and processing. IF results require follow up or explanation, we will call you with instructions. Clinically stable results will be released to your [facility]. If you have not heard from [place] or cannot find your results in [facility] in 2 weeks please contact our office at [phone].  If you are not yet signed up for [facility], please consider signing up.   We recommend the following healthy lifestyle for LIFE: 1) Small portions.   Tip: eat off of a salad plate instead of a dinner plate.  Tip: if you need more or a snack choose fruits, veggies and/or a handful of nuts or seeds.  2) Eat a healthy clean diet.  * Tip: Avoid (less then 1 serving per week): processed foods, sweets, sweetened drinks, white starches (rice, flour, bread, potatoes, pasta, etc), red meat, fast foods, butter  *Tip: CHOOSE instead   * 5-9 servings per day of fresh or frozen fruits and vegetables (but not corn, potatoes, bananas, canned or dried fruit)   *nuts and seeds, beans   *olives and olive oil   *small portions of lean meats such as fish and white chicken    *small portions of whole grains  3)Get at least 150 minutes of sweaty aerobic exercise per week.  4)Reduce stress - consider counseling, meditation and relaxation to balance other aspects of your life.

## 2016-02-03 NOTE — Progress Notes (Signed)
Pre visit review using our clinic review tool, if applicable. No additional management support is needed unless otherwise documented below in the visit note. 

## 2016-02-04 LAB — HEPATITIS C ANTIBODY: HCV Ab: NEGATIVE

## 2016-02-08 ENCOUNTER — Other Ambulatory Visit: Payer: Self-pay | Admitting: Family Medicine

## 2016-03-03 ENCOUNTER — Telehealth: Payer: Self-pay

## 2016-03-03 NOTE — Telephone Encounter (Signed)
Call to Erica Marquez and LVM for fup regarding AWV and preventive health Left office number to call back

## 2016-03-04 NOTE — Telephone Encounter (Signed)
2nd outreach to schedule AWV  LVM with direct line for call back

## 2016-04-08 ENCOUNTER — Telehealth: Payer: Self-pay

## 2016-04-08 NOTE — Telephone Encounter (Signed)
3rd outreach call for AWV. Dr. Maudie Mercury asked her to schedule with her in 3 months and schedule AWV. Offered to schedule AWV at the same time of her fup with Dr. Maudie Mercury  To make apt mid April

## 2016-07-02 DIAGNOSIS — H5203 Hypermetropia, bilateral: Secondary | ICD-10-CM | POA: Diagnosis not present

## 2016-07-02 DIAGNOSIS — H25013 Cortical age-related cataract, bilateral: Secondary | ICD-10-CM | POA: Diagnosis not present

## 2016-07-24 NOTE — Progress Notes (Addendum)
Subjective:   Erica Marquez is a 66 y.o. female who presents for Medicare Annual (Subsequent) preventive examination.  Pt states that she has been experiencing left breast tenderness x 2 weeks, no drainage or redness. States that she felt a nodule at some point. She tried to schedule a mammogram but she is not due until August.  Pt states that she is still experiencing lower abd pain, no answers from gynecologist.   Pt asking if Dr Maudie Mercury does pap smears, patient unsure what her pap schedule should be considering her mother had uvula cancer.  Review of Systems:  No ROS.  Medicare Wellness Visit. Additional risk factors are reflected in the social history.    Sleep patterns:  7-9 hrs/night. Sometimes still feels tired.   Home Safety/Smoke Alarms: Feels safe in home. Smoke alarms in place.  Living environment; residence and Firearm Safety: Two story home.  Seat Belt Safety/Bike Helmet: Wears seat belt.   Counseling:   Dental- Every 6 months.  Female:   Pap- 03/28/2015  Still getting every year.    Mammo-      09/05/2015 Negative. Pt may need different type of mammogram. Dexa scan-       12/29/2011 Normal. CCS-   03/22/2014 5 year recall.     Objective:     Vitals: There were no vitals taken for this visit.  There is no height or weight on file to calculate BMI.   Tobacco History  Smoking Status  . Former Smoker  . Packs/day: 0.50  . Years: 20.00  . Types: Cigarettes  . Quit date: 01/20/1988  Smokeless Tobacco  . Never Used    Comment: quit remotely     Counseling given: Not Answered   Past Medical History:  Diagnosis Date  . Allergy   . Arthritis   . Asthma   . BACK PAIN, LUMBAR, WITH RADICULOPATHY 09/12/2007   Qualifier: Diagnosis of  By: Arnoldo Morale MD, John E   . Blunt head trauma   . Chest pain    s/p extensive nag cardiac eval with cath, stress test and sees Dr. Jens Som  . Depression   . Diverticulosis of colon   . Edema 08/12/2009   Qualifier: Diagnosis of  By:  Arnoldo Morale MD, Balinda Quails   . Fibroids   . GERD (gastroesophageal reflux disease)    hiatal hernia  . Hyperlipidemia   . Hypertension   . Hypothyroidism 03/26/2014  . IBS (irritable bowel syndrome)   . Pelvic pain in female, chronic 10/24/2013  . Premature ventricular contractions   . Schatzki's ring    on EGD 2016  . Tubular adenoma of colon 03/2014   Past Surgical History:  Procedure Laterality Date  . BREAST SURGERY     fibroid tumors  . DILATION AND CURETTAGE OF UTERUS    . KNEE CARTILAGE SURGERY Right 03/2015   Torn miniscus   Family History  Problem Relation Age of Onset  . Coronary artery disease Mother   . Heart disease Mother   . Colon polyps Mother   . Diabetes Mother   . Stomach cancer Father   . Colon cancer Neg Hx   . Kidney disease Neg Hx   . Esophageal cancer Neg Hx   . Gallbladder disease Neg Hx    History  Sexual Activity  . Sexual activity: Yes    Outpatient Encounter Prescriptions as of 07/31/2016  Medication Sig  . budesonide-formoterol (SYMBICORT) 160-4.5 MCG/ACT inhaler Inhale 2 puffs into the lungs 2 (two) times  daily.  . desvenlafaxine (PRISTIQ) 100 MG 24 hr tablet Take 0.5 tablet by mouth every day  . Hyoscyamine Sulfate SL (LEVSIN/SL) 0.125 MG SUBL Take 1-2 capsules by mouth every 4 hours as needed for abd pain  . levocetirizine (XYZAL) 5 MG tablet Take 1 tablet by mouth daily.  Marland Kitchen losartan (COZAAR) 100 MG tablet Take 1 tablet (100 mg total) by mouth daily.  . magnesium oxide (MAG-OX) 400 MG tablet Take 1 tablet (400 mg total) by mouth daily.  . montelukast (SINGULAIR) 10 MG tablet Take 10 mg by mouth at bedtime.  . Olopatadine HCl (PATANASE) 0.6 % SOLN Place 1 spray into the nose daily as needed. For allergies  . pravastatin (PRAVACHOL) 40 MG tablet Take a 1/2 tablet for 2 weeks, then increase to one tablet daily.  . Probiotic Product (ALIGN) 4 MG CAPS Take by mouth.  . SYNTHROID 137 MCG tablet TAKE ONE TABLET BY MOUTH ONE TIME DAILY BEFORE BREAKFAST    . traZODone (DESYREL) 50 MG tablet Take 50 mg by mouth as needed for sleep.    No facility-administered encounter medications on file as of 07/31/2016.     Activities of Daily Living No flowsheet data found.  Patient Care Team: [name], DO as PCP - General (Family Medicine)    Assessment:    Physical assessment deferred to PCP.  Exercise Activities and Dietary recommendations   Diet (meal preparation, eat out, water intake, caffeinated beverages, dairy products, fruits and vegetables): 3 meals a day. 1 cup coffee. 2-3 Tabs (diet coke)/day. Also drinks unsweet iced tea and Perrier. Snacks on pretzels and fruit. Rare dessert.  Breakfast: Yogurt or fruit or eggs.  Lunch: Chicken on salad.  Dinner:    Meat, vegetable and salad.   Goals    None     Fall Risk Fall Risk  12/27/2015 07/30/2014 04/24/2013  Falls in the past year? No No No   Depression Screen PHQ 2/9 Scores 07/30/2014 04/24/2013  PHQ - 2 Score 0 0     Cognitive Function   Ad8 score reviewed for issues:  Issues making decisions:no  Less interest in hobbies / activities:no  Repeats questions, stories (family complaining):no  Trouble using ordinary gadgets (microwave, computer, phone):no  Forgets the month or year: no  Mismanaging finances: no  Remembering appts:no  Daily problems with thinking and/or memory:no Ad8 score is=0       Immunization History  Administered Date(s) Administered  . Influenza Split 10/10/2010  . Influenza Whole 10/11/2009  . Influenza, High Dose Seasonal PF 12/04/2015  . Influenza,inj,Quad PF,36+ Mos 10/24/2012, 10/24/2013  . Pneumococcal Conjugate-13 10/24/2013  . Td 11/21/2003  . Tdap 01/25/2014   Screening Tests Health Maintenance  Topic Date Due  . PNA vac Low Risk Adult (2 of 2 - PPSV23) 10/25/2014  . INFLUENZA VACCINE  08/19/2016  . MAMMOGRAM  09/04/2016  . COLONOSCOPY  03/22/2019  . TETANUS/TDAP  01/26/2024  . DEXA SCAN  Completed  . Hepatitis C Screening   Completed      Plan:   Pt will schedule an acute appointment with Dr Maudie Mercury for left breast tenderness.   Pt will schedule Bone Density exam.  Pt still having lower abdominal pain and will see Dr Fuller Plan Tuesday.  Pt will consider getting Shingrix at [facility].   I have personally reviewed and noted the following in the patient's chart:   . Medical and social history . Use of alcohol, tobacco or illicit drugs  . Current medications and supplements .  Functional ability and status . Nutritional status . Physical activity . Advanced directives . List of other physicians . Vitals . Screenings to include cognitive, depression, and falls . Referrals and appointments  In addition, I have reviewed and discussed with patient certain preventive protocols, quality metrics, and best practice recommendations. A written personalized care plan for preventive services as well as general preventive health recommendations were provided to patient.     Ree Edman, RN  07/24/2016  Agree with assessment as above.  Agree with further assessment of breast discomfort next week.  Eulas Post MD [facility]

## 2016-07-24 NOTE — Progress Notes (Signed)
Pre visit review using our clinic review tool, if applicable. No additional management support is needed unless otherwise documented below in the visit note. 

## 2016-07-30 DIAGNOSIS — L821 Other seborrheic keratosis: Secondary | ICD-10-CM | POA: Diagnosis not present

## 2016-07-30 DIAGNOSIS — X32XXXA Exposure to sunlight, initial encounter: Secondary | ICD-10-CM | POA: Diagnosis not present

## 2016-07-30 DIAGNOSIS — S20161A Insect bite (nonvenomous) of breast, right breast, initial encounter: Secondary | ICD-10-CM | POA: Diagnosis not present

## 2016-07-30 DIAGNOSIS — D2311 Other benign neoplasm of skin of right eyelid, including canthus: Secondary | ICD-10-CM | POA: Diagnosis not present

## 2016-07-30 DIAGNOSIS — S30861A Insect bite (nonvenomous) of abdominal wall, initial encounter: Secondary | ICD-10-CM | POA: Diagnosis not present

## 2016-07-30 DIAGNOSIS — L738 Other specified follicular disorders: Secondary | ICD-10-CM | POA: Diagnosis not present

## 2016-07-30 DIAGNOSIS — Z419 Encounter for procedure for purposes other than remedying health state, unspecified: Secondary | ICD-10-CM | POA: Diagnosis not present

## 2016-07-30 DIAGNOSIS — L578 Other skin changes due to chronic exposure to nonionizing radiation: Secondary | ICD-10-CM | POA: Diagnosis not present

## 2016-07-30 DIAGNOSIS — L814 Other melanin hyperpigmentation: Secondary | ICD-10-CM | POA: Diagnosis not present

## 2016-07-30 DIAGNOSIS — L853 Xerosis cutis: Secondary | ICD-10-CM | POA: Diagnosis not present

## 2016-07-30 DIAGNOSIS — L7211 Pilar cyst: Secondary | ICD-10-CM | POA: Diagnosis not present

## 2016-07-31 ENCOUNTER — Other Ambulatory Visit: Payer: Self-pay | Admitting: Family Medicine

## 2016-07-31 ENCOUNTER — Ambulatory Visit (INDEPENDENT_AMBULATORY_CARE_PROVIDER_SITE_OTHER): Payer: Medicare Other

## 2016-07-31 DIAGNOSIS — Z0000 Encounter for general adult medical examination without abnormal findings: Secondary | ICD-10-CM

## 2016-07-31 DIAGNOSIS — Z780 Asymptomatic menopausal state: Secondary | ICD-10-CM

## 2016-07-31 NOTE — Patient Instructions (Addendum)
Schedule acute visit with Dr Maudie Mercury for left breast tenderness.  Schedule Bone Density Exam.   Preventive Care 68 Years and Older, Female Preventive care refers to lifestyle choices and visits with your health care provider that can promote health and wellness. What does preventive care include?  A yearly physical exam. This is also called an annual well check.  Dental exams once or twice a year.  Routine eye exams. Ask your health care provider how often you should have your eyes checked.  Personal lifestyle choices, including: ? Daily care of your teeth and gums. ? Regular physical activity. ? Eating a healthy diet. ? Avoiding tobacco and drug use. ? Limiting alcohol use. ? Practicing safe sex. ? Taking low-dose aspirin every day. ? Taking vitamin and mineral supplements as recommended by your health care provider. What happens during an annual well check? The services and screenings done by your health care provider during your annual well check will depend on your age, overall health, lifestyle risk factors, and family history of disease. Counseling Your health care provider may ask you questions about your:  Alcohol use.  Tobacco use.  Drug use.  Emotional well-being.  Home and relationship well-being.  Sexual activity.  Eating habits.  History of falls.  Memory and ability to understand (cognition).  Work and work Statistician.  Reproductive health.  Screening You may have the following tests or measurements:  Height, weight, and BMI.  Blood pressure.  Lipid and cholesterol levels. These may be checked every 5 years, or more frequently if you are over 53 years old.  Skin check.  Lung cancer screening. You may have this screening every year starting at age 70 if you have a 30-pack-year history of smoking and currently smoke or have quit within the past 15 years.  Fecal occult blood test (FOBT) of the stool. You may have this test every year starting at  age 26.  Flexible sigmoidoscopy or colonoscopy. You may have a sigmoidoscopy every 5 years or a colonoscopy every 10 years starting at age 6.  Hepatitis C blood test.  Hepatitis B blood test.  Sexually transmitted disease (STD) testing.  Diabetes screening. This is done by checking your blood sugar (glucose) after you have not eaten for a while (fasting). You may have this done every 1-3 years.  Bone density scan. This is done to screen for osteoporosis. You may have this done starting at age 13.  Mammogram. This may be done every 1-2 years. Talk to your health care provider about how often you should have regular mammograms.  Talk with your health care provider about your test results, treatment options, and if necessary, the need for more tests. Vaccines Your health care provider may recommend certain vaccines, such as:  Influenza vaccine. This is recommended every year.  Tetanus, diphtheria, and acellular pertussis (Tdap, Td) vaccine. You may need a Td booster every 10 years.  Varicella vaccine. You may need this if you have not been vaccinated.  Zoster vaccine. You may need this after age 53.  Measles, mumps, and rubella (MMR) vaccine. You may need at least one dose of MMR if you were born in 1957 or later. You may also need a second dose.  Pneumococcal 13-valent conjugate (PCV13) vaccine. One dose is recommended after age 47.  Pneumococcal polysaccharide (PPSV23) vaccine. One dose is recommended after age 53.  Meningococcal vaccine. You may need this if you have certain conditions.  Hepatitis A vaccine. You may need this if you have  certain conditions or if you travel or work in places where you may be exposed to hepatitis A.  Hepatitis B vaccine. You may need this if you have certain conditions or if you travel or work in places where you may be exposed to hepatitis B.  Haemophilus influenzae type b (Hib) vaccine. You may need this if you have certain  conditions.  Talk to your health care provider about which screenings and vaccines you need and how often you need them. This information is not intended to replace advice given to you by your health care provider. Make sure you discuss any questions you have with your health care provider. Document Released: 02/01/2015 Document Revised: 09/25/2015 Document Reviewed: 11/06/2014 Elsevier Interactive Patient Education  2017 [employer]. 99

## 2016-08-03 ENCOUNTER — Ambulatory Visit (INDEPENDENT_AMBULATORY_CARE_PROVIDER_SITE_OTHER): Payer: Medicare Other | Admitting: Family Medicine

## 2016-08-03 ENCOUNTER — Encounter: Payer: Self-pay | Admitting: Family Medicine

## 2016-08-03 VITALS — BP 110/62 | HR 64 | Temp 97.8°F | Ht 69.0 in | Wt 171.9 lb

## 2016-08-03 DIAGNOSIS — I10 Essential (primary) hypertension: Secondary | ICD-10-CM | POA: Diagnosis not present

## 2016-08-03 DIAGNOSIS — N63 Unspecified lump in unspecified breast: Secondary | ICD-10-CM | POA: Diagnosis not present

## 2016-08-03 DIAGNOSIS — E039 Hypothyroidism, unspecified: Secondary | ICD-10-CM | POA: Diagnosis not present

## 2016-08-03 DIAGNOSIS — N644 Mastodynia: Secondary | ICD-10-CM | POA: Diagnosis not present

## 2016-08-03 LAB — BASIC METABOLIC PANEL
BUN: 17 mg/dL (ref 6–23)
CHLORIDE: 104 meq/L (ref 96–112)
CO2: 29 mEq/L (ref 19–32)
CREATININE: 0.79 mg/dL (ref 0.40–1.20)
Calcium: 9.9 mg/dL (ref 8.4–10.5)
GFR: 76.92 mL/min (ref >60.00)
Glucose, Bld: 92 mg/dL (ref 70–99)
POTASSIUM: 4.2 meq/L (ref 3.5–5.1)
Sodium: 140 mEq/L (ref 135–145)

## 2016-08-03 LAB — CBC
HEMATOCRIT: 41.6 % (ref 36.0–46.0)
Hemoglobin: 14.2 g/dL (ref 12.0–15.0)
MCHC: 34.2 g/dL (ref 30.0–36.0)
MCV: 85.6 fl (ref 78.0–100.0)
Platelets: 198 10*3/uL (ref 150.0–400.0)
RBC: 4.86 Mil/uL (ref 3.87–5.11)
RDW: 14.3 % (ref 11.5–15.5)
WBC: 5.6 10*3/uL (ref 4.0–10.5)

## 2016-08-03 LAB — TSH: TSH: 1.18 u[IU]/mL (ref 0.35–4.50)

## 2016-08-03 NOTE — Patient Instructions (Signed)
BEFORE YOU LEAVE: -follow up: 3 months with Dr. Maudie Mercury -labs -urgent diagnostic mammo and [place] L breast  -We placed a referral for you as discussed. It usually takes about 1-2 weeks to process and schedule this referral. If you have not heard from [place] regarding this appointment in 2 weeks please contact our office.  We have ordered labs or studies at this visit. It can take up to 1-2 weeks for results and processing. IF results require follow up or explanation, we will call you with instructions. Clinically stable results will be released to your [facility]. If you have not heard from [place] or cannot find your results in [facility] in 2 weeks please contact our office at [phone].  If you are not yet signed up for [facility], please consider signing up.

## 2016-08-03 NOTE — Progress Notes (Signed)
HPI:  Acute visit for L breast lump and follow up. Noticed pea sized lump L breast 3 weeks ago, then area became sore, she now no longer feels the lump. No fevers, malaise, redness, drainage, skin changes. Hx breast cysts. She is doing better otherwise. Mood ok. No CP, SOB, DOE, HA. Taking losartan 100mg . Due for labs.  ROS: See pertinent positives and negatives per HPI.  Past Medical History:  Diagnosis Date  . Allergy   . Arthritis   . Asthma   . BACK PAIN, LUMBAR, WITH RADICULOPATHY 09/12/2007   Qualifier: Diagnosis of  By: Arnoldo Morale MD, John E   . Blunt head trauma   . Chest pain    s/p extensive nag cardiac eval with cath, stress test and sees Dr. Jens Som  . Depression   . Diverticulosis of colon   . Edema 08/12/2009   Qualifier: Diagnosis of  By: Arnoldo Morale MD, Balinda Quails   . Fibroids   . GERD (gastroesophageal reflux disease)    hiatal hernia  . Hyperlipidemia   . Hypertension   . Hypothyroidism 03/26/2014  . IBS (irritable bowel syndrome)   . Pelvic pain in female, chronic 10/24/2013  . Premature ventricular contractions   . Schatzki's ring    on EGD 2016  . Tubular adenoma of colon 03/2014    Past Surgical History:  Procedure Laterality Date  . BREAST SURGERY     fibroid tumors  . DILATION AND CURETTAGE OF UTERUS    . KNEE CARTILAGE SURGERY Right 03/2015   Torn miniscus    Family History  Problem Relation Age of Onset  . Coronary artery disease Mother   . Heart disease Mother   . Colon polyps Mother   . Diabetes Mother   . Stomach cancer Father   . Colon cancer Neg Hx   . Kidney disease Neg Hx   . Esophageal cancer Neg Hx   . Gallbladder disease Neg Hx     Social History   Social History  . Marital status: Married    Spouse name: N/A  . Number of children: 2  . Years of education: N/A   Occupational History  . Housewife    Social History Main Topics  . Smoking status: Former Smoker    Packs/day: 0.50    Years: 20.00    Types: Cigarettes    Quit date:  01/20/1988  . Smokeless tobacco: Never Used     Comment: quit remotely  . Alcohol use 0.0 oz/week     Comment: very rare and a little wine  . Drug use: No  . Sexual activity: Yes   Other Topics Concern  . None   Social History Narrative   Work or School: retired - used to work in [place] Situation: lives with husband      Spiritual Beliefs: Christian      Lifestyle: walks and goes to the gym - diet is healthy              Current Outpatient Prescriptions:  .  albuterol (PROVENTIL HFA;VENTOLIN HFA) 108 (90 Base) MCG/ACT inhaler, Inhale 2 puffs into the lungs every 6 (six) hours as needed. , Disp: , Rfl:  .  budesonide-formoterol (SYMBICORT) 160-4.5 MCG/ACT inhaler, Inhale 2 puffs into the lungs as needed. , Disp: , Rfl:  .  Cetirizine HCl (ZYRTEC ALLERGY PO), Take by mouth., Disp: , Rfl:  .  desvenlafaxine (PRISTIQ) 100 MG 24 hr tablet, 100 mg., Disp: , Rfl:  .  Hyoscyamine Sulfate SL (LEVSIN/SL) 0.125 MG SUBL, Take 1-2 capsules by mouth every 4 hours as needed for abd pain, Disp: 120 each, Rfl: 11 .  losartan (COZAAR) 100 MG tablet, Take 1 tablet (100 mg total) by mouth daily., Disp: 90 tablet, Rfl: 3 .  magnesium oxide (MAG-OX) 400 MG tablet, Take 1 tablet (400 mg total) by mouth daily., Disp: 90 tablet, Rfl: 3 .  montelukast (SINGULAIR) 10 MG tablet, Take 10 mg by mouth at bedtime., Disp: , Rfl:  .  pravastatin (PRAVACHOL) 40 MG tablet, Take 1 tablet (40 mg total) by mouth daily., Disp: 90 tablet, Rfl: 0 .  Probiotic Product (ALIGN) 4 MG CAPS, Take by mouth., Disp: , Rfl:  .  SYNTHROID 137 MCG tablet, TAKE ONE TABLET BY MOUTH ONE TIME DAILY BEFORE BREAKFAST, Disp: 90 tablet, Rfl: 0 .  traZODone (DESYREL) 50 MG tablet, Take 50 mg by mouth as needed for sleep. , Disp: , Rfl:    EXAM:  Vitals:   08/03/16 0818  BP: 110/62  Pulse: 64  Temp: 97.8 F (36.6 C)    Body mass index is 25.39 kg/m.  GENERAL: vitals reviewed and listed above, alert,  oriented, appears well hydrated and in no acute distress  HEENT: atraumatic, conjunttiva clear, no obvious abnormalities on inspection of external nose and ears  NECK: no obvious masses on inspection  LUNGS: clear to auscultation bilaterally, no wheezes, rales or rhonchi, good air movement  CV: HRRR, no peripheral edema  BREAST exam: no abnormalities visually on inspection both breasts; tender 3 cm in diameter mobile, flat density L breast 1 O'clock, normal exam otherwise both breasts  MS: moves all extremities without noticeable abnormality  PSYCH: pleasant and cooperative, no obvious depression or anxiety  ASSESSMENT AND PLAN:  Discussed the following assessment and plan:  Breast lump -urgent diagnostic breast center referral - assistant to place orders  Essential hypertension - Plan: Basic metabolic panel, CBC -cont current meds, labs  Hypothyroidism, unspecified type - Plan: TSH -labs, cont med  Soreness breast -see above  -Patient advised to return or notify a doctor immediately if symptoms worsen or persist or new concerns arise.  Patient Instructions  BEFORE YOU LEAVE: -follow up: 3 months with Dr. Maudie Mercury -labs -urgent diagnostic mammo and [place] L breast  -We placed a referral for you as discussed. It usually takes about 1-2 weeks to process and schedule this referral. If you have not heard from [place] regarding this appointment in 2 weeks please contact our office.  We have ordered labs or studies at this visit. It can take up to 1-2 weeks for results and processing. IF results require follow up or explanation, we will call you with instructions. Clinically stable results will be released to your [facility]. If you have not heard from [place] or cannot find your results in [facility] in 2 weeks please contact our office at [phone].  If you are not yet signed up for [facility], please consider signing up.           Colin Benton R., DO

## 2016-08-04 ENCOUNTER — Encounter: Payer: Self-pay | Admitting: Family Medicine

## 2016-08-04 ENCOUNTER — Ambulatory Visit: Payer: Medicare Other

## 2016-08-04 ENCOUNTER — Encounter: Payer: Self-pay | Admitting: Gastroenterology

## 2016-08-04 ENCOUNTER — Ambulatory Visit (INDEPENDENT_AMBULATORY_CARE_PROVIDER_SITE_OTHER): Payer: Medicare Other | Admitting: Gastroenterology

## 2016-08-04 VITALS — BP 120/72 | HR 64 | Ht 69.0 in | Wt 173.2 lb

## 2016-08-04 DIAGNOSIS — R922 Inconclusive mammogram: Secondary | ICD-10-CM | POA: Diagnosis not present

## 2016-08-04 DIAGNOSIS — K219 Gastro-esophageal reflux disease without esophagitis: Secondary | ICD-10-CM

## 2016-08-04 DIAGNOSIS — N6321 Unspecified lump in the left breast, upper outer quadrant: Secondary | ICD-10-CM | POA: Diagnosis not present

## 2016-08-04 DIAGNOSIS — K582 Mixed irritable bowel syndrome: Secondary | ICD-10-CM

## 2016-08-04 MED ORDER — RANITIDINE HCL 150 MG PO TABS: 150.0000 mg | ORAL_TABLET | Freq: Every day | ORAL | Status: DC

## 2016-08-04 MED ORDER — GLYCOPYRROLATE 1 MG PO TABS: 1.0000 mg | ORAL_TABLET | Freq: Two times a day (BID) | ORAL | 11 refills | Status: DC

## 2016-08-04 NOTE — Progress Notes (Signed)
KIM, HANNAH R., DO  

## 2016-08-04 NOTE — Patient Instructions (Addendum)
We have sent the following medications to your pharmacy for you to pick up at your convenience:  Robinul 1 mg  Purchase Zantac 150 mg over the counter and take daily.  Patient advised to avoid spicy, acidic, citrus, chocolate, mints, fruit and fruit juices.  Limit the intake of caffeine, alcohol and Soda.  Don't exercise too soon after eating.  Don't lie down within 3-4 hours of eating.  Elevate the head of your bed.   Please follow up with [place] in 1 year.  Thank you for choosing me and [facility].  Pricilla Riffle. Dagoberto Ligas., MD., Marval Regal

## 2016-08-04 NOTE — Progress Notes (Signed)
I called Erica Marquez on [facility] to inquire about pt receiving PPSV 23 vaccine. They do not have any history of her receiving the vaccine there. They also checked the [facility] Registry and do not see it documented there either. Pt thinks she had the vaccine and thought it had been done at [facility] on [address].

## 2016-08-04 NOTE — Progress Notes (Signed)
    History of Present Illness: This is a 66 year old female with alternating bowel habits and lower abdominal pain. Lower abdominal pain is effectively but temporarily relieved with the use of hyoscyamine. Her bowels generally vary between normal and constipation however they are occasionally loose. She has not found any specific foods that trigger the symptoms. She takes Zantac OTC when necessary for reflux symptoms which are often bothersome several times per week.  Colonoscopy 03/2014: 1. Sessile polyp in the ascending colon; polypectomy performed with cold forceps 2. Mild diverticulosis in the descending colon 3. Grade l internal hemorrhoids  EGD 03/2014: 1. Schatzki ring at the gastroesophageal junction 2. Small hiatal hernia 3. Mild gastritis in the gastric body; multiple biopsies performed  Current Medications, Allergies, Past Medical History, Past Surgical History, Family History and Social History were reviewed in [facility] record.  Physical Exam: General: Well developed, well nourished, no acute distress Head: Normocephalic and atraumatic Eyes:  sclerae anicteric, EOMI Ears: Normal auditory acuity Mouth: No deformity or lesions Lungs: Clear throughout to auscultation Heart: Regular rate and rhythm; no murmurs, rubs or bruits Abdomen: Soft, non tender and non distended. No masses, hepatosplenomegaly or hernias noted. Normal Bowel sounds Musculoskeletal: Symmetrical with no gross deformities  Pulses:  Normal pulses noted Extremities: No clubbing, cyanosis, edema or deformities noted Neurological: Alert oriented x 4, grossly nonfocal Psychological:  Alert and cooperative. Normal mood and affect  Assessment and Recommendations:  1.  IBS-alternating pattern. Trial of a lactose free diet, no raw fruits, no raw vegetable diet. Eliminate diet sodas. Begin glycopyrrolate 1 mg twice a day. Patient is advised to contact us within 2 weeks if her symptoms are not  adequately controlled with this regimen. REV in 1 year.   2. GERD. Follow standard antireflux measures. Use ranitidine 150 mg daily for the next 6 weeks. If symptoms not adequately controlled consider increasing ranitidine dosing and frequency and consider a PPI. Patient is advised to contact us within 6 weeks if her symptoms are not under good control. REV in 1 year.  Denies weight loss, hange in stool caliber, melena, hematochezia, nausea, vomiting, dysphagia, chest pain.

## 2016-08-26 DIAGNOSIS — F329 Major depressive disorder, single episode, unspecified: Secondary | ICD-10-CM | POA: Diagnosis not present

## 2016-10-02 IMAGING — NM NM MISC PROCEDURE
6 series · 36 of 36 positions shown · non-contrast
Comparison: none

[Series 1: rest · 6.51mm/px · 6 of 64 frames shown]
[frame 6/64]
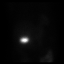
[frame 16/64]
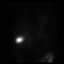
[frame 27/64]
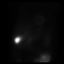
[frame 38/64]
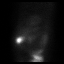
[frame 48/64]
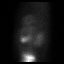
[frame 59/64]
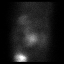

[Series 1: wbr_r-proj_st rest · 6.51mm/px · 6 of 64 frames shown]
[frame 6/64]
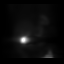
[frame 16/64]
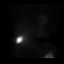
[frame 27/64]
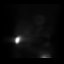
[frame 38/64]
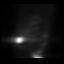
[frame 48/64]
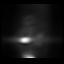
[frame 59/64]
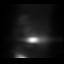

[Series 2: wbr_s-proj_st stress · 6.51mm/px · 6 of 512 frames shown (1 of 2)]
[frame 43/512]
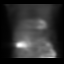
[frame 128/512]
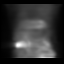
[frame 214/512]
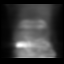
[frame 299/512]
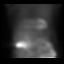
[frame 384/512]
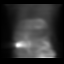
[frame 470/512]
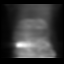

[Series 2: wbr_s-proj_st stress · 6.51mm/px · 6 of 64 frames shown (2 of 2)]
[frame 6/64]
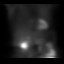
[frame 16/64]
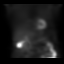
[frame 27/64]
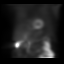
[frame 38/64]
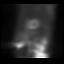
[frame 48/64]
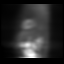
[frame 59/64]
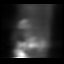

[Series 2: stress · 6.51mm/px · 6 of 512 frames shown (1 of 2)]
[frame 43/512]
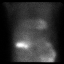
[frame 128/512]
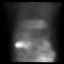
[frame 214/512]
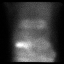
[frame 299/512]
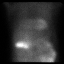
[frame 384/512]
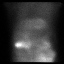
[frame 470/512]
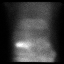

[Series 2: stress · 6.51mm/px · 6 of 64 frames shown (2 of 2)]
[frame 6/64]
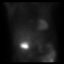
[frame 16/64]
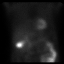
[frame 27/64]
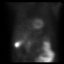
[frame 38/64]
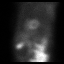
[frame 48/64]
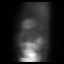
[frame 59/64]
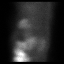

[36 of 36 positions shown; findings below may reference images not displayed]

Canned report from images found in remote index.

Refer to host system for actual result text.

## 2016-10-08 ENCOUNTER — Encounter: Payer: Self-pay | Admitting: Family Medicine

## 2016-10-23 ENCOUNTER — Ambulatory Visit (INDEPENDENT_AMBULATORY_CARE_PROVIDER_SITE_OTHER)
Admission: RE | Admit: 2016-10-23 | Discharge: 2016-10-23 | Disposition: A | Discharge status: Home / Self Care | Payer: Medicare Other | Source: Ambulatory Visit | Attending: Family Medicine | Admitting: Family Medicine

## 2016-10-23 DIAGNOSIS — Z780 Asymptomatic menopausal state: Secondary | ICD-10-CM

## 2016-10-26 DIAGNOSIS — L538 Other specified erythematous conditions: Secondary | ICD-10-CM | POA: Diagnosis not present

## 2016-10-26 DIAGNOSIS — L820 Inflamed seborrheic keratosis: Secondary | ICD-10-CM | POA: Diagnosis not present

## 2016-10-30 DIAGNOSIS — J301 Allergic rhinitis due to pollen: Secondary | ICD-10-CM | POA: Diagnosis not present

## 2016-10-30 DIAGNOSIS — J4530 Mild persistent asthma, uncomplicated: Secondary | ICD-10-CM | POA: Diagnosis not present

## 2016-10-30 DIAGNOSIS — H1045 Other chronic allergic conjunctivitis: Secondary | ICD-10-CM | POA: Diagnosis not present

## 2016-10-30 DIAGNOSIS — J3089 Other allergic rhinitis: Secondary | ICD-10-CM | POA: Diagnosis not present

## 2016-10-30 NOTE — Progress Notes (Signed)
HPI:  Erica Marquez is a pleasant 66 y.o. here for follow up. Chronic medical problems summarized below were reviewed for changes. She is doing well. She is doing Weight Watchers and has lost 15 pounds. She still struggles with her IBS and is seeing her gastroenterologist for this. She had questions about her bone density report. Denies CP, SOB, DOE, treatment intolerance or new symptoms.  Due for phq9, flu shot, mammo, pneumonia vaccine  Hypertension: -Medications:losartan -did not tolerate diuretic in the past  Hyperlipidemia: -Medications: pravastatin  Hypothyroidism: -Medications: levothyroxine  Asthma and allergies: -Medications: albuterol as needed, Symbicort, Zyrtec, Xyzal, Singulair  Acid reflux: -Medications: ranitidine  Depression: -Medications: Pristiq, trazodone  IBS: -sees GI  ROS: See pertinent positives and negatives per HPI.  Past Medical History:  Diagnosis Date  . Allergy   . Arthritis   . Asthma   . BACK PAIN, LUMBAR, WITH RADICULOPATHY 09/12/2007   Qualifier: Diagnosis of  By: Arnoldo Morale MD, John E   . Blunt head trauma   . Chest pain    s/p extensive nag cardiac eval with cath, stress test and sees Dr. Jens Som  . Depression   . Diverticulosis of colon   . Edema 08/12/2009   Qualifier: Diagnosis of  By: Arnoldo Morale MD, Balinda Quails   . Fibroids   . GERD (gastroesophageal reflux disease)    hiatal hernia  . Hyperlipidemia   . Hypertension   . Hypothyroidism 03/26/2014  . IBS (irritable bowel syndrome)   . Pelvic pain in female, chronic 10/24/2013  . Premature ventricular contractions   . Schatzki's ring    on EGD 2016  . Tubular adenoma of colon 03/2014    Past Surgical History:  Procedure Laterality Date  . BREAST SURGERY     fibroid tumors  . DILATION AND CURETTAGE OF UTERUS    . KNEE CARTILAGE SURGERY Right 03/2015   Torn miniscus    Family History  Problem Relation Age of Onset  . Coronary artery disease Mother   . Heart disease Mother   .  Colon polyps Mother   . Diabetes Mother   . Stomach cancer Father   . Colon cancer Neg Hx   . Kidney disease Neg Hx   . Esophageal cancer Neg Hx   . Gallbladder disease Neg Hx     Social History   Social History  . Marital status: Married    Spouse name: N/A  . Number of children: 2  . Years of education: N/A   Occupational History  . Housewife    Social History Main Topics  . Smoking status: Former Smoker    Packs/day: 0.50    Years: 20.00    Types: Cigarettes    Quit date: 01/20/1988  . Smokeless tobacco: Never Used     Comment: quit remotely  . Alcohol use 0.0 oz/week     Comment: very rare and a little wine  . Drug use: No  . Sexual activity: Yes   Other Topics Concern  . None   Social History Narrative   Work or School: retired - used to work in [place] Situation: lives with husband      Spiritual Beliefs: Christian      Lifestyle: walks and goes to the gym - diet is healthy              Current Outpatient Prescriptions:  .  albuterol (PROVENTIL HFA;VENTOLIN HFA) 108 (90 Base) MCG/ACT inhaler, Inhale 2 puffs into  the lungs every 6 (six) hours as needed. , Disp: , Rfl:  .  ammonium lactate (LAC-HYDRIN) 12 % lotion, Apply topically daily., Disp: , Rfl:  .  budesonide-formoterol (SYMBICORT) 160-4.5 MCG/ACT inhaler, Inhale 2 puffs into the lungs as needed. , Disp: , Rfl:  .  Cetirizine HCl (ZYRTEC ALLERGY PO), Take by mouth., Disp: , Rfl:  .  desvenlafaxine (PRISTIQ) 100 MG 24 hr tablet, 100 mg., Disp: , Rfl:  .  glycopyrrolate (ROBINUL) 1 MG tablet, Take 1 tablet (1 mg total) by mouth 2 (two) times daily., Disp: 60 tablet, Rfl: 11 .  Hyoscyamine Sulfate SL (LEVSIN/SL) 0.125 MG SUBL, Take 1-2 capsules by mouth every 4 hours as needed for abd pain, Disp: 120 each, Rfl: 11 .  levocetirizine (XYZAL) 5 MG tablet, Take 1 tablet by mouth daily., Disp: , Rfl: 6 .  losartan (COZAAR) 100 MG tablet, Take 1 tablet (100 mg total) by mouth daily.,  Disp: 90 tablet, Rfl: 3 .  magnesium oxide (MAG-OX) 400 MG tablet, Take 1 tablet (400 mg total) by mouth daily., Disp: 90 tablet, Rfl: 3 .  montelukast (SINGULAIR) 10 MG tablet, Take 10 mg by mouth at bedtime., Disp: , Rfl:  .  pravastatin (PRAVACHOL) 40 MG tablet, Take 1 tablet (40 mg total) by mouth daily., Disp: 90 tablet, Rfl: 0 .  Probiotic Product (ALIGN) 4 MG CAPS, Take by mouth., Disp: , Rfl:  .  ranitidine (ZANTAC) 150 MG tablet, Take 1 tablet (150 mg total) by mouth daily., Disp: , Rfl:  .  SYNTHROID 137 MCG tablet, TAKE ONE TABLET BY MOUTH ONE TIME DAILY BEFORE BREAKFAST, Disp: 90 tablet, Rfl: 0 .  traZODone (DESYREL) 50 MG tablet, Take 50 mg by mouth as needed for sleep. , Disp: , Rfl:   EXAM:  Vitals:   11/02/16 0934  BP: 116/80  Pulse: 64  Temp: 98.5 F (36.9 C)    Body mass index is 24.91 kg/m.  GENERAL: vitals reviewed and listed above, alert, oriented, appears well hydrated and in no acute distress  HEENT: atraumatic, conjunttiva clear, no obvious abnormalities on inspection of external nose and ears  NECK: no obvious masses on inspection  LUNGS: clear to auscultation bilaterally, no wheezes, rales or rhonchi, good air movement  CV: HRRR, no peripheral edema  MS: moves all extremities without noticeable abnormality  PSYCH: pleasant and cooperative, no obvious depression or anxiety  ASSESSMENT AND PLAN:  Discussed the following assessment and plan:  Essential hypertension  Hypothyroidism, unspecified type  Other irritable bowel syndrome  Allergic rhinitis, unspecified seasonality, unspecified trigger  Gastroesophageal reflux disease, esophagitis presence not specified  -congratulated on lifestyle changes and encouraged to continue -Vaccines counseling today and she is going to get her flu shot and her pneumonia booster -Reviewed her bone density report and findings with her -also talked about the following diet, elimination diets and treatment for  constipation -Follow up in 3-4 months -Patient advised to return or notify a doctor immediately if symptoms worsen or persist or new concerns arise.  Patient Instructions  BEFORE YOU LEAVE: -flu shot, PPSV23 booster -follow up: 3-4 months  Advise regular aerobic exercise (at least 150 minutes per week of sweaty exercise) and a healthy diet. Try to eat at least 5-9 servings of vegetables and fruits per day (not corn, potatoes or bananas.) Avoid sweets, red meat, pork, butter, fried foods, fast food, processed food, excessive dairy, eggs and coconut. Replace bad fats with good fats - fish, nuts and seeds, canola oil,  olive oil.   WE NOW OFFER   [facility] FAST TRACK!!!  SAME DAY Appointments for ACUTE CARE  Such as: Sprains, Injuries, cuts, abrasions, rashes, muscle pain, joint pain, back pain Colds, flu, sore throats, headache, allergies, cough, fever  Ear pain, sinus and eye infections Abdominal pain, nausea, vomiting, diarrhea, upset stomach Animal/insect bites  3 Easy Ways to Schedule: Walk-In Scheduling Call in scheduling Mychart Sign-up: [email or web address]           Colin Benton R., DO

## 2016-11-02 ENCOUNTER — Other Ambulatory Visit: Payer: Self-pay | Admitting: *Deleted

## 2016-11-02 ENCOUNTER — Ambulatory Visit (INDEPENDENT_AMBULATORY_CARE_PROVIDER_SITE_OTHER): Payer: Medicare Other | Admitting: Family Medicine

## 2016-11-02 ENCOUNTER — Encounter: Payer: Self-pay | Admitting: Family Medicine

## 2016-11-02 VITALS — BP 116/80 | HR 64 | Temp 98.5°F | Ht 69.0 in | Wt 168.7 lb

## 2016-11-02 DIAGNOSIS — K219 Gastro-esophageal reflux disease without esophagitis: Secondary | ICD-10-CM | POA: Diagnosis not present

## 2016-11-02 DIAGNOSIS — Z23 Encounter for immunization: Secondary | ICD-10-CM | POA: Diagnosis not present

## 2016-11-02 DIAGNOSIS — K588 Other irritable bowel syndrome: Secondary | ICD-10-CM | POA: Diagnosis not present

## 2016-11-02 DIAGNOSIS — E039 Hypothyroidism, unspecified: Secondary | ICD-10-CM | POA: Diagnosis not present

## 2016-11-02 DIAGNOSIS — J309 Allergic rhinitis, unspecified: Secondary | ICD-10-CM

## 2016-11-02 DIAGNOSIS — I10 Essential (primary) hypertension: Secondary | ICD-10-CM

## 2016-11-02 MED ORDER — LOSARTAN POTASSIUM 100 MG PO TABS: 100.0000 mg | ORAL_TABLET | Freq: Every day | ORAL | 1 refills | Status: DC

## 2016-11-02 MED ORDER — PRAVASTATIN SODIUM 40 MG PO TABS: 40.0000 mg | ORAL_TABLET | Freq: Every day | ORAL | 1 refills | Status: DC

## 2016-11-02 MED ORDER — SYNTHROID 137 MCG PO TABS: ORAL_TABLET | ORAL | 1 refills | Status: DC

## 2016-11-02 NOTE — Patient Instructions (Addendum)
BEFORE YOU LEAVE: -flu shot, PPSV23 booster -follow up: 3-4 months  Advise regular aerobic exercise (at least 150 minutes per week of sweaty exercise) and a healthy diet. Try to eat at least 5-9 servings of vegetables and fruits per day (not corn, potatoes or bananas.) Avoid sweets, red meat, pork, butter, fried foods, fast food, processed food, excessive dairy, eggs and coconut. Replace bad fats with good fats - fish, nuts and seeds, canola oil, olive oil.   WE NOW OFFER   [facility] FAST TRACK!!!  SAME DAY Appointments for ACUTE CARE  Such as: Sprains, Injuries, cuts, abrasions, rashes, muscle pain, joint pain, back pain Colds, flu, sore throats, headache, allergies, cough, fever  Ear pain, sinus and eye infections Abdominal pain, nausea, vomiting, diarrhea, upset stomach Animal/insect bites  3 Easy Ways to Schedule: Walk-In Scheduling Call in scheduling Mychart Sign-up: [email or web address]

## 2016-11-02 NOTE — Addendum Note (Signed)
Addended by: Agnes Lawrence on: 11/02/2016 11:58 AM   Modules accepted: Orders

## 2016-11-02 NOTE — Telephone Encounter (Signed)
Rx done. 

## 2016-11-02 NOTE — Addendum Note (Signed)
Addended by: Agnes Lawrence on: 11/02/2016 10:06 AM   Modules accepted: Orders

## 2016-11-24 DIAGNOSIS — B079 Viral wart, unspecified: Secondary | ICD-10-CM | POA: Diagnosis not present

## 2016-11-24 DIAGNOSIS — D485 Neoplasm of uncertain behavior of skin: Secondary | ICD-10-CM | POA: Diagnosis not present

## 2016-12-08 DIAGNOSIS — K589 Irritable bowel syndrome without diarrhea: Secondary | ICD-10-CM | POA: Insufficient documentation

## 2016-12-08 DIAGNOSIS — F32A Depression, unspecified: Secondary | ICD-10-CM | POA: Insufficient documentation

## 2016-12-08 DIAGNOSIS — S098XXA Other specified injuries of head, initial encounter: Secondary | ICD-10-CM | POA: Insufficient documentation

## 2016-12-08 DIAGNOSIS — D219 Benign neoplasm of connective and other soft tissue, unspecified: Secondary | ICD-10-CM | POA: Insufficient documentation

## 2016-12-08 DIAGNOSIS — E785 Hyperlipidemia, unspecified: Secondary | ICD-10-CM | POA: Insufficient documentation

## 2016-12-08 DIAGNOSIS — M1990 Unspecified osteoarthritis, unspecified site: Secondary | ICD-10-CM | POA: Insufficient documentation

## 2016-12-08 DIAGNOSIS — I10 Essential (primary) hypertension: Secondary | ICD-10-CM | POA: Insufficient documentation

## 2016-12-08 DIAGNOSIS — F329 Major depressive disorder, single episode, unspecified: Secondary | ICD-10-CM | POA: Insufficient documentation

## 2016-12-08 DIAGNOSIS — I493 Ventricular premature depolarization: Secondary | ICD-10-CM | POA: Insufficient documentation

## 2016-12-08 DIAGNOSIS — T7840XA Allergy, unspecified, initial encounter: Secondary | ICD-10-CM | POA: Insufficient documentation

## 2016-12-08 DIAGNOSIS — R079 Chest pain, unspecified: Secondary | ICD-10-CM | POA: Insufficient documentation

## 2016-12-08 DIAGNOSIS — K222 Esophageal obstruction: Secondary | ICD-10-CM | POA: Insufficient documentation

## 2016-12-08 DIAGNOSIS — K5730 Diverticulosis of large intestine without perforation or abscess without bleeding: Secondary | ICD-10-CM | POA: Insufficient documentation

## 2016-12-08 DIAGNOSIS — K219 Gastro-esophageal reflux disease without esophagitis: Secondary | ICD-10-CM | POA: Insufficient documentation

## 2016-12-14 DIAGNOSIS — Y998 Other external cause status: Secondary | ICD-10-CM | POA: Diagnosis not present

## 2016-12-14 DIAGNOSIS — R42 Dizziness and giddiness: Secondary | ICD-10-CM | POA: Diagnosis not present

## 2016-12-14 DIAGNOSIS — R404 Transient alteration of awareness: Secondary | ICD-10-CM | POA: Diagnosis not present

## 2016-12-14 DIAGNOSIS — R55 Syncope and collapse: Secondary | ICD-10-CM | POA: Diagnosis not present

## 2016-12-14 DIAGNOSIS — I493 Ventricular premature depolarization: Secondary | ICD-10-CM | POA: Diagnosis not present

## 2016-12-14 DIAGNOSIS — Z9889 Other specified postprocedural states: Secondary | ICD-10-CM | POA: Diagnosis not present

## 2016-12-14 DIAGNOSIS — R001 Bradycardia, unspecified: Secondary | ICD-10-CM | POA: Diagnosis not present

## 2016-12-14 DIAGNOSIS — W01198A Fall on same level from slipping, tripping and stumbling with subsequent striking against other object, initial encounter: Secondary | ICD-10-CM | POA: Diagnosis not present

## 2016-12-14 DIAGNOSIS — I10 Essential (primary) hypertension: Secondary | ICD-10-CM | POA: Diagnosis not present

## 2016-12-14 DIAGNOSIS — Y9301 Activity, walking, marching and hiking: Secondary | ICD-10-CM | POA: Diagnosis not present

## 2016-12-14 DIAGNOSIS — Y92002 Bathroom of unspecified non-institutional (private) residence single-family (private) house as the place of occurrence of the external cause: Secondary | ICD-10-CM | POA: Diagnosis not present

## 2016-12-14 DIAGNOSIS — Z9181 History of falling: Secondary | ICD-10-CM | POA: Diagnosis not present

## 2016-12-14 DIAGNOSIS — Z79899 Other long term (current) drug therapy: Secondary | ICD-10-CM | POA: Diagnosis not present

## 2016-12-21 ENCOUNTER — Ambulatory Visit (INDEPENDENT_AMBULATORY_CARE_PROVIDER_SITE_OTHER): Payer: Medicare Other | Admitting: Internal Medicine

## 2016-12-21 ENCOUNTER — Encounter: Payer: Self-pay | Admitting: Internal Medicine

## 2016-12-21 VITALS — BP 124/82 | HR 68 | Ht 69.0 in | Wt 168.0 lb

## 2016-12-21 DIAGNOSIS — I493 Ventricular premature depolarization: Secondary | ICD-10-CM | POA: Diagnosis not present

## 2016-12-21 NOTE — Progress Notes (Signed)
Patient Care Team: [name], DO as PCP - General (Family Medicine) Deboraha Sprang, MD as PCP - Cardiology (Cardiology)   HPI  Erica Marquez is a 66 y.o. female seen in followup for RV-INFLOW TRACT  PVCs and Hypertension   She also carries a history of atrial fibrillation that I had given her associated with popcorn sensations. After reviewing all that i could find with her name on it i could find nothing with atrial fibrillation-- i dont know how I made the error    PVCs much improved in the absence of a diuretic and ongoing magnesium support\   Her lipids are quite anomalous with a change in her LDL over the last year from 119--194   Date K TC/LDL  1/18   191/107  7/18 4.2    11/18 4.3    She had syncope about a week ago.  Looking back she been having symptoms for 2 or 3 weeks of fatigue and lightheadedness.  This was unassociated with a vertiginous sensation.  It seemed to actually be worse sitting than standing.  She started noting that on her smart phone her heart rate was about 45 as opposed to her normal 65 or 70.  After her syncopal episode she was transported to [facility].  Evaluation included CT scanning that was normal echocardiogram that was normal.  She was given a ZIO patch  Past Medical History:  Diagnosis Date  . Allergy   . Arthritis   . Asthma   . BACK PAIN, LUMBAR, WITH RADICULOPATHY 09/12/2007   Qualifier: Diagnosis of  By: Arnoldo Morale MD, John E   . Blunt head trauma   . Chest pain    s/p extensive nag cardiac eval with cath, stress test and sees Dr. Jens Som  . Depression   . Diverticulosis of colon   . Edema 08/12/2009   Qualifier: Diagnosis of  By: Arnoldo Morale MD, Balinda Quails   . Fibroids   . GERD (gastroesophageal reflux disease)    hiatal hernia  . Hyperlipidemia   . Hypertension   . Hypothyroidism 03/26/2014  . IBS (irritable bowel syndrome)   . Pelvic pain in female, chronic 10/24/2013  . Premature ventricular contractions   . Schatzki's ring    on EGD 2016   . Tubular adenoma of colon 03/2014    Past Surgical History:  Procedure Laterality Date  . BREAST SURGERY     fibroid tumors  . DILATION AND CURETTAGE OF UTERUS    . KNEE CARTILAGE SURGERY Right 03/2015   Torn miniscus    Current Outpatient Medications  Medication Sig Dispense Refill  . albuterol (PROVENTIL HFA;VENTOLIN HFA) 108 (90 Base) MCG/ACT inhaler Inhale 2 puffs into the lungs every 6 (six) hours as needed.     Marland Kitchen ammonium lactate (LAC-HYDRIN) 12 % lotion Apply topically daily.    . budesonide-formoterol (SYMBICORT) 160-4.5 MCG/ACT inhaler Inhale 2 puffs into the lungs as needed.     . Cetirizine HCl (ZYRTEC ALLERGY PO) Take by mouth.    . desvenlafaxine (PRISTIQ) 100 MG 24 hr tablet 100 mg.    . losartan (COZAAR) 100 MG tablet Take 1 tablet (100 mg total) by mouth daily. 90 tablet 1  . magnesium oxide (MAG-OX) 400 MG tablet Take 1 tablet (400 mg total) by mouth daily. 90 tablet 3  . montelukast (SINGULAIR) 10 MG tablet Take 10 mg by mouth at bedtime.    . pravastatin (PRAVACHOL) 40 MG tablet Take 1 tablet (40 mg total) by mouth daily.  90 tablet 1  . Probiotic Product (ALIGN) 4 MG CAPS Take by mouth.    . ranitidine (ZANTAC) 150 MG tablet Take 1 tablet (150 mg total) by mouth daily.    Marland Kitchen SYNTHROID 137 MCG tablet TAKE ONE TABLET BY MOUTH ONE TIME DAILY BEFORE BREAKFAST 90 tablet 1  . traZODone (DESYREL) 50 MG tablet Take 50 mg by mouth as needed for sleep.      No current facility-administered medications for this visit.     No Known Allergies  Review of Systems negative except from HPI and PMH  Physical Exam BP 124/82   Pulse 68   Ht 5\' 9"  (1.753 m)   Wt 168 lb (76.2 kg)   BMI 24.81 kg/m   Well developed and nourished in no acute distress HENT normal Neck supple with JVP-flat Clear Irregular rate and rhythm, no murmurs or gallops Abd-soft with active BS No Clubbing cyanosis edema Skin-warm and dry A & Oriented  Grossly normal sensory and motor  function      ECG demonstrates sinus rhythm at 68 Interval 15/08/42 PVC left bundle superior axis  All ECGs back to 2010 were reviewed without evidence of PVCs.  Event recorder 2012 demonstrated PVCs with a similar morphology what is presumed to be the inferior lead 2  1 minute rhythm strip was recorded.  There were no PVCs but infrequent PACs  Assessment and  Plan  RVIT-PVCs  PACs  Hypertension  Hyperlipidemia  Syncope    The patient's syncopal episode and her noting irregularity of heart rhythm suggests a causal relationship.  In addition, her symptoms over the last couple of weeks she is reproducibly noted with modestly slow heart rates suggesting a causal relationship.  I wonder whether this is ectopy masquerading as bradycardia.  I have encouraged her to activate her monitor liberally.  The absence of more definitive information I am loath to initiate therapy.  In the event however that we implicate the PACs, they being more prevalent than the PVCs, antiarrhythmic suppression might be of value  Blood pressures well controlled  Lipids need reassessment  More than 50% of 40 min was spent in counseling related to the above

## 2016-12-21 NOTE — Patient Instructions (Addendum)
Medication Instructions:  Your physician recommends that you continue on your current medications as directed. Please refer to the Current Medication list given to you today.  * If you need a refill on your cardiac medications before your next appointment, please call your pharmacy. *  Labwork: None ordered  Testing/Procedures: None ordered  Follow-Up: To be determined once Zio patch monitor is completed and reviewed by Dr. Caryl Comes.  The office will contact you once he has looked at the results.  Thank you for choosing [facility]!!    Any Other Special Instructions Will Be Listed Below (If Applicable).

## 2017-01-04 DIAGNOSIS — R55 Syncope and collapse: Secondary | ICD-10-CM | POA: Diagnosis not present

## 2017-01-07 ENCOUNTER — Telehealth: Payer: Self-pay | Admitting: Internal Medicine

## 2017-01-07 NOTE — Telephone Encounter (Signed)
New message   Patient calling, states Dr Caryl Comes told her he was going to review heart monitor results from [facility] in [place].  Please advise

## 2017-01-07 NOTE — Telephone Encounter (Signed)
Patient calling and states that she was suppose to call and remind Dr. Caryl Comes a week after she turned in her heart monitor at [place] patient patient aware that I did not see the results in [place] as of yet. Made patient aware that I would forward the message to Dr. Caryl Comes and make him aware to be on the lookout. Patient thanked me for the call.

## 2017-01-14 NOTE — Telephone Encounter (Signed)
We got a call about the monitor--- can she call [facility] and get them to send it to [place] thnks

## 2017-01-15 ENCOUNTER — Telehealth: Payer: Self-pay | Admitting: Internal Medicine

## 2017-01-15 ENCOUNTER — Telehealth: Payer: Self-pay | Admitting: Family Medicine

## 2017-01-15 NOTE — Telephone Encounter (Signed)
New message   Pt verbalized that the medical records on her side is giving her a very hard time to release the records

## 2017-01-15 NOTE — Telephone Encounter (Signed)
Pt contacted [facility] about getting monitor results faxed to our office.  Pt states they are being complicated and said she has to submit something in writing.  Advised pt I would send message to our medical records dept and see if we can do anything differently from our side to get these records.  Pt appreciative for assistance.

## 2017-01-15 NOTE — Telephone Encounter (Unsigned)
Copied from [place]. Topic: Inquiry >> Jan 15, 2017  4:24 PM Neva Seat wrote: Pt called to check if office received recent paperwork for pt from [facility] regarding a heart monitor.  Pt is waiting for a call back to verify with her the paperwork was received.

## 2017-01-15 NOTE — Telephone Encounter (Signed)
Called and made patient aware of Dr. Olin Pia instructions for her to have [facility] fax [place] the results of the monitor. Fax number provided. Patient verbalized understanding and thanked me for the call.

## 2017-01-15 NOTE — Telephone Encounter (Unsigned)
Copied from [place]. >> Jan 15, 2017  4:24 PM Neva Seat wrote: Pt called to check if office received recent paperwork for pt from [facility] regarding a heart monitor.  Pt is waiting for a call back to verify with her the paperwork was received.

## 2017-01-18 NOTE — Telephone Encounter (Signed)
Letterhead faxed over to [facility] to see if I can obtain Monitor Report.

## 2017-01-20 NOTE — Telephone Encounter (Signed)
I left a detailed message we have not received a fax as of today and left our fax number for her to have [facility] send this to.

## 2017-01-25 NOTE — Telephone Encounter (Signed)
F/u Message  Butch Penny from [facility] cardiology heart center call requesting to speak with RN about sending the report to Dr. Olin Pia office. She asked to email the information because they are having issues with their fax machine. She did state she was going to fax it to see if it would work. Fax sent to [phone]. If not receive she ask for a call back

## 2017-01-25 NOTE — Telephone Encounter (Signed)
Records received. Taken to chart prep.

## 2017-01-26 ENCOUNTER — Telehealth: Payer: Self-pay | Admitting: Internal Medicine

## 2017-01-26 NOTE — Telephone Encounter (Signed)
New Message  Pt call requesting to speak with RN to see if the results from pts heart monitor were received.

## 2017-01-26 NOTE — Telephone Encounter (Signed)
Spoke with pt and made her aware that monitor has been received.  Advised it has been placed on Dr. Olin Pia cart for him to review once he returns to the office.  Pt appreciative for call.

## 2017-01-28 ENCOUNTER — Encounter: Payer: Self-pay | Admitting: Family Medicine

## 2017-01-28 ENCOUNTER — Encounter: Payer: Self-pay | Admitting: Internal Medicine

## 2017-01-28 ENCOUNTER — Telehealth: Payer: Self-pay | Admitting: Internal Medicine

## 2017-01-28 NOTE — Telephone Encounter (Signed)
New Message   Patient is calling about her test results from her heart monitor. Please call.

## 2017-01-28 NOTE — Progress Notes (Signed)
ZIO patch monitor from [facility] was reviewed.  She has multiple symptom triggered recordings.  They are all associated with ectopic beats, mostly ventricular but occasionally supraventricular.  There are at least 2 distinct morphologies and perhaps a third with a similar appearance to the dominant morphology expected to look like this based on her known right ventricular outflow tract ectopy.  She had some nonsustained atrial tachycardia on 12/7 the afternoon recordings there was pausing and rates into the low 40s.  These were associated with some junctional rhythm  There are also episodes of junctional rhythm  At 10 PM on 12/9 there was a 3-second pause preceded by sinus slowing.?  While sleeping     Symptoms of pounding were associated with an isolated PVC Symptoms of fluttering were associated with sinus rhythm.    Symptoms of Hot flash and skipped beats were associated with sinus rhythm and junctional rhythm.  Probably worth arranging his visit to go over the strips together

## 2017-01-29 NOTE — Telephone Encounter (Signed)
Called patient and made patient aware that Dr. Caryl Comes has reviewed her monitor from [facility] (see Documentation note from Dr. Caryl Comes on 1/10) and recommended that she be scheduled an OV with him to discuss results. First available appointment made for patient to see SK on 1/29 at 2:45 PM. Patient verbalized understanding and was in agreement with the plan.

## 2017-01-29 NOTE — Telephone Encounter (Signed)
New message  Pt verbalized that she is calling for RN  For the results for the monitor

## 2017-02-01 ENCOUNTER — Telehealth: Payer: Self-pay | Admitting: Internal Medicine

## 2017-02-01 NOTE — Telephone Encounter (Signed)
Informed patient that Dr. Caryl Comes is out for two weeks. Patient stated she was suppose to have an answer from the nurse she talked to last week. Patient's monitor results from [facility] were reviewed by Dr. Caryl Comes. Patient was made an appointment with Dr. Caryl Comes for 1/29 to go over monitor results. Patient stated that the nurse was going to ask Dr. Caryl Comes if she could drive and go to the gym. Will send message to the nurse that talked to Dr. Caryl Comes last week, to see if she knows what Dr. Caryl Comes advised.

## 2017-02-01 NOTE — Telephone Encounter (Signed)
Left message for patient to call back  

## 2017-02-01 NOTE — Telephone Encounter (Signed)
New Message     Patient would like to know if she can resume driving and going to the gym ?  Does she have any restrictions ?

## 2017-02-02 NOTE — Telephone Encounter (Signed)
Called and made patient aware that she cannot drive for 6 months after her syncopal episode. Made patient aware that per Dr. Olin Pia colleague, Dr. Lovena Le, the patient can exercise. Patient verbalized understanding and thanked me for the call.

## 2017-02-16 ENCOUNTER — Ambulatory Visit (INDEPENDENT_AMBULATORY_CARE_PROVIDER_SITE_OTHER): Payer: Medicare Other | Admitting: Internal Medicine

## 2017-02-16 ENCOUNTER — Encounter: Payer: Self-pay | Admitting: Internal Medicine

## 2017-02-16 VITALS — BP 116/76 | HR 62 | Ht 69.0 in | Wt 171.5 lb

## 2017-02-16 DIAGNOSIS — I472 Ventricular tachycardia, unspecified: Secondary | ICD-10-CM

## 2017-02-16 DIAGNOSIS — I493 Ventricular premature depolarization: Secondary | ICD-10-CM

## 2017-02-16 MED ORDER — LOSARTAN POTASSIUM 50 MG PO TABS: 50.0000 mg | ORAL_TABLET | Freq: Every day | ORAL | 3 refills | Status: DC

## 2017-02-16 NOTE — Progress Notes (Signed)
Patient Care Team: [name], DO as PCP - General (Family Medicine) Deboraha Sprang, MD as PCP - Cardiology (Cardiology)   HPI  Erica Marquez is a 67 y.o. female seen in followup for RV-INFLOW TRACT  PVCs and Hypertension   She also carries a history of atrial fibrillation that I had given her associated with popcorn sensations. After reviewing all that i could find with her name on it i could find nothing with atrial fibrillation-- i dont know how I made the error    PVCs have been much better for some time in the absence of a diuretic and ongoing magnesium.  \   Her lipids are quite anomalous with a change in her LDL over the last year from 119--194   Date K TC/LDL  1/18   191/107  7/18 4.2    11/18 4.3    She had syncope about a week ago.  The episode occurred after having gone to the toilet.  She was unable to defecate.  He came back towards her bed.  She felt warm.  She fell.  Her husband awakened and found her on the floor.  He does not think that she was pale.  She started talking.  She asked for water.  He went to the kitchen by the time he came back she was unconscious again and had not moved.  EMS was called.  Looking back she been having symptoms for 2 or 3 weeks of fatigue and lightheadedness.  This was unassociated with a vertiginous sensation.  It seemed to actually be worse sitting than standing.  She started noting that on her smart phone her heart rate was about 45 as opposed to her normal 65 or 70.  After her syncopal episode she was transported to [facility].  Evaluation included CT scanning that was normal echocardiogram that was normal.  She was given a ZIO patch the results of which were reviewed today.   They demonstrated symptomatic PVCs, symptomatic junctional rhythm, she has sinus pauses of greater than 3 seconds and sinus bradycardia into the 30s during waking hours.  She had nonsustained atrial tachycardia.    Past Medical History:  Diagnosis Date  .  Allergy   . Arthritis   . Asthma   . BACK PAIN, LUMBAR, WITH RADICULOPATHY 09/12/2007   Qualifier: Diagnosis of  By: Arnoldo Morale MD, John E   . Blunt head trauma   . Chest pain    s/p extensive nag cardiac eval with cath, stress test and sees Dr. Jens Som  . Depression   . Diverticulosis of colon   . Edema 08/12/2009   Qualifier: Diagnosis of  By: Arnoldo Morale MD, Balinda Quails   . Fibroids   . GERD (gastroesophageal reflux disease)    hiatal hernia  . Hyperlipidemia   . Hypertension   . Hypothyroidism 03/26/2014  . IBS (irritable bowel syndrome)   . Pelvic pain in female, chronic 10/24/2013  . Premature ventricular contractions   . Schatzki's ring    on EGD 2016  . Tubular adenoma of colon 03/2014    Past Surgical History:  Procedure Laterality Date  . BREAST SURGERY     fibroid tumors  . DILATION AND CURETTAGE OF UTERUS    . KNEE CARTILAGE SURGERY Right 03/2015   Torn miniscus    Current Outpatient Medications  Medication Sig Dispense Refill  . albuterol (PROVENTIL HFA;VENTOLIN HFA) 108 (90 Base) MCG/ACT inhaler Inhale 2 puffs into the lungs every 6 (six) hours as needed.     Marland Kitchen  ammonium lactate (LAC-HYDRIN) 12 % lotion Apply topically daily.    . budesonide-formoterol (SYMBICORT) 160-4.5 MCG/ACT inhaler Inhale 2 puffs into the lungs as needed.     . Cetirizine HCl (ZYRTEC ALLERGY PO) Take by mouth.    . desvenlafaxine (PRISTIQ) 100 MG 24 hr tablet 100 mg.    . losartan (COZAAR) 100 MG tablet Take 1 tablet (100 mg total) by mouth daily. 90 tablet 1  . magnesium oxide (MAG-OX) 400 MG tablet Take 1 tablet (400 mg total) by mouth daily. 90 tablet 3  . montelukast (SINGULAIR) 10 MG tablet Take 10 mg by mouth at bedtime.    . pravastatin (PRAVACHOL) 40 MG tablet Take 1 tablet (40 mg total) by mouth daily. 90 tablet 1  . Probiotic Product (ALIGN) 4 MG CAPS Take by mouth.    . SYNTHROID 137 MCG tablet TAKE ONE TABLET BY MOUTH ONE TIME DAILY BEFORE BREAKFAST 90 tablet 1  . traZODone (DESYREL) 50 MG  tablet Take 50 mg by mouth as needed for sleep.      No current facility-administered medications for this visit.     No Known Allergies  Review of Systems negative except from HPI and PMH  Physical Exam BP 116/76   Pulse 62   Ht 5\' 9"  (1.753 m)   Wt 171 lb 8 oz (77.8 kg)   SpO2 96%   BMI 25.33 kg/m   Well developed and nourished in no acute distress HENT normal Neck supple with JVP-flat Carotids brisk and full without bruits Clear Regular rate and rhythm, no murmurs or gallops Abd-soft with active BS without hepatomegaly No Clubbing cyanosis edema Skin-warm and dry A & Oriented  Grossly normal sensory and motor function   ECG 64 15/08/42     Assessment and  Plan   PVCs 2 distinct morphologies noted on the ZIO patch  Ventricular tachycardia-nonsustained  PACs nonsustained atrial tachycardia  Junctional rhythm  Hypertension    Syncope  The zio patch results were reviewed with the patient and her husband.   While the syncopal episode that has features suggestive of a neurally mediated reflex, specifically antecedent warmth, the lack of pallor and the recurrent syncope without presumed changes in position, although she might have tried to sit up, and in the context of the arrhythmias noted make a diagnosis of reflex syncope harder to make.  The implications of her ventricular arrhythmia would derive from structural heart disease.  We will undertake a cardiac MRI and then consider signal-averaged ECG In the Event that there is no structural heart disease then symptomatic management would be appropriate.  This would likely require antiarrhythmic drugs given the multitude of her arrhythmias and given her propensity to bradycardia  backup bradycardia pacing  I agree that her driving should be restricted given the unknown mechanism.  Implantation of a LINQ recorder might be of benefit in the event that we do not undertake device implantation  More than 50% of 45 min was  spent in counseling related to the above

## 2017-02-16 NOTE — Patient Instructions (Addendum)
Medication Instructions:  Your physician has recommended you make the following change in your medication:   --Decrease your Losartan to 50mg  daily.   Labwork: None ordered.  Testing/Procedures:  Your physician has requested that you have a cardiac MRI. Cardiac MRI uses a computer to create images of your heart as its beating, producing both still and moving pictures of your heart and major blood vessels. For further information please visit [email or web address] Please follow the instruction sheet given to you today for more information.  Please schedule a Cardiac MRI   Follow-Up: Your physician recommends that you schedule a follow-up based off your MRI results.   Any Other Special Instructions Will Be Listed Below (If Applicable).     If you need a refill on your cardiac medications before your next appointment, please call your pharmacy.

## 2017-02-22 ENCOUNTER — Encounter: Payer: Self-pay | Admitting: Internal Medicine

## 2017-02-22 ENCOUNTER — Telehealth: Payer: Self-pay | Admitting: Internal Medicine

## 2017-02-22 NOTE — Telephone Encounter (Signed)
Called the patient and LVM with date, time and location of cardiac MRI.  Message to Dr. Olin Pia nurse and letter mailed to the patient.

## 2017-02-28 NOTE — Progress Notes (Signed)
HPI:  Erica Marquez is a pleasant 67 y.o. here for follow up. Chronic medical problems summarized below were reviewed for changes and stability and were updated as needed below. See new issue of syncope and arrhythmias.  Reports she has been doing well recently without any further syncope, chest pain, shortness of breath or sustained palpitations.  She does feel some "PVCs" at times. Denies CP, SOB, DOE, treatment intolerance or new symptoms.  Due for labs, mammo, ? WEAN trazadone, zrytec given syncope and arrhythmias. Reports she had her mammogram at [facility]. Reports she does not take Zyrtec. Reports she takes the trazodone 2-3 nights per week.  PVCs/PACs/Ventricular tachycardia/Bradycardia/Syncope 2019/HTN: -s/p evaluation at [facility] and now seeing Dr. Caryl Comes for managment Hypertension: -Medications: losartan -did not tolerate diuretic in the past   Hyperlipidemia: -Medications: pravastatin  Hypothyroidism: -Medications: levothyroxine  Asthma and allergies: -Medications: albuterol as needed, Symbicort, Zyrtec, Xyzal, Singulair  Acid reflux: -Medications: ranitidine  Depression: -Medications: Pristiq, trazodone  IBS: -sees GI  ROS: See pertinent positives and negatives per HPI.  Past Medical History:  Diagnosis Date  . Allergy   . Arthritis   . Asthma   . BACK PAIN, LUMBAR, WITH RADICULOPATHY 09/12/2007   Qualifier: Diagnosis of  By: Arnoldo Morale MD, John E   . Blunt head trauma   . Chest pain    s/p extensive nag cardiac eval with cath, stress test and sees Dr. Jens Som  . Depression   . Diverticulosis of colon   . Edema 08/12/2009   Qualifier: Diagnosis of  By: Arnoldo Morale MD, Balinda Quails   . Fibroids   . GERD (gastroesophageal reflux disease)    hiatal hernia  . Hyperlipidemia   . Hypertension   . Hypothyroidism 03/26/2014  . IBS (irritable bowel syndrome)   . Pelvic pain in female, chronic 10/24/2013  . Premature ventricular contractions   . Schatzki's ring    on  EGD 2016  . Tubular adenoma of colon 03/2014    Past Surgical History:  Procedure Laterality Date  . BREAST SURGERY     fibroid tumors  . DILATION AND CURETTAGE OF UTERUS    . KNEE CARTILAGE SURGERY Right 03/2015   Torn miniscus    Family History  Problem Relation Age of Onset  . Coronary artery disease Mother   . Heart disease Mother   . Colon polyps Mother   . Diabetes Mother   . Stomach cancer Father   . Colon cancer Neg Hx   . Kidney disease Neg Hx   . Esophageal cancer Neg Hx   . Gallbladder disease Neg Hx     Social History   Socioeconomic History  . Marital status: Married    Spouse name: None  . Number of children: 2  . Years of education: None  . Highest education level: None  Social Needs  . Financial resource strain: None  . Food insecurity - worry: None  . Food insecurity - inability: None  . Transportation needs - medical: None  . Transportation needs - non-medical: None  Occupational History  . Occupation: Housewife  Tobacco Use  . Smoking status: Former Smoker    Packs/day: 0.50    Years: 20.00    Pack years: 10.00    Types: Cigarettes    Last attempt to quit: 01/20/1988    Years since quitting: 29.1  . Smokeless tobacco: Never Used  . Tobacco comment: quit remotely  Substance and Sexual Activity  . Alcohol use: Yes    Alcohol/week: 0.0  oz    Comment: very rare and a little wine  . Drug use: No  . Sexual activity: Yes  Other Topics Concern  . None  Social History Narrative   Work or School: retired - used to work in [place] Situation: lives with husband      Spiritual Beliefs: Christian      Lifestyle: walks and goes to the gym - diet is healthy              Current Outpatient Medications:  .  albuterol (PROVENTIL HFA;VENTOLIN HFA) 108 (90 Base) MCG/ACT inhaler, Inhale 2 puffs into the lungs every 6 (six) hours as needed. , Disp: , Rfl:  .  ammonium lactate (LAC-HYDRIN) 12 % lotion, Apply topically daily.,  Disp: , Rfl:  .  budesonide-formoterol (SYMBICORT) 160-4.5 MCG/ACT inhaler, Inhale 2 puffs into the lungs as needed. , Disp: , Rfl:  .  desvenlafaxine (PRISTIQ) 100 MG 24 hr tablet, 100 mg., Disp: , Rfl:  .  losartan (COZAAR) 50 MG tablet, Take 1 tablet (50 mg total) by mouth daily., Disp: 90 tablet, Rfl: 3 .  magnesium oxide (MAG-OX) 400 MG tablet, Take 1 tablet (400 mg total) by mouth daily., Disp: 90 tablet, Rfl: 3 .  montelukast (SINGULAIR) 10 MG tablet, Take 10 mg by mouth at bedtime., Disp: , Rfl:  .  pravastatin (PRAVACHOL) 40 MG tablet, Take 1 tablet (40 mg total) by mouth daily., Disp: 90 tablet, Rfl: 1 .  Probiotic Product (ALIGN) 4 MG CAPS, Take by mouth., Disp: , Rfl:  .  SYNTHROID 137 MCG tablet, TAKE ONE TABLET BY MOUTH ONE TIME DAILY BEFORE BREAKFAST, Disp: 90 tablet, Rfl: 1 .  traZODone (DESYREL) 50 MG tablet, Take 50 mg by mouth as needed for sleep. , Disp: , Rfl:   EXAM:  Vitals:   03/01/17 0852  BP: (!) 100/56  Pulse: (!) 53  Temp: 97.9 F (36.6 C)    Body mass index is 25.28 kg/m.  GENERAL: vitals reviewed and listed above, alert, oriented, appears well hydrated and in no acute distress  HEENT: atraumatic, conjunttiva clear, no obvious abnormalities on inspection of external nose and ears  NECK: no obvious masses on inspection  LUNGS: clear to auscultation bilaterally, no wheezes, rales or rhonchi, good air movement  CV: HRRR, no peripheral edema  MS: moves all extremities without noticeable abnormality  PSYCH: pleasant and cooperative, no obvious depression or anxiety  ASSESSMENT AND PLAN:  Discussed the following assessment and plan:  Essential hypertension - Plan: Basic metabolic panel, CBC  Hyperlipidemia, unspecified hyperlipidemia type - Plan: Lipid panel  Hypothyroidism, unspecified type - Plan: TSH  Cardiac arrhythmia, unspecified cardiac arrhythmia type  -Labs -Opted to wean off of the trazodone light of the recent arrhythmias, she only  takes it a few days a week -She is now seeing her cardiologist for the blood pressure and the heart issues   Patient Instructions  BEFORE YOU LEAVE: -Labs -follow up: 3-4 months  Stop the trazodone.  We have ordered labs or studies at this visit. It can take up to 1-2 weeks for results and processing. IF results require follow up or explanation, we will call you with instructions. Clinically stable results will be released to your [facility]. If you have not heard from [place] or cannot find your results in [facility] in 2 weeks please contact our office at [phone].  If you are not yet signed up for [facility], please consider signing up.  Rudy Domek R Rakeya Glab, DO   

## 2017-03-01 ENCOUNTER — Ambulatory Visit (INDEPENDENT_AMBULATORY_CARE_PROVIDER_SITE_OTHER): Payer: Medicare Other | Admitting: Family Medicine

## 2017-03-01 ENCOUNTER — Encounter: Payer: Self-pay | Admitting: Family Medicine

## 2017-03-01 VITALS — BP 100/56 | HR 53 | Temp 97.9°F | Ht 69.0 in | Wt 171.2 lb

## 2017-03-01 DIAGNOSIS — E785 Hyperlipidemia, unspecified: Secondary | ICD-10-CM | POA: Diagnosis not present

## 2017-03-01 DIAGNOSIS — E039 Hypothyroidism, unspecified: Secondary | ICD-10-CM | POA: Diagnosis not present

## 2017-03-01 DIAGNOSIS — I10 Essential (primary) hypertension: Secondary | ICD-10-CM

## 2017-03-01 DIAGNOSIS — I499 Cardiac arrhythmia, unspecified: Secondary | ICD-10-CM | POA: Diagnosis not present

## 2017-03-01 LAB — CBC WITH DIFFERENTIAL/PLATELET
BASOS ABS: 77 {cells}/uL (ref 0–200)
BASOS PCT: 1.2 %
EOS PCT: 2.2 %
Eosinophils Absolute: 141 cells/uL (ref 15–500)
HEMATOCRIT: 41.5 % (ref 35.0–45.0)
HEMOGLOBIN: 14.4 g/dL (ref 11.7–15.5)
LYMPHS ABS: 2259 {cells}/uL (ref 850–3900)
MCH: 29.1 pg (ref 27.0–33.0)
MCHC: 34.7 g/dL (ref 32.0–36.0)
MCV: 83.8 fL (ref 80.0–100.0)
MONOS PCT: 8 %
MPV: 12.9 fL — AB (ref 7.5–12.5)
NEUTROS ABS: 3411 {cells}/uL (ref 1500–7800)
Neutrophils Relative %: 53.3 %
Platelets: 206 10*3/uL (ref 140–400)
RBC: 4.95 10*6/uL (ref 3.80–5.10)
RDW: 13 % (ref 11.0–15.0)
Total Lymphocyte: 35.3 %
WBC mixed population: 512 cells/uL (ref 200–950)
WBC: 6.4 10*3/uL (ref 3.8–10.8)

## 2017-03-01 LAB — BASIC METABOLIC PANEL
BUN: 17 mg/dL (ref 6–23)
CALCIUM: 9.4 mg/dL (ref 8.4–10.5)
CO2: 30 mEq/L (ref 19–32)
Chloride: 104 mEq/L (ref 96–112)
Creatinine, Ser: 0.71 mg/dL (ref 0.40–1.20)
GFR: 86.86 mL/min (ref >60.00)
Glucose, Bld: 93 mg/dL (ref 70–99)
POTASSIUM: 4.2 meq/L (ref 3.5–5.1)
Sodium: 142 mEq/L (ref 135–145)

## 2017-03-01 LAB — LIPID PANEL
Cholesterol: 191 mg/dL (ref 0–200)
HDL: 58.2 mg/dL (ref >39.00)
LDL Cholesterol: 113 mg/dL — ABNORMAL HIGH (ref 0–99)
NonHDL: 132.79
Total CHOL/HDL Ratio: 3
Triglycerides: 97 mg/dL (ref 0.0–149.0)
VLDL: 19.4 mg/dL (ref 0.0–40.0)

## 2017-03-01 LAB — TSH: TSH: 0.97 u[IU]/mL (ref 0.35–4.50)

## 2017-03-01 NOTE — Patient Instructions (Addendum)
BEFORE YOU LEAVE: [name], please obtain the mammogram report and abstract/update in epic -Labs -follow up: 3-4 months  Stop the trazodone.  We have ordered labs or studies at this visit. It can take up to 1-2 weeks for results and processing. IF results require follow up or explanation, we will call you with instructions. Clinically stable results will be released to your [facility]. If you have not heard from [place] or cannot find your results in [facility] in 2 weeks please contact our office at [phone].  If you are not yet signed up for [facility], please consider signing up.

## 2017-03-01 NOTE — Addendum Note (Signed)
Addended by: Elmer Picker on: 03/01/2017 09:14 AM   Modules accepted: Orders

## 2017-03-10 ENCOUNTER — Ambulatory Visit (HOSPITAL_COMMUNITY)
Admission: RE | Admit: 2017-03-10 | Discharge: 2017-03-10 | Disposition: A | Discharge status: Home / Self Care | Payer: Medicare Other | Source: Ambulatory Visit | Attending: Internal Medicine | Admitting: Internal Medicine

## 2017-03-10 DIAGNOSIS — I472 Ventricular tachycardia, unspecified: Secondary | ICD-10-CM

## 2017-03-10 DIAGNOSIS — I499 Cardiac arrhythmia, unspecified: Secondary | ICD-10-CM | POA: Diagnosis not present

## 2017-03-10 MED ORDER — GADOBENATE DIMEGLUMINE 529 MG/ML IV SOLN
30.0000 mL | Freq: Once | INTRAVENOUS | Status: AC
  Administered 2017-03-10: 30 mL via INTRAVENOUS

## 2017-03-23 ENCOUNTER — Telehealth: Payer: Self-pay

## 2017-03-23 DIAGNOSIS — R55 Syncope and collapse: Secondary | ICD-10-CM

## 2017-03-23 NOTE — Telephone Encounter (Signed)
Spoke with pt regarding Dr Olin Pia recommendation of having a single average EKG performed. Dr Caryl Comes would also like to discuss medications to help with symptom management after the study has been completed. Pt is aware of recommendations and will be awaiting scheduling dept's call. She did ask if an EP study would be appropriate. I stated Dr Caryl Comes would probably want to do this less invasive study first but she could discuss that with him if she would like to.

## 2017-03-23 NOTE — Telephone Encounter (Signed)
-----   Message from Deboraha Sprang, MD sent at 03/15/2017  9:41 PM EST ----- Please Inform Patient that study was normal greatnews  Thanks

## 2017-03-29 ENCOUNTER — Telehealth: Payer: Self-pay | Admitting: Internal Medicine

## 2017-03-29 NOTE — Telephone Encounter (Signed)
Set up Signal Averaged EKG at [facility] 3/14 @ 9:00 am.. Instructions and directions given..  Patient verbalized understanding.Erica Marquez

## 2017-03-29 NOTE — Telephone Encounter (Signed)
New message     Patient calling about a exercise test she was suppose to have done there is no order for a exercise test   STAT if HR is under 50 or over 120 (normal HR is 60-100 beats per minute)  1) What is your heart rate? 45-176  That is the range for today from 830a-now  76 right now   2) Do you have a log of your heart rate readings (document readings)?  Doesn't  have right now   Do you have any other symptoms? No energy , feels like she is anxious and lightheaded ,  Feels like chest is pounding  ( this started last week)

## 2017-04-01 ENCOUNTER — Ambulatory Visit (HOSPITAL_COMMUNITY)
Admission: RE | Admit: 2017-04-01 | Discharge: 2017-04-01 | Disposition: A | Discharge status: Home / Self Care | Payer: Medicare Other | Source: Ambulatory Visit | Attending: Internal Medicine | Admitting: Internal Medicine

## 2017-04-01 DIAGNOSIS — R55 Syncope and collapse: Secondary | ICD-10-CM

## 2017-04-09 ENCOUNTER — Telehealth: Payer: Self-pay | Admitting: Internal Medicine

## 2017-04-09 DIAGNOSIS — L738 Other specified follicular disorders: Secondary | ICD-10-CM | POA: Diagnosis not present

## 2017-04-09 DIAGNOSIS — D485 Neoplasm of uncertain behavior of skin: Secondary | ICD-10-CM | POA: Diagnosis not present

## 2017-04-09 DIAGNOSIS — L720 Epidermal cyst: Secondary | ICD-10-CM | POA: Diagnosis not present

## 2017-04-09 NOTE — Telephone Encounter (Signed)
New Message   Pt states she had a test done a week ago and she is calling for results and what she needs to do going forward. Please call

## 2017-04-15 ENCOUNTER — Telehealth: Payer: Self-pay

## 2017-04-15 NOTE — Telephone Encounter (Signed)
Talked with Erica Marquez today regarding her normal SA EKG. She would like to know what next steps will be and/or when she will be able to drive. She has not driven since November and will be out of the state for the entire summer starting the 3rd week in May. She would like to have some resolution before she leaves for the summer.

## 2017-04-15 NOTE — Telephone Encounter (Signed)
-----   Message from Deboraha Sprang, MD sent at 04/09/2017  5:04 PM EDT ----- Please Inform Patient that saECG are normal  Thanks

## 2017-04-27 DIAGNOSIS — Z6825 Body mass index (BMI) 25.0-25.9, adult: Secondary | ICD-10-CM | POA: Diagnosis not present

## 2017-04-27 DIAGNOSIS — Z124 Encounter for screening for malignant neoplasm of cervix: Secondary | ICD-10-CM | POA: Diagnosis not present

## 2017-04-29 ENCOUNTER — Encounter: Payer: Self-pay | Admitting: Family Medicine

## 2017-04-29 DIAGNOSIS — N644 Mastodynia: Secondary | ICD-10-CM | POA: Diagnosis not present

## 2017-04-29 LAB — HM MAMMOGRAPHY

## 2017-05-16 ENCOUNTER — Other Ambulatory Visit: Payer: Self-pay | Admitting: Family Medicine

## 2017-05-26 NOTE — Progress Notes (Signed)
HPI:  Using dictation device. Unfortunately this device frequently misinterprets words/phrases.  Erica Marquez is a pleasant 67 y.o. here for follow up. Chronic medical problems summarized below were reviewed for changes. Advised wean off of the trazodone last visit.  Reports has been doing well.  She has been drinking less caffeine and her heart symptoms have subsided.  Blood pressure is been running on the low end.  She sometimes feels a little lightheaded when she stands up suddenly.  She sees Dr. Caryl Marquez for management.  Reports she gets regular exercise and is eating a healthy diet.  Her mood has been good for a long time.  She needs refills on her antidepressant.  She reports she is done fine off of the trazodone.  Denies CP, SOB, DOE, treatment intolerance or new symptoms.  she did her mammogram.  PVCs/PACs/Ventricular tachycardia/Bradycardia/Syncope 2019/HTN: -s/p evaluation at [facility] and now seeing Dr. Caryl Marquez for managment Hypertension: -Medications: losartan -did not tolerate diuretic in the past   Hyperlipidemia: -Medications: pravastatin  Hypothyroidism: -Medications: levothyroxine  Asthma and allergies: -Medications: albuterol as needed, Symbicort, Zyrtec, Xyzal, Singulair  Acid reflux: -Medications: ranitidine  Depression: -Medications: Pristiq  IBS: -sees GI   ROS: See pertinent positives and negatives per HPI.  Past Medical History:  Diagnosis Date  . Allergy   . Arthritis   . Asthma   . BACK PAIN, LUMBAR, WITH RADICULOPATHY 09/12/2007   Qualifier: Diagnosis of  By: Erica Morale MD, Erica Marquez   . Blunt head trauma   . Chest pain    s/p extensive nag cardiac eval with cath, stress test and sees Dr. Jens Marquez  . Depression   . Diverticulosis of colon   . Edema 08/12/2009   Qualifier: Diagnosis of  By: Erica Morale MD, Balinda Quails   . Fibroids   . GERD (gastroesophageal reflux disease)    hiatal hernia  . Hyperlipidemia   . Hypertension   . Hypothyroidism  03/26/2014  . IBS (irritable bowel syndrome)   . Pelvic pain in female, chronic 10/24/2013  . Premature ventricular contractions   . Schatzki's ring    on EGD 2016  . Tubular adenoma of colon 03/2014    Past Surgical History:  Procedure Laterality Date  . BREAST SURGERY     fibroid tumors  . DILATION AND CURETTAGE OF UTERUS    . KNEE CARTILAGE SURGERY Right 03/2015   Torn miniscus    Family History  Problem Relation Age of Onset  . Coronary artery disease Mother   . Heart disease Mother   . Colon polyps Mother   . Diabetes Mother   . Stomach cancer Father   . Colon cancer Neg Hx   . Kidney disease Neg Hx   . Esophageal cancer Neg Hx   . Gallbladder disease Neg Hx     SOCIAL HX: see hpi   Current Outpatient Medications:  .  albuterol (PROVENTIL HFA;VENTOLIN HFA) 108 (90 Base) MCG/ACT inhaler, Inhale 2 puffs into the lungs every 6 (six) hours as needed. , Disp: , Rfl:  .  ammonium lactate (LAC-HYDRIN) 12 % lotion, Apply topically daily., Disp: , Rfl:  .  budesonide-formoterol (SYMBICORT) 160-4.5 MCG/ACT inhaler, Inhale 2 puffs into the lungs as needed. , Disp: , Rfl:  .  desvenlafaxine (PRISTIQ) 50 MG 24 hr tablet, Take 1 tablet (50 mg total) by mouth daily., Disp: 90 tablet, Rfl: 1 .  magnesium oxide (MAG-OX) 400 MG tablet, Take 1 tablet (400 mg total) by mouth daily., Disp: 90 tablet, Rfl:  3 .  montelukast (SINGULAIR) 10 MG tablet, Take 10 mg by mouth at bedtime., Disp: , Rfl:  .  pravastatin (PRAVACHOL) 40 MG tablet, TAKE ONE TABLET BY MOUTH ONE TIME DAILY , Disp: 90 tablet, Rfl: 1 .  Probiotic Product (ALIGN) 4 MG CAPS, Take by mouth., Disp: , Rfl:  .  SYNTHROID 137 MCG tablet, TAKE ONE TABLET BY MOUTH ONE TIME DAILY  before breakfast, Disp: 90 tablet, Rfl: 1 .  losartan (COZAAR) 50 MG tablet, Take 0.5 tablets (25 mg total) by mouth daily., Disp: 45 tablet, Rfl: 3  EXAM:  Vitals:   05/27/17 0903  BP: 100/60  Pulse: 60  Temp: 98.2 F (36.8 C)    Body mass index is  24.94 kg/m.  GENERAL: vitals reviewed and listed above, alert, oriented, appears well hydrated and in no acute distress  HEENT: atraumatic, conjunttiva clear, no obvious abnormalities on inspection of external nose and ears  NECK: no obvious masses on inspection  LUNGS: clear to auscultation bilaterally, no wheezes, rales or rhonchi, good air movement  CV: HRRR, no peripheral edema  MS: moves all extremities without noticeable abnormality  PSYCH: pleasant and cooperative, no obvious depression or anxiety  ASSESSMENT AND PLAN:  Discussed the following assessment and plan:  Essential hypertension - Plan: Basic metabolic panel, CBC  Premature ventricular contractions  Depression, recurrent (HCC)  Hypothyroidism, unspecified type - Plan: TSH  -See PHQ 9, given long-term mood stabilization, advised decreasing her Pristiq which is at a high dose given by her prior PCP -She actually would like to wean off the Pristiq altogether if possible, we will start this wean with recommendations to go to 75 mg daily for 1 to 2 weeks, followed by reducing to 50 mg dose -We also will reduce her blood pressure medication as her blood pressure is low today, she reports it has been low running low and she has some mild orthostatic symptoms at times -decrease to 25 mg daily of the losartan -Labs today -Follow-up in 4 to 6 weeks -Patient advised to return or notify a doctor immediately if symptoms worsen or persist or new concerns arise.  Patient Instructions  BEFORE YOU LEAVE: -Labs -Obtain and abstract mammogram to update health maintenance -follow up: Follow-up 4 to 6 weeks  Decrease the losartan to 25 mg daily.  Decrease the Pristiq to 75 mg daily for 1 to 2 weeks, followed by further reduction to 50 mg daily.  I sent a new prescription.  Follow-up sooner if any concerns.    [name], DO

## 2017-05-27 ENCOUNTER — Encounter: Payer: Self-pay | Admitting: Family Medicine

## 2017-05-27 ENCOUNTER — Ambulatory Visit (INDEPENDENT_AMBULATORY_CARE_PROVIDER_SITE_OTHER): Payer: Medicare Other | Admitting: Family Medicine

## 2017-05-27 VITALS — BP 100/60 | HR 60 | Temp 98.2°F | Ht 69.0 in | Wt 168.9 lb

## 2017-05-27 DIAGNOSIS — E039 Hypothyroidism, unspecified: Secondary | ICD-10-CM

## 2017-05-27 DIAGNOSIS — F339 Major depressive disorder, recurrent, unspecified: Secondary | ICD-10-CM

## 2017-05-27 DIAGNOSIS — I10 Essential (primary) hypertension: Secondary | ICD-10-CM

## 2017-05-27 DIAGNOSIS — I493 Ventricular premature depolarization: Secondary | ICD-10-CM | POA: Diagnosis not present

## 2017-05-27 MED ORDER — DESVENLAFAXINE SUCCINATE ER 50 MG PO TB24: 50.0000 mg | ORAL_TABLET | Freq: Every day | ORAL | 1 refills | Status: DC

## 2017-05-27 MED ORDER — LOSARTAN POTASSIUM 50 MG PO TABS: 25.0000 mg | ORAL_TABLET | Freq: Every day | ORAL | 3 refills | Status: DC

## 2017-05-27 NOTE — Patient Instructions (Signed)
BEFORE YOU LEAVE: -Labs -Obtain and abstract mammogram to update health maintenance -follow up: Follow-up 4 to 6 weeks  Decrease the losartan to 25 mg daily.  Decrease the Pristiq to 75 mg daily for 1 to 2 weeks, followed by further reduction to 50 mg daily.  I sent a new prescription.  Follow-up sooner if any concerns.

## 2017-06-03 ENCOUNTER — Encounter: Payer: Self-pay | Admitting: Family Medicine

## 2017-06-03 ENCOUNTER — Encounter: Payer: Self-pay | Admitting: Internal Medicine

## 2017-06-16 DIAGNOSIS — R55 Syncope and collapse: Secondary | ICD-10-CM | POA: Diagnosis not present

## 2017-06-16 DIAGNOSIS — R42 Dizziness and giddiness: Secondary | ICD-10-CM | POA: Diagnosis not present

## 2017-06-16 DIAGNOSIS — I10 Essential (primary) hypertension: Secondary | ICD-10-CM | POA: Diagnosis not present

## 2017-07-19 NOTE — Progress Notes (Signed)
HPI:  Using dictation device. Unfortunately this device frequently misinterprets words/phrases.  Erica Marquez is a pleasant 67 y.o. here for follow up. Chronic medical problems summarized below were reviewed for changes and stability and were updated as needed below. These issues and their treatment remain stable for the most part.  She was not able to get a hold of her current health cardiologist.  So she self-referred to a [facility]ologist at [facility],Dr. reports she saw him recently and he reviewed all of Dr. Georg Ruddle.  Cardiac testing and symptoms.  Reports he stopped her blood pressure medication entirely.  He has been checking her blood pressure at home and it has been fine.  Her pulse remains in the 40s to 50s.  She has occasional palpitations and this is when she feels a little sluggish.  She has a follow-up appointment tomorrow with the cardiologist and will discuss this. Denies CP, SOB, DOE, treatment intolerance or new symptoms. Due for labs and mammo  PVCs/PACs/Ventricular tachycardia/Bradycardia/Syncope 2019/HTN: -s/p evaluation at [facility] and was seeing Dr. Caryl Comes for management -then saw Dr. Georg Ruddle at [facility] in 2019  Hypertension: -Medications: losartan -stopped in 2019 by cardiologist at [facility], Dr. Georg Ruddle -did not tolerate diuretic in the past   Hyperlipidemia: -Medications: pravastatin  Hypothyroidism: -Medications: levothyroxine  Asthma and allergies: -Medications: albuterol as needed, Symbicort, Zyrtec, Xyzal, Singulair  Acid reflux: -Medications: ranitidine  Depression: -Medications: Pristiq  IBS: -sees GI    ROS: See pertinent positives and negatives per HPI.  Past Medical History:  Diagnosis Date  . Allergy   . Arthritis   . Asthma   . BACK PAIN, LUMBAR, WITH RADICULOPATHY 09/12/2007   Qualifier: Diagnosis of  By: Arnoldo Morale MD, John E   . Blunt head trauma   . Chest pain    s/p extensive nag cardiac eval with cath, stress test and sees Dr. Jens Som   . Depression   . Diverticulosis of colon   . Edema 08/12/2009   Qualifier: Diagnosis of  By: Arnoldo Morale MD, Balinda Quails   . Fibroids   . GERD (gastroesophageal reflux disease)    hiatal hernia  . Hyperlipidemia   . Hypertension   . Hypothyroidism 03/26/2014  . IBS (irritable bowel syndrome)   . Pelvic pain in female, chronic 10/24/2013  . Premature ventricular contractions   . Schatzki's ring    on EGD 2016  . Tubular adenoma of colon 03/2014    Past Surgical History:  Procedure Laterality Date  . BREAST SURGERY     fibroid tumors  . DILATION AND CURETTAGE OF UTERUS    . KNEE CARTILAGE SURGERY Right 03/2015   Torn miniscus    Family History  Problem Relation Age of Onset  . Coronary artery disease Mother   . Heart disease Mother   . Colon polyps Mother   . Diabetes Mother   . Stomach cancer Father   . Colon cancer Neg Hx   . Kidney disease Neg Hx   . Esophageal cancer Neg Hx   . Gallbladder disease Neg Hx     SOCIAL HX: See HPI to   Current Outpatient Medications:  .  albuterol (PROVENTIL HFA;VENTOLIN HFA) 108 (90 Base) MCG/ACT inhaler, Inhale 2 puffs into the lungs every 6 (six) hours as needed. , Disp: , Rfl:  .  ammonium lactate (LAC-HYDRIN) 12 % lotion, Apply topically daily., Disp: , Rfl:  .  budesonide-formoterol (SYMBICORT) 160-4.5 MCG/ACT inhaler, Inhale 2 puffs into the lungs as needed. , Disp: , Rfl:  .  desvenlafaxine (PRISTIQ) 50 MG 24 hr tablet, Take 1 tablet (50 mg total) by mouth daily., Disp: 90 tablet, Rfl: 1 .  magnesium oxide (MAG-OX) 400 MG tablet, Take 1 tablet (400 mg total) by mouth daily., Disp: 90 tablet, Rfl: 3 .  montelukast (SINGULAIR) 10 MG tablet, Take 10 mg by mouth at bedtime., Disp: , Rfl:  .  pravastatin (PRAVACHOL) 40 MG tablet, TAKE ONE TABLET BY MOUTH ONE TIME DAILY , Disp: 90 tablet, Rfl: 1 .  Probiotic Product (ALIGN) 4 MG CAPS, Take by mouth., Disp: , Rfl:  .  SYNTHROID 137 MCG tablet, TAKE ONE TABLET BY MOUTH ONE TIME DAILY  before  breakfast, Disp: 90 tablet, Rfl: 1  EXAM:  Vitals:   07/20/17 0938  BP: 120/78  Pulse: (!) 46  Temp: 98.3 F (36.8 C)    Body mass index is 25.09 kg/m.  GENERAL: vitals reviewed and listed above, alert, oriented, appears well hydrated and in no acute distress  HEENT: atraumatic, conjunttiva clear, no obvious abnormalities on inspection of external nose and ears  NECK: no obvious masses on inspection  LUNGS: clear to auscultation bilaterally, no wheezes, rales or rhonchi, good air movement  CV: brady, no peripheral edema  MS: moves all extremities without noticeable abnormality  PSYCH: pleasant and cooperative, no obvious depression or anxiety  ASSESSMENT AND PLAN:  Discussed the following assessment and plan:  Essential hypertension - Plan: Basic metabolic panel, CBC  Bradycardia  Hyperlipidemia, unspecified hyperlipidemia type  Hypothyroidism, unspecified type - Plan: TSH  -Blood pressure is low today off of medications -She sees her cardiologist at [facility] for follow-up tomorrow, advised that she discussed the low heart rate and her symptoms -Labs per orders today -Follow-up in 4 months -Advised assistant to update epic, she had her mammogram at [facility] and this does not automatically update in epic -Patient advised to follow-up sooner if new concerns arise.  Patient Instructions  BEFORE YOU LEAVE: -[name], update mammo in epic - please close the loop -labs -follow up: 3-4 months  See the cardiologist tomorrow as planned.  We have ordered labs or studies at this visit. It can take up to 1-2 weeks for results and processing. IF results require follow up or explanation, we will call you with instructions. Clinically stable results will be released to your [facility]. If you have not heard from [place] or cannot find your results in [facility] in 2 weeks please contact our office at [phone].  If you are not yet signed up for [facility], please consider signing  up.           [name], DO

## 2017-07-20 ENCOUNTER — Ambulatory Visit (INDEPENDENT_AMBULATORY_CARE_PROVIDER_SITE_OTHER): Payer: Medicare Other | Admitting: Family Medicine

## 2017-07-20 ENCOUNTER — Encounter: Payer: Self-pay | Admitting: Family Medicine

## 2017-07-20 VITALS — BP 118/68 | HR 46 | Temp 98.3°F | Ht 69.0 in | Wt 169.9 lb

## 2017-07-20 DIAGNOSIS — E039 Hypothyroidism, unspecified: Secondary | ICD-10-CM | POA: Diagnosis not present

## 2017-07-20 DIAGNOSIS — R001 Bradycardia, unspecified: Secondary | ICD-10-CM | POA: Diagnosis not present

## 2017-07-20 DIAGNOSIS — I10 Essential (primary) hypertension: Secondary | ICD-10-CM

## 2017-07-20 DIAGNOSIS — E785 Hyperlipidemia, unspecified: Secondary | ICD-10-CM

## 2017-07-20 LAB — BASIC METABOLIC PANEL
BUN: 13 mg/dL (ref 6–23)
CALCIUM: 9.3 mg/dL (ref 8.4–10.5)
CO2: 29 meq/L (ref 19–32)
Chloride: 104 mEq/L (ref 96–112)
Creatinine, Ser: 0.75 mg/dL (ref 0.40–1.20)
GFR: 81.44 mL/min (ref >60.00)
GLUCOSE: 97 mg/dL (ref 70–99)
POTASSIUM: 4.7 meq/L (ref 3.5–5.1)
SODIUM: 141 meq/L (ref 135–145)

## 2017-07-20 LAB — CBC
HEMATOCRIT: 43.9 % (ref 36.0–46.0)
Hemoglobin: 14.6 g/dL (ref 12.0–15.0)
MCHC: 33.1 g/dL (ref 30.0–36.0)
MCV: 87 fl (ref 78.0–100.0)
PLATELETS: 205 10*3/uL (ref 150.0–400.0)
RBC: 5.05 Mil/uL (ref 3.87–5.11)
RDW: 14.5 % (ref 11.5–15.5)
WBC: 5.9 10*3/uL (ref 4.0–10.5)

## 2017-07-20 LAB — TSH: TSH: 1.43 u[IU]/mL (ref 0.35–4.50)

## 2017-07-20 NOTE — Patient Instructions (Signed)
BEFORE YOU LEAVE: -[name], update mammo in epic - please close the loop -labs -follow up: 3-4 months  See the cardiologist tomorrow as planned.  We have ordered labs or studies at this visit. It can take up to 1-2 weeks for results and processing. IF results require follow up or explanation, we will call you with instructions. Clinically stable results will be released to your [facility]. If you have not heard from [place] or cannot find your results in [facility] in 2 weeks please contact our office at [phone].  If you are not yet signed up for [facility], please consider signing up.

## 2017-07-21 DIAGNOSIS — I10 Essential (primary) hypertension: Secondary | ICD-10-CM | POA: Diagnosis not present

## 2017-07-21 DIAGNOSIS — R55 Syncope and collapse: Secondary | ICD-10-CM | POA: Diagnosis not present

## 2017-07-21 DIAGNOSIS — R001 Bradycardia, unspecified: Secondary | ICD-10-CM | POA: Diagnosis not present

## 2017-07-27 ENCOUNTER — Encounter: Payer: Self-pay | Admitting: Family Medicine

## 2017-07-30 DIAGNOSIS — I491 Atrial premature depolarization: Secondary | ICD-10-CM | POA: Diagnosis not present

## 2017-07-30 DIAGNOSIS — I493 Ventricular premature depolarization: Secondary | ICD-10-CM | POA: Diagnosis not present

## 2017-08-02 DIAGNOSIS — L538 Other specified erythematous conditions: Secondary | ICD-10-CM | POA: Diagnosis not present

## 2017-08-02 DIAGNOSIS — L578 Other skin changes due to chronic exposure to nonionizing radiation: Secondary | ICD-10-CM | POA: Diagnosis not present

## 2017-08-02 DIAGNOSIS — L853 Xerosis cutis: Secondary | ICD-10-CM | POA: Diagnosis not present

## 2017-08-02 DIAGNOSIS — X32XXXA Exposure to sunlight, initial encounter: Secondary | ICD-10-CM | POA: Diagnosis not present

## 2017-08-02 DIAGNOSIS — L298 Other pruritus: Secondary | ICD-10-CM | POA: Diagnosis not present

## 2017-08-02 DIAGNOSIS — D23112 Other benign neoplasm of skin of right lower eyelid, including canthus: Secondary | ICD-10-CM | POA: Diagnosis not present

## 2017-08-02 DIAGNOSIS — L918 Other hypertrophic disorders of the skin: Secondary | ICD-10-CM | POA: Diagnosis not present

## 2017-08-02 DIAGNOSIS — D485 Neoplasm of uncertain behavior of skin: Secondary | ICD-10-CM | POA: Diagnosis not present

## 2017-08-02 DIAGNOSIS — L820 Inflamed seborrheic keratosis: Secondary | ICD-10-CM | POA: Diagnosis not present

## 2017-08-02 DIAGNOSIS — L814 Other melanin hyperpigmentation: Secondary | ICD-10-CM | POA: Diagnosis not present

## 2017-08-02 DIAGNOSIS — B001 Herpesviral vesicular dermatitis: Secondary | ICD-10-CM | POA: Diagnosis not present

## 2017-08-02 DIAGNOSIS — L821 Other seborrheic keratosis: Secondary | ICD-10-CM | POA: Diagnosis not present

## 2017-08-03 ENCOUNTER — Ambulatory Visit: Payer: Medicare Other

## 2017-08-06 ENCOUNTER — Encounter: Payer: Self-pay | Admitting: *Deleted

## 2017-08-20 ENCOUNTER — Encounter: Payer: Self-pay | Admitting: Family Medicine

## 2017-08-20 DIAGNOSIS — Z1231 Encounter for screening mammogram for malignant neoplasm of breast: Secondary | ICD-10-CM | POA: Diagnosis not present

## 2017-08-20 LAB — HM MAMMOGRAPHY

## 2017-08-31 DIAGNOSIS — Z87891 Personal history of nicotine dependence: Secondary | ICD-10-CM | POA: Diagnosis not present

## 2017-08-31 DIAGNOSIS — S00261A Insect bite (nonvenomous) of right eyelid and periocular area, initial encounter: Secondary | ICD-10-CM | POA: Diagnosis not present

## 2017-08-31 DIAGNOSIS — Z7951 Long term (current) use of inhaled steroids: Secondary | ICD-10-CM | POA: Diagnosis not present

## 2017-08-31 DIAGNOSIS — G44319 Acute post-traumatic headache, not intractable: Secondary | ICD-10-CM | POA: Diagnosis not present

## 2017-08-31 DIAGNOSIS — R51 Headache: Secondary | ICD-10-CM | POA: Diagnosis not present

## 2017-08-31 DIAGNOSIS — S01151A Open bite of right eyelid and periocular area, initial encounter: Secondary | ICD-10-CM | POA: Diagnosis not present

## 2017-08-31 DIAGNOSIS — I10 Essential (primary) hypertension: Secondary | ICD-10-CM | POA: Diagnosis not present

## 2017-08-31 DIAGNOSIS — R251 Tremor, unspecified: Secondary | ICD-10-CM | POA: Diagnosis not present

## 2017-08-31 DIAGNOSIS — Z79899 Other long term (current) drug therapy: Secondary | ICD-10-CM | POA: Diagnosis not present

## 2017-08-31 DIAGNOSIS — W57XXXA Bitten or stung by nonvenomous insect and other nonvenomous arthropods, initial encounter: Secondary | ICD-10-CM | POA: Diagnosis not present

## 2017-08-31 DIAGNOSIS — S0086XA Insect bite (nonvenomous) of other part of head, initial encounter: Secondary | ICD-10-CM | POA: Diagnosis not present

## 2017-08-31 DIAGNOSIS — L03211 Cellulitis of face: Secondary | ICD-10-CM | POA: Diagnosis not present

## 2017-08-31 DIAGNOSIS — M436 Torticollis: Secondary | ICD-10-CM | POA: Diagnosis not present

## 2017-09-02 ENCOUNTER — Encounter: Payer: Self-pay | Admitting: Family Medicine

## 2017-09-06 ENCOUNTER — Encounter: Payer: Self-pay | Admitting: Family Medicine

## 2017-09-06 ENCOUNTER — Ambulatory Visit (INDEPENDENT_AMBULATORY_CARE_PROVIDER_SITE_OTHER): Payer: Medicare Other | Admitting: Family Medicine

## 2017-09-06 VITALS — BP 122/78 | HR 86 | Temp 98.7°F | Ht 69.0 in | Wt 172.2 lb

## 2017-09-06 DIAGNOSIS — L03811 Cellulitis of head [any part, except face]: Secondary | ICD-10-CM | POA: Diagnosis not present

## 2017-09-06 DIAGNOSIS — R51 Headache: Secondary | ICD-10-CM

## 2017-09-06 DIAGNOSIS — R21 Rash and other nonspecific skin eruption: Secondary | ICD-10-CM

## 2017-09-06 DIAGNOSIS — R519 Headache, unspecified: Secondary | ICD-10-CM

## 2017-09-06 MED ORDER — TIZANIDINE HCL 2 MG PO TABS: 2.0000 mg | ORAL_TABLET | Freq: Every evening | ORAL | 0 refills | Status: DC | PRN

## 2017-09-06 NOTE — Patient Instructions (Signed)
BEFORE YOU LEAVE: -[name], please obtain printed records from outside hospital -follow up: 1-2 weeks  Finish the antibiotic today  Taper off the hydrocodone - 1 tablet daily for a few more days, then 1/2 tablet for a few days  Use the tizanidine if needed at night before bed   Tylenol or aleve if needed per instructions for pain  I hope you are feeling better soon! Seek care promptly if your symptoms worsen, new concerns arise or you are not improving with treatment.

## 2017-09-06 NOTE — Progress Notes (Signed)
HPI:  Using dictation device. Unfortunately this device frequently misinterprets words/phrases.  Acute visit for skin rash and head pain: -started insidiously about 2 weeks ago -[place] the [place] and developed head pain R sided, then a lesion on the R cheek -reports head pain was odd as felt tingly on the R side of he head and scalp some headache and som R sided neck pain -she was see in [facility] then ER and reports extensive eval with labs and CT scan and was dx with cellulitis from a bug bite - she was given norco, prednisone, bactrim, muscle relaxer zofran -she felt like the muscle relaxer helped -she has been taking all the meds, but feels they kinda make her feel out of it and wants to stop -denies fevers, worsening (head pain is actually improving), neck stiffness, vision issues, vomiting, nausea -she noticed a few other lesions in the R scalp developed, none recently and seem to be improving now  ROS: See pertinent positives and negatives per HPI.  Past Medical History:  Diagnosis Date  . Allergy   . Arthritis   . Asthma   . BACK PAIN, LUMBAR, WITH RADICULOPATHY 09/12/2007   Qualifier: Diagnosis of  By: Arnoldo Morale MD, John E   . Blunt head trauma   . Chest pain    s/p extensive nag cardiac eval with cath, stress test and sees Dr. Jens Som  . Depression   . Diverticulosis of colon   . Edema 08/12/2009   Qualifier: Diagnosis of  By: Arnoldo Morale MD, Balinda Quails   . Fibroids   . GERD (gastroesophageal reflux disease)    hiatal hernia  . Hyperlipidemia   . Hypertension   . Hypothyroidism 03/26/2014  . IBS (irritable bowel syndrome)   . Pelvic pain in female, chronic 10/24/2013  . Premature ventricular contractions   . Schatzki's ring    on EGD 2016  . Tubular adenoma of colon 03/2014    Past Surgical History:  Procedure Laterality Date  . BREAST SURGERY     fibroid tumors  . DILATION AND CURETTAGE OF UTERUS    . KNEE CARTILAGE SURGERY Right 03/2015   Torn miniscus    Family History   Problem Relation Age of Onset  . Coronary artery disease Mother   . Heart disease Mother   . Colon polyps Mother   . Diabetes Mother   . Stomach cancer Father   . Colon cancer Neg Hx   . Kidney disease Neg Hx   . Esophageal cancer Neg Hx   . Gallbladder disease Neg Hx     SOCIAL HX: see hpi   Current Outpatient Medications:  .  albuterol (PROVENTIL HFA;VENTOLIN HFA) 108 (90 Base) MCG/ACT inhaler, Inhale 2 puffs into the lungs every 6 (six) hours as needed. , Disp: , Rfl:  .  ammonium lactate (LAC-HYDRIN) 12 % lotion, Apply topically daily., Disp: , Rfl:  .  budesonide-formoterol (SYMBICORT) 160-4.5 MCG/ACT inhaler, Inhale 2 puffs into the lungs as needed. , Disp: , Rfl:  .  desvenlafaxine (PRISTIQ) 50 MG 24 hr tablet, Take 1 tablet (50 mg total) by mouth daily., Disp: 90 tablet, Rfl: 1 .  HYDROcodone-acetaminophen (NORCO/VICODIN) 5-325 MG tablet, Take 1-2 tablets by mouth as needed for moderate pain., Disp: , Rfl:  .  levocetirizine (XYZAL) 5 MG tablet, Take 5 mg by mouth every evening., Disp: , Rfl:  .  magnesium oxide (MAG-OX) 400 MG tablet, Take 1 tablet (400 mg total) by mouth daily., Disp: 90 tablet, Rfl: 3 .  montelukast (SINGULAIR) 10 MG tablet, Take 10 mg by mouth at bedtime., Disp: , Rfl:  .  ondansetron (ZOFRAN-ODT) 4 MG disintegrating tablet, Take 4 mg by mouth as needed for nausea or vomiting., Disp: , Rfl:  .  orphenadrine (NORFLEX) 100 MG tablet, 100 mg. Take 1-2 every 6 hours as needed, Disp: , Rfl:  .  pravastatin (PRAVACHOL) 40 MG tablet, TAKE ONE TABLET BY MOUTH ONE TIME DAILY , Disp: 90 tablet, Rfl: 1 .  Probiotic Product (ALIGN) 4 MG CAPS, Take by mouth., Disp: , Rfl:  .  SULFAMETHOXAZOLE-TRIMETHOPRIM PO, Take by mouth 2 (two) times daily., Disp: , Rfl:  .  SYNTHROID 137 MCG tablet, TAKE ONE TABLET BY MOUTH ONE TIME DAILY  before breakfast, Disp: 90 tablet, Rfl: 1 .  traZODone (DESYREL) 50 MG tablet, Take 50 mg by mouth at bedtime as needed for sleep., Disp: , Rfl:   .  tiZANidine (ZANAFLEX) 2 MG tablet, Take 1 tablet (2 mg total) by mouth at bedtime as needed for muscle spasms., Disp: 14 tablet, Rfl: 0  EXAM:  Vitals:   09/06/17 0831  BP: 122/78  Pulse: 86  Temp: 98.7 F (37.1 C)    Body mass index is 25.43 kg/m.  GENERAL: vitals reviewed and listed above, alert, oriented, appears well hydrated and in no acute distress  HEENT: atraumatic, PERRLA, normal facial symmetry, conjunttiva clear, no obvious abnormalities on inspection of external nose and ears  NECK: no obvious masses on inspection, no meningeal signs  LUNGS: clear to auscultation bilaterally, no wheezes, rales or rhonchi, good air movement  CV: HRRR, no peripheral edema  MS: moves all extremities without noticeable abnormality  SKIN: healing ? Mildly ulcerated lesion R cheek, 2 other small lesion R scalp - no signs bacterial infection (indration, pus, crusting, etc)  PSYCH: pleasant and cooperative, no obvious depression or anxiety  ASSESSMENT AND PLAN:  Discussed the following assessment and plan:  Rash  Cellulitis of head except face  Nonintractable headache, unspecified chronicity pattern, unspecified headache type  -we discussed possible serious and likely etiologies, workup and treatment, treatment risks and return precautions - query shingles? Vs other -at this point I don't think anteviral would help and she seems to be healing, but needs to get off the list of medications  -after this discussion, Ahsha opted for: she already stopped the prednisone a few days ago, will stop bactrim after today - > 5 days complete and no signs of bacterial infection at this point, taper of hydrocodone, she feels like muscle relaxer helps - will change to low dose tizanidine, OTC analgesics if needed -follow up advised in 1-2 weeks, sooner if needed -advised assistant to obtain records from OSH -of course, we advised Delanna  to return or notify a doctor immediately if symptoms  worsen or persist or new concerns arise.    Patient Instructions  BEFORE YOU LEAVE: -[name], please obtain printed records from outside hospital -follow up: 1-2 weeks  Finish the antibiotic today  Taper off the hydrocodone - 1 tablet daily for a few more days, then 1/2 tablet for a few days  Use the tizanidine if needed at night before bed   Tylenol or aleve if needed per instructions for pain  I hope you are feeling better soon! Seek care promptly if your symptoms worsen, new concerns arise or you are not improving with treatment.      [name], DO

## 2017-09-16 ENCOUNTER — Ambulatory Visit: Payer: Medicare Other | Admitting: Family Medicine

## 2017-09-23 ENCOUNTER — Encounter: Payer: Self-pay | Admitting: Family Medicine

## 2017-09-23 ENCOUNTER — Ambulatory Visit (INDEPENDENT_AMBULATORY_CARE_PROVIDER_SITE_OTHER): Payer: Medicare Other | Admitting: Family Medicine

## 2017-09-23 VITALS — BP 124/82 | HR 70 | Temp 98.0°F | Ht 69.0 in | Wt 174.8 lb

## 2017-09-23 DIAGNOSIS — R21 Rash and other nonspecific skin eruption: Secondary | ICD-10-CM | POA: Diagnosis not present

## 2017-09-23 DIAGNOSIS — H9201 Otalgia, right ear: Secondary | ICD-10-CM | POA: Diagnosis not present

## 2017-09-23 DIAGNOSIS — Z7185 Encounter for immunization safety counseling: Secondary | ICD-10-CM

## 2017-09-23 DIAGNOSIS — R51 Headache: Secondary | ICD-10-CM

## 2017-09-23 DIAGNOSIS — Z7189 Other specified counseling: Secondary | ICD-10-CM | POA: Diagnosis not present

## 2017-09-23 DIAGNOSIS — R519 Headache, unspecified: Secondary | ICD-10-CM

## 2017-09-23 NOTE — Patient Instructions (Addendum)
BEFORE YOU LEAVE: -follow up:  1) visit with Erica Marquez for flu and shingles vaccines in 1 month 2) follow up with Dr. Maudie Mercury in 3 months  Use aquaphor on skin lesion

## 2017-09-23 NOTE — Progress Notes (Signed)
HPI:  Using dictation device. Unfortunately this device frequently misinterprets words/phrases.  F/u skin lesions/headache/neck pain: -dx with cellulitis at OSH initially -at visit 8/19 we felt this may be shingles, opted against antiviral given duration of symptoms -today reports: "doing so much better" still mild pain in the scalp in area on R head/R ear - but has not needed any tylenol for 4-5 days -denies:fevers, malaise, worsening rash  ROS: See pertinent positives and negatives per HPI.  Past Medical History:  Diagnosis Date  . Allergy   . Arthritis   . Asthma   . BACK PAIN, LUMBAR, WITH RADICULOPATHY 09/12/2007   Qualifier: Diagnosis of  By: Erica Morale MD, John E   . Blunt head trauma   . Chest pain    s/p extensive nag cardiac eval with cath, stress test and sees Dr. Jens Marquez  . Depression   . Diverticulosis of colon   . Edema 08/12/2009   Qualifier: Diagnosis of  By: Erica Morale MD, Erica Marquez   . Fibroids   . GERD (gastroesophageal reflux disease)    hiatal hernia  . Hyperlipidemia   . Hypertension   . Hypothyroidism 03/26/2014  . IBS (irritable bowel syndrome)   . Pelvic pain in female, chronic 10/24/2013  . Premature ventricular contractions   . Schatzki's ring    on EGD 2016  . Tubular adenoma of colon 03/2014    Past Surgical History:  Procedure Laterality Date  . BREAST SURGERY     fibroid tumors  . DILATION AND CURETTAGE OF UTERUS    . KNEE CARTILAGE SURGERY Right 03/2015   Torn miniscus    Family History  Problem Relation Age of Onset  . Coronary artery disease Mother   . Heart disease Mother   . Colon polyps Mother   . Diabetes Mother   . Stomach cancer Father   . Colon cancer Neg Hx   . Kidney disease Neg Hx   . Esophageal cancer Neg Hx   . Gallbladder disease Neg Hx     SOCIAL HX: see hpi   Current Outpatient Medications:  .  albuterol (PROVENTIL HFA;VENTOLIN HFA) 108 (90 Base) MCG/ACT inhaler, Inhale 2 puffs into the lungs every 6 (six) hours as  needed. , Disp: , Rfl:  .  ammonium lactate (LAC-HYDRIN) 12 % lotion, Apply topically daily., Disp: , Rfl:  .  budesonide-formoterol (SYMBICORT) 160-4.5 MCG/ACT inhaler, Inhale 2 puffs into the lungs as needed. , Disp: , Rfl:  .  desvenlafaxine (PRISTIQ) 50 MG 24 hr tablet, Take 1 tablet (50 mg total) by mouth daily., Disp: 90 tablet, Rfl: 1 .  levocetirizine (XYZAL) 5 MG tablet, Take 5 mg by mouth every evening., Disp: , Rfl:  .  magnesium oxide (MAG-OX) 400 MG tablet, Take 1 tablet (400 mg total) by mouth daily., Disp: 90 tablet, Rfl: 3 .  montelukast (SINGULAIR) 10 MG tablet, Take 10 mg by mouth at bedtime., Disp: , Rfl:  .  pravastatin (PRAVACHOL) 40 MG tablet, TAKE ONE TABLET BY MOUTH ONE TIME DAILY , Disp: 90 tablet, Rfl: 1 .  Probiotic Product (ALIGN) 4 MG CAPS, Take by mouth., Disp: , Rfl:  .  SYNTHROID 137 MCG tablet, TAKE ONE TABLET BY MOUTH ONE TIME DAILY  before breakfast, Disp: 90 tablet, Rfl: 1 .  traZODone (DESYREL) 50 MG tablet, Take 50 mg by mouth at bedtime as needed for sleep., Disp: , Rfl:   EXAM:  Vitals:   09/23/17 1344  BP: 124/82  Pulse: 70  Temp: 98 F (  36.7 C)  SpO2: 98%    Body mass index is 25.81 kg/m.  GENERAL: vitals reviewed and listed above, alert, oriented, appears well hydrated and in no acute distress  HEENT: atraumatic, conjunttiva clear, no obvious abnormalities on inspection of external nose and ears, normal appearance ear canals and TMs  NECK: no obvious masses on inspection  SKIN: small healing scab R cheek  MS: moves all extremities without noticeable abnormality  PSYCH: pleasant and cooperative, no obvious depression or anxiety  ASSESSMENT AND PLAN:  Discussed the following assessment and plan:  Rash  Acute nonintractable headache, unspecified headache type  Right ear pain  Vaccine counseling  -we both think this was probably shingles -seems to be doing much much better -had been covering lesions on face and applying  antibiotic - advised uncovering if possible and sm amount of aquaphor -she had some questions about vaccines, discussed and she plans to schedule vaccine visit in October for flu shot and shingles vaccine -Patient advised to follow up sooner if needed  Patient Instructions  BEFORE YOU LEAVE: -follow up:  1) visit with Erica Marquez for flu and shingles vaccines in 1 month 2) follow up with Dr. Maudie Mercury in 3 months  Use aquaphor on skin lesion   Erica Kern, DO

## 2017-10-04 NOTE — Progress Notes (Signed)
Subjective:   Erica Marquez is a 67 y.o. female who presents for Medicare Annual (Subsequent) preventive examination.  Reports health as challenged recently About one year ago she fainted in the middle of the night Noted changes in vision and then hit the floor and woke up   Fup with EP cardiologist - seeing Dr. Loma Sender cardia x 2 to 3 in the last year and the last episode started Saturday.She did not call cardiology but instructed to do so today  BP dropped and HR dropped; gets light headed w mild nausea    [place] the [place] at the beach. Had shingles and not dx until her routine to Dr. Joneen Caraway still itches but no pain / holding shingrix shot but will call   Exercise induced allergies   Lives with spouse 4 grands (one freshman 46, 9, 59 )   Apt with neurology  10/8   Is waiting one month prior to taking the shingrix  Diet  BMI 26  Chol/hdl 3  Breakfast boiled egg; coffee Lunch; some kind of chicken, fruit Dinner salad   Exercise Paddle boards started x 67 yo [facility]     There are no preventive care reminders to display for this patient.  colonoscopy 03/2014; Tubular adenoma - due again 03/2019 Saw Dr. Maudie Mercury stools are lose and long skinny- formed  Started x 3 weeks ago; off prednisone by then but was on a lot of meds for shingles which interfered with her diet  Pap 03/05/2015  Mammogram 04/2017   Was told to wait until 10/10 OV with Dr. Maudie Mercury to take her flu vaccine    Cardiac Risk Factors include: advanced age (>2men, >13 women);family history of premature cardiovascular disease     Objective:     Vitals: BP 140/80   Pulse (!) 58   Ht 5\' 9"  (1.753 m)   Wt 176 lb (79.8 kg)   SpO2 98%   BMI 25.99 kg/m   Body mass index is 25.99 kg/m.  Advanced Directives 10/05/2017 07/31/2016 01/25/2014  Does Patient Have a Medical Advance Directive? Yes Yes Yes  Type of Advance Directive - [place];Living will Living  will;Healthcare Power of Attorney  Does patient want to make changes to medical advance directive? - No - Patient declined No - Patient declined  Copy of [place] in Chart? - No - copy requested No - copy requested    Tobacco Social History   Tobacco Use  Smoking Status Former Smoker  . Packs/day: 0.50  . Years: 20.00  . Pack years: 10.00  . Types: Cigarettes  . Last attempt to quit: 01/20/1988  . Years since quitting: 29.7  Smokeless Tobacco Never Used  Tobacco Comment   quit remotely     Counseling given: Yes Comment: quit remotely former smoker but quit 1990 with 10 pack years  Clinical Intake:     Past Medical History:  Diagnosis Date  . Allergy   . Arthritis   . Asthma   . BACK PAIN, LUMBAR, WITH RADICULOPATHY 09/12/2007   Qualifier: Diagnosis of  By: Arnoldo Morale MD, John E   . Blunt head trauma   . Chest pain    s/p extensive nag cardiac eval with cath, stress test and sees Dr. Jens Som  . Depression   . Diverticulosis of colon   . Edema 08/12/2009   Qualifier: Diagnosis of  By: Arnoldo Morale MD, Balinda Quails   . Fibroids   .  GERD (gastroesophageal reflux disease)    hiatal hernia  . Hyperlipidemia   . Hypertension   . Hypothyroidism 03/26/2014  . IBS (irritable bowel syndrome)   . Pelvic pain in female, chronic 10/24/2013  . Premature ventricular contractions   . Schatzki's ring    on EGD 2016  . Tubular adenoma of colon 03/2014   Past Surgical History:  Procedure Laterality Date  . BREAST SURGERY     fibroid tumors  . DILATION AND CURETTAGE OF UTERUS    . KNEE CARTILAGE SURGERY Right 03/2015   Torn miniscus   Family History  Problem Relation Age of Onset  . Coronary artery disease Mother   . Heart disease Mother   . Colon polyps Mother   . Diabetes Mother   . Stomach cancer Father   . Colon cancer Neg Hx   . Kidney disease Neg Hx   . Esophageal cancer Neg Hx   . Gallbladder disease Neg Hx    Social History   Socioeconomic History  .  Marital status: Married    Spouse name: Not on file  . Number of children: 2  . Years of education: Not on file  . Highest education level: Not on file  Occupational History  . Occupation: Housewife  Social Needs  . Financial resource strain: Not on file  . Food insecurity:    Worry: Not on file    Inability: Not on file  . Transportation needs:    Medical: Not on file    Non-medical: Not on file  Tobacco Use  . Smoking status: Former Smoker    Packs/day: 0.50    Years: 20.00    Pack years: 10.00    Types: Cigarettes    Last attempt to quit: 01/20/1988    Years since quitting: 29.7  . Smokeless tobacco: Never Used  . Tobacco comment: quit remotely  Substance and Sexual Activity  . Alcohol use: Yes    Alcohol/week: 0.0 standard drinks    Comment: very rare and a little wine  . Drug use: No  . Sexual activity: Yes  Lifestyle  . Physical activity:    Days per week: Not on file    Minutes per session: Not on file  . Stress: Not on file  Relationships  . Social connections:    Talks on phone: Not on file    Gets together: Not on file    Attends religious service: Not on file    Active member of club or organization: Not on file    Attends meetings of clubs or organizations: Not on file    Relationship status: Not on file  Other Topics Concern  . Not on file  Social History Narrative   Work or School: retired - used to work in [place] Situation: lives with husband      Spiritual Beliefs: Christian      Lifestyle: walks and goes to the gym - diet is healthy             Outpatient Encounter Medications as of 10/05/2017  Medication Sig  . ammonium lactate (LAC-HYDRIN) 12 % lotion Apply topically daily.  . budesonide-formoterol (SYMBICORT) 160-4.5 MCG/ACT inhaler Inhale 2 puffs into the lungs as needed.   . desvenlafaxine (PRISTIQ) 50 MG 24 hr tablet Take 1 tablet (50 mg total) by mouth daily.  Marland Kitchen levocetirizine (XYZAL) 5 MG tablet Take 5 mg by  mouth every evening.  . magnesium oxide (MAG-OX) 400 MG  tablet Take 1 tablet (400 mg total) by mouth daily.  . montelukast (SINGULAIR) 10 MG tablet Take 10 mg by mouth at bedtime.  . pravastatin (PRAVACHOL) 40 MG tablet TAKE ONE TABLET BY MOUTH ONE TIME DAILY   . Probiotic Product (ALIGN) 4 MG CAPS Take by mouth.  . SYNTHROID 137 MCG tablet TAKE ONE TABLET BY MOUTH ONE TIME DAILY  before breakfast  . traZODone (DESYREL) 50 MG tablet Take 50 mg by mouth at bedtime as needed for sleep.  Marland Kitchen albuterol (PROVENTIL HFA;VENTOLIN HFA) 108 (90 Base) MCG/ACT inhaler Inhale 2 puffs into the lungs every 6 (six) hours as needed.   . [DISCONTINUED] Amlodipine-Valsartan-HCTZ 5-160-12.5 MG TABS Take 1 tablet by mouth daily.  . [DISCONTINUED] Rivaroxaban 20 MG TABS Take 20 mg by mouth daily.   No facility-administered encounter medications on file as of 10/05/2017.     Activities of Daily Living In your present state of health, do you have any difficulty performing the following activities: 10/05/2017  Hearing? N  Vision? N  Difficulty concentrating or making decisions? N  Walking or climbing stairs? N  Dressing or bathing? N  Doing errands, shopping? N  Preparing Food and eating ? N  Using the Toilet? N  In the past six months, have you accidently leaked urine? N  Do you have problems with loss of bowel control? N  Managing your Medications? N  Managing your Finances? N  Housekeeping or managing your Housekeeping? N  Some recent data might be hidden    Patient Care Team: [name], DO as PCP - General (Family Medicine) Deboraha Sprang, MD as PCP - Cardiology (Cardiology)    Assessment:   This is a routine wellness examination for August.  Exercise Activities and Dietary recommendations On hold due to recent episodes but has reached out to a personal trainer  Current Exercise Habits: Home exercise routine, Intensity: Moderate(likes to exercise ), Exercise limited by: Other - see comments(in  process of being evaulated )  Goals    . Patient Stated     Feel normal again.        Fall Risk Fall Risk  10/05/2017 07/31/2016 12/27/2015 07/30/2014 04/24/2013  Falls in the past year? Yes No No No No  Comment - - Emmi Telephone Survey: data to providers prior to load - -  Number falls in past yr: 1 - - - -     Depression Screen PHQ 2/9 Scores 10/05/2017 07/20/2017 05/27/2017 07/31/2016  PHQ - 2 Score 0 0 0 0  PHQ- 9 Score - 2 1 -    Some what discouraged All of a sudden feels agitated   Cognitive Function MMSE - Mini Mental State Exam 10/05/2017  Not completed: (No Data)   Ad8 score reviewed for issues:  Issues making decisions:  Less interest in hobbies / activities:  Repeats questions, stories (family complaining):  Trouble using ordinary gadgets (microwave, computer, phone):  Forgets the month or year:   Mismanaging finances:   Remembering appts:  Daily problems with thinking and/or memory: Ad8 score is=0          Immunization History  Administered Date(s) Administered  . Influenza Split 10/10/2010  . Influenza Whole 10/11/2009  . Influenza, High Dose Seasonal PF 12/04/2015, 11/02/2016  . Influenza,inj,Quad PF,6+ Mos 10/24/2012, 10/24/2013  . Pneumococcal Conjugate-13 10/24/2013  . Pneumococcal Polysaccharide-23 11/02/2016  . Td 11/21/2003  . Tdap 01/25/2014      Screening Tests Health Maintenance  Topic Date Due  .  INFLUENZA VACCINE  03/09/2018 (Originally 08/19/2017)  . MAMMOGRAM  04/30/2018  . COLONOSCOPY  03/22/2019  . TETANUS/TDAP  01/26/2024  . DEXA SCAN  Completed  . Hepatitis C Screening  Completed  . PNA vac Low Risk Adult  Completed      Plan:      PCP Notes   Health Maintenance colonoscopy 03/2014; Tubular adenoma - due again 03/2019  Pap 03/05/2015  Mammogram 04/2017   Was told to wait until 10/10 OV with Dr. Maudie Mercury to take her flu vaccine    Abnormal Screens  As noted  Referrals  none  Patient concerns; Reports health  as challenged recently About one year ago she fainted in the middle of the night Noted changes in vision and then hit the floor and woke up   Fup with EP cardiologist - seeing Dr. Loma Sender cardia x 2 to 3 in the last year and the last episode started Saturday.She did not call cardiology but instructed to do so today  BP dropped and HR dropped; gets light headed w mild nausea  Saw Dr. Maudie Mercury stools are lose and long skinny- formed; no odor or color changes.  Started x 3 weeks ago; off prednisone by then but was on a lot of meds for shingles which interfered with her diet .   Recent shingles of the head but now resolving with residual itching   Nurse Concerns; As noted   Next PCP apt Discussed with Dr. Maudie Mercury The patient was told to call her cardiologist regarding her most recent episode of bradycardia this past Saturday and she agreed to do so.  Offered apt with Dr. Maudie Mercury if she wanted to discuss her case or need feedback. She is seeing a neurologist 10/8 for familiar tremors of the head. Will be seeing Dr. Maudie Mercury 10/10    I have personally reviewed and noted the following in the patient's chart:   . Medical and social history . Use of alcohol, tobacco or illicit drugs  . Current medications and supplements . Functional ability and status . Nutritional status . Physical activity . Advanced directives . List of other physicians . Hospitalizations, surgeries, and ER visits in previous 12 months . Vitals . Screenings to include cognitive, depression, and falls . Referrals and appointments  In addition, I have reviewed and discussed with patient certain preventive protocols, quality metrics, and best practice recommendations. A written personalized care plan for preventive services as well as general preventive health recommendations were provided to patient.     Wynetta Fines, RN  10/05/2017

## 2017-10-05 ENCOUNTER — Ambulatory Visit (INDEPENDENT_AMBULATORY_CARE_PROVIDER_SITE_OTHER): Payer: Medicare Other

## 2017-10-05 VITALS — BP 140/80 | HR 58 | Ht 69.0 in | Wt 176.0 lb

## 2017-10-05 DIAGNOSIS — Z0000 Encounter for general adult medical examination without abnormal findings: Secondary | ICD-10-CM | POA: Diagnosis not present

## 2017-10-05 NOTE — Progress Notes (Signed)
Hannah R Kim, DO  

## 2017-10-05 NOTE — Patient Instructions (Addendum)
Ms. Lucente , Thank you for taking time to come for your Medicare Wellness Visit. I appreciate your ongoing commitment to your health goals. Please review the following plan we discussed and let me know if I can assist you in the future.   Will take shingrix as directed by primary   To follow up with Dr. Maudie Mercury anytime you feel bradycardia or light headed    These are the goals we discussed: Goals    . Patient Stated     Feel normal again.        This is a list of the screening recommended for you and due dates:  Health Maintenance  Topic Date Due  . Flu Shot  03/09/2018*  . Mammogram  04/30/2018  . Colon Cancer Screening  03/22/2019  . Tetanus Vaccine  01/26/2024  . DEXA scan (bone density measurement)  Completed  .  Hepatitis C: One time screening is recommended by Center for Disease Control  (CDC) for  adults born from [birth year] through 1967.   Completed  . Pneumonia vaccines  Completed  *Topic was postponed. The date shown is not the original due date.      Fall Prevention in the Home Falls can cause injuries. They can happen to people of all ages. There are many things you can do to make your home safe and to help prevent falls. What can I do on the outside of my home?  Regularly fix the edges of walkways and driveways and fix any cracks.  Remove anything that might make you trip as you walk through a door, such as a raised step or threshold.  Trim any bushes or trees on the path to your home.  Use bright outdoor lighting.  Clear any walking paths of anything that might make someone trip, such as rocks or tools.  Regularly check to see if handrails are loose or broken. Make sure that both sides of any steps have handrails.  Any raised decks and porches should have guardrails on the edges.  Have any leaves, snow, or ice cleared regularly.  Use sand or salt on walking paths during winter.  Clean up any spills in your garage right away. This includes oil or grease  spills. What can I do in the bathroom?  Use night lights.  Install grab bars by the toilet and in the tub and shower. Do not use towel bars as grab bars.  Use non-skid mats or decals in the tub or shower.  If you need to sit down in the shower, use a plastic, non-slip stool.  Keep the floor dry. Clean up any water that spills on the floor as soon as it happens.  Remove soap buildup in the tub or shower regularly.  Attach bath mats securely with double-sided non-slip rug tape.  Do not have throw rugs and other things on the floor that can make you trip. What can I do in the bedroom?  Use night lights.  Make sure that you have a light by your bed that is easy to reach.  Do not use any sheets or blankets that are too big for your bed. They should not hang down onto the floor.  Have a firm chair that has side arms. You can use this for support while you get dressed.  Do not have throw rugs and other things on the floor that can make you trip. What can I do in the kitchen?  Clean up any spills right away.  Avoid walking  on wet floors.  Keep items that you use a lot in easy-to-reach places.  If you need to reach something above you, use a strong step stool that has a grab bar.  Keep electrical cords out of the way.  Do not use floor polish or wax that makes floors slippery. If you must use wax, use non-skid floor wax.  Do not have throw rugs and other things on the floor that can make you trip. What can I do with my stairs?  Do not leave any items on the stairs.  Make sure that there are handrails on both sides of the stairs and use them. Fix handrails that are broken or loose. Make sure that handrails are as long as the stairways.  Check any carpeting to make sure that it is firmly attached to the stairs. Fix any carpet that is loose or worn.  Avoid having throw rugs at the top or bottom of the stairs. If you do have throw rugs, attach them to the floor with carpet  tape.  Make sure that you have a light switch at the top of the stairs and the bottom of the stairs. If you do not have them, ask someone to add them for you. What else can I do to help prevent falls?  Wear shoes that: ? Do not have high heels. ? Have rubber bottoms. ? Are comfortable and fit you well. ? Are closed at the toe. Do not wear sandals.  If you use a stepladder: ? Make sure that it is fully opened. Do not climb a closed stepladder. ? Make sure that both sides of the stepladder are locked into place. ? Ask someone to hold it for you, if possible.  Clearly mark and make sure that you can see: ? Any grab bars or handrails. ? First and last steps. ? Where the edge of each step is.  Use tools that help you move around (mobility aids) if they are needed. These include: ? Canes. ? Walkers. ? Scooters. ? Crutches.  Turn on the lights when you go into a dark area. Replace any light bulbs as soon as they burn out.  Set up your furniture so you have a clear path. Avoid moving your furniture around.  If any of your floors are uneven, fix them.  If there are any pets around you, be aware of where they are.  Review your medicines with your doctor. Some medicines can make you feel dizzy. This can increase your chance of falling. Ask your doctor what other things that you can do to help prevent falls. This information is not intended to replace advice given to you by your health care provider. Make sure you discuss any questions you have with your health care provider. Document Released: 11/01/2008 Document Revised: 06/13/2015 Document Reviewed: 02/09/2014 Elsevier Interactive Patient Education  2018 [place] Maintenance, Female Adopting a healthy lifestyle and getting preventive care can go a long way to promote health and wellness. Talk with your health care provider about what schedule of regular examinations is right for you. This is a good chance for you to  check in with your provider about disease prevention and staying healthy. In between checkups, there are plenty of things you can do on your own. Experts have done a lot of research about which lifestyle changes and preventive measures are most likely to keep you healthy. Ask your health care provider for more information. Weight and diet Eat a healthy diet  Be sure to include plenty of vegetables, fruits, low-fat dairy products, and lean protein.  Do not eat a lot of foods high in solid fats, added sugars, or salt.  Get regular exercise. This is one of the most important things you can do for your health. ? Most adults should exercise for at least 150 minutes each week. The exercise should increase your heart rate and make you sweat (moderate-intensity exercise). ? Most adults should also do strengthening exercises at least twice a week. This is in addition to the moderate-intensity exercise.  Maintain a healthy weight  Body mass index (BMI) is a measurement that can be used to identify possible weight problems. It estimates body fat based on height and weight. Your health care provider can help determine your BMI and help you achieve or maintain a healthy weight.  For females 26 years of age and older: ? A BMI below 18.5 is considered underweight. ? A BMI of 18.5 to 24.9 is normal. ? A BMI of 25 to 29.9 is considered overweight. ? A BMI of 30 and above is considered obese.  Watch levels of cholesterol and blood lipids  You should start having your blood tested for lipids and cholesterol at 69 years of age, then have this test every 5 years.  You may need to have your cholesterol levels checked more often if: ? Your lipid or cholesterol levels are high. ? You are older than 69 years of age. ? You are at high risk for heart disease.  Cancer screening Lung Cancer  Lung cancer screening is recommended for adults 70-72 years old who are at high risk for lung cancer because of a  history of smoking.  A yearly low-dose CT scan of the lungs is recommended for people who: ? Currently smoke. ? Have quit within the past 15 years. ? Have at least a 30-pack-year history of smoking. A pack year is smoking an average of one pack of cigarettes a day for 1 year.  Yearly screening should continue until it has been 15 years since you quit.  Yearly screening should stop if you develop a health problem that would prevent you from having lung cancer treatment.  Breast Cancer  Practice breast self-awareness. This means understanding how your breasts normally appear and feel.  It also means doing regular breast self-exams. Let your health care provider know about any changes, no matter how small.  If you are in your 20s or 30s, you should have a clinical breast exam (CBE) by a health care provider every 1-3 years as part of a regular health exam.  If you are 87 or older, have a CBE every year. Also consider having a breast X-ray (mammogram) every year.  If you have a family history of breast cancer, talk to your health care provider about genetic screening.  If you are at high risk for breast cancer, talk to your health care provider about having an MRI and a mammogram every year.  Breast cancer gene (BRCA) assessment is recommended for women who have family members with BRCA-related cancers. BRCA-related cancers include: ? Breast. ? Ovarian. ? Tubal. ? Peritoneal cancers.  Results of the assessment will determine the need for genetic counseling and BRCA1 and BRCA2 testing.  Cervical Cancer Your health care provider may recommend that you be screened regularly for cancer of the pelvic organs (ovaries, uterus, and vagina). This screening involves a pelvic examination, including checking for microscopic changes to the surface of your cervix (Pap test).  You may be encouraged to have this screening done every 3 years, beginning at age 33.  For women ages 17-65, health care  providers may recommend pelvic exams and Pap testing every 3 years, or they may recommend the Pap and pelvic exam, combined with testing for human papilloma virus (HPV), every 5 years. Some types of HPV increase your risk of cervical cancer. Testing for HPV may also be done on women of any age with unclear Pap test results.  Other health care providers may not recommend any screening for nonpregnant women who are considered low risk for pelvic cancer and who do not have symptoms. Ask your health care provider if a screening pelvic exam is right for you.  If you have had past treatment for cervical cancer or a condition that could lead to cancer, you need Pap tests and screening for cancer for at least 20 years after your treatment. If Pap tests have been discontinued, your risk factors (such as having a new sexual partner) need to be reassessed to determine if screening should resume. Some women have medical problems that increase the chance of getting cervical cancer. In these cases, your health care provider may recommend more frequent screening and Pap tests.  Colorectal Cancer  This type of cancer can be detected and often prevented.  Routine colorectal cancer screening usually begins at 69 years of age and continues through 69 years of age.  Your health care provider may recommend screening at an earlier age if you have risk factors for colon cancer.  Your health care provider may also recommend using home test kits to check for hidden blood in the stool.  A small camera at the end of a tube can be used to examine your colon directly (sigmoidoscopy or colonoscopy). This is done to check for the earliest forms of colorectal cancer.  Routine screening usually begins at age 45.  Direct examination of the colon should be repeated every 5-10 years through 69 years of age. However, you may need to be screened more often if early forms of precancerous polyps or small growths are found.  Skin  Cancer  Check your skin from head to toe regularly.  Tell your health care provider about any new moles or changes in moles, especially if there is a change in a mole's shape or color.  Also tell your health care provider if you have a mole that is larger than the size of a pencil eraser.  Always use sunscreen. Apply sunscreen liberally and repeatedly throughout the day.  Protect yourself by wearing long sleeves, pants, a wide-brimmed hat, and sunglasses whenever you are outside.  Heart disease, diabetes, and high blood pressure  High blood pressure causes heart disease and increases the risk of stroke. High blood pressure is more likely to develop in: ? People who have blood pressure in the high end of the normal range (130-139/85-89 mm Hg). ? People who are overweight or obese. ? People who are African American.  If you are 11-1 years of age, have your blood pressure checked every 3-5 years. If you are 56 years of age or older, have your blood pressure checked every year. You should have your blood pressure measured twice-once when you are at a hospital or clinic, and once when you are not at a hospital or clinic. Record the average of the two measurements. To check your blood pressure when you are not at a hospital or clinic, you can use: ? An automated blood pressure  machine at a pharmacy. ? A home blood pressure monitor.  If you are between 31 years and 51 years old, ask your health care provider if you should take aspirin to prevent strokes.  Have regular diabetes screenings. This involves taking a blood sample to check your fasting blood sugar level. ? If you are at a normal weight and have a low risk for diabetes, have this test once every three years after 69 years of age. ? If you are overweight and have a high risk for diabetes, consider being tested at a younger age or more often. Preventing infection Hepatitis B  If you have a higher risk for hepatitis B, you should be  screened for this virus. You are considered at high risk for hepatitis B if: ? You were born in a country where hepatitis B is common. Ask your health care provider which countries are considered high risk. ? Your parents were born in a high-risk country, and you have not been immunized against hepatitis B (hepatitis B vaccine). ? You have HIV or AIDS. ? You use needles to inject street drugs. ? You live with someone who has hepatitis B. ? You have had sex with someone who has hepatitis B. ? You get hemodialysis treatment. ? You take certain medicines for conditions, including cancer, organ transplantation, and autoimmune conditions.  Hepatitis C  Blood testing is recommended for: ? Everyone born from [birth year] through 1967. ? Anyone with known risk factors for hepatitis C.  Sexually transmitted infections (STIs)  You should be screened for sexually transmitted infections (STIs) including gonorrhea and chlamydia if: ? You are sexually active and are younger than 69 years of age. ? You are older than 69 years of age and your health care provider tells you that you are at risk for this type of infection. ? Your sexual activity has changed since you were last screened and you are at an increased risk for chlamydia or gonorrhea. Ask your health care provider if you are at risk.  If you do not have HIV, but are at risk, it may be recommended that you take a prescription medicine daily to prevent HIV infection. This is called pre-exposure prophylaxis (PrEP). You are considered at risk if: ? You are sexually active and do not regularly use condoms or know the HIV status of your partner(s). ? You take drugs by injection. ? You are sexually active with a partner who has HIV.  Talk with your health care provider about whether you are at high risk of being infected with HIV. If you choose to begin PrEP, you should first be tested for HIV. You should then be tested every 3 months for as long as you are  taking PrEP. Pregnancy  If you are premenopausal and you may become pregnant, ask your health care provider about preconception counseling.  If you may become pregnant, take 400 to 800 micrograms (mcg) of folic acid every day.  If you want to prevent pregnancy, talk to your health care provider about birth control (contraception). Osteoporosis and menopause  Osteoporosis is a disease in which the bones lose minerals and strength with aging. This can result in serious bone fractures. Your risk for osteoporosis can be identified using a bone density scan.  If you are 41 years of age or older, or if you are at risk for osteoporosis and fractures, ask your health care provider if you should be screened.  Ask your health care provider whether you should take a  calcium or vitamin D supplement to lower your risk for osteoporosis.  Menopause may have certain physical symptoms and risks.  Hormone replacement therapy may reduce some of these symptoms and risks. Talk to your health care provider about whether hormone replacement therapy is right for you. Follow these instructions at home:  Schedule regular health, dental, and eye exams.  Stay current with your immunizations.  Do not use any tobacco products including cigarettes, chewing tobacco, or electronic cigarettes.  If you are pregnant, do not drink alcohol.  If you are breastfeeding, limit how much and how often you drink alcohol.  Limit alcohol intake to no more than 1 drink per day for nonpregnant women. One drink equals 12 ounces of beer, 5 ounces of wine, or 1 ounces of hard liquor.  Do not use street drugs.  Do not share needles.  Ask your health care provider for help if you need support or information about quitting drugs.  Tell your health care provider if you often feel depressed.  Tell your health care provider if you have ever been abused or do not feel safe at home. This information is not intended to replace  advice given to you by your health care provider. Make sure you discuss any questions you have with your health care provider. Document Released: 07/21/2010 Document Revised: 06/13/2015 Document Reviewed: 10/09/2014 Elsevier Interactive Patient Education  [employer].

## 2017-10-08 ENCOUNTER — Encounter: Payer: Self-pay | Admitting: Family Medicine

## 2017-10-21 ENCOUNTER — Ambulatory Visit: Payer: Medicare Other | Admitting: Family Medicine

## 2017-10-26 DIAGNOSIS — G250 Essential tremor: Secondary | ICD-10-CM | POA: Diagnosis not present

## 2017-10-28 ENCOUNTER — Encounter: Payer: Self-pay | Admitting: Family Medicine

## 2017-10-28 ENCOUNTER — Ambulatory Visit (INDEPENDENT_AMBULATORY_CARE_PROVIDER_SITE_OTHER): Payer: Medicare Other | Admitting: Family Medicine

## 2017-10-28 VITALS — BP 128/68 | HR 54 | Temp 98.4°F | Wt 174.8 lb

## 2017-10-28 DIAGNOSIS — I10 Essential (primary) hypertension: Secondary | ICD-10-CM

## 2017-10-28 DIAGNOSIS — F320 Major depressive disorder, single episode, mild: Secondary | ICD-10-CM

## 2017-10-28 DIAGNOSIS — E039 Hypothyroidism, unspecified: Secondary | ICD-10-CM | POA: Diagnosis not present

## 2017-10-28 DIAGNOSIS — F419 Anxiety disorder, unspecified: Secondary | ICD-10-CM

## 2017-10-28 DIAGNOSIS — I472 Ventricular tachycardia, unspecified: Secondary | ICD-10-CM

## 2017-10-28 DIAGNOSIS — Z23 Encounter for immunization: Secondary | ICD-10-CM

## 2017-10-28 DIAGNOSIS — E785 Hyperlipidemia, unspecified: Secondary | ICD-10-CM

## 2017-10-28 DIAGNOSIS — F32A Depression, unspecified: Secondary | ICD-10-CM

## 2017-10-28 LAB — TSH: TSH: 2.28 u[IU]/mL (ref 0.35–4.50)

## 2017-10-28 NOTE — Progress Notes (Signed)
HPI:  Using dictation device. Unfortunately this device frequently misinterprets words/phrases.  Erica Marquez is a pleasant 67 y.o. here for follow up. Chronic medical problems summarized below were reviewed for changes and stability and were updated as needed below. These issues and their treatment remain stable for the most part.  Reports she is doing okay in terms of her heart and other issues.  Is getting regular exercise and eating healthy.  However, ever since she had shingles earlier this year.  Evaluation for her heart, she has had some mood issues.  She takes it Pristiq and trazodone for prior depression.  However had been stable for quite some time.  Actually had reduced dose of Pristiq to 50.  Symptoms include mild depression, intermittent anxiety, irritability.  Denies thoughts of harm, suicidal ideation, hallucinations, manic symptoms or any severe symptoms. Denies CP, SOB, DOE, treatment intolerance or new symptoms. AWV 09/2917  PVCs/PACs/Ventricular tachycardia/Bradycardia/Syncope 2019/HTN: -s/p evaluation at [facility] 2019 and was seeing Dr. Caryl Comes for management -no seeing Dr. Georg Ruddle at [facility] in 2019  Hypertension: -Medications: losartan -stopped in 2019 by [facility]ologist [facility], Dr. Georg Ruddle -did not tolerate diuretic in the past   Hyperlipidemia: -Medications: pravastatin  Hypothyroidism: -Medications: levothyroxine  Asthma and allergies: -Medications: albuterol as needed, Symbicort, Zyrtec, Xyzal, Singulair  Acid reflux: -Medications: ranitidine  Depression: -Medications: Pristiq  IBS: -sees GI  Essential Tremor: -saw neurologist about this for confirmation dx in 2019   ROS: See pertinent positives and negatives per HPI.  Past Medical History:  Diagnosis Date  . Allergy   . Arthritis   . Asthma   . BACK PAIN, LUMBAR, WITH RADICULOPATHY 09/12/2007   Qualifier: Diagnosis of  By: Arnoldo Morale MD, John E   . Blunt head trauma   . Chest pain    s/p  extensive nag cardiac eval with cath, stress test and sees Dr. Jens Som  . Depression   . Diverticulosis of colon   . Edema 08/12/2009   Qualifier: Diagnosis of  By: Arnoldo Morale MD, Balinda Quails   . Fibroids   . GERD (gastroesophageal reflux disease)    hiatal hernia  . Hyperlipidemia   . Hypertension   . Hypothyroidism 03/26/2014  . IBS (irritable bowel syndrome)   . Pelvic pain in female, chronic 10/24/2013  . Premature ventricular contractions   . Schatzki's ring    on EGD 2016  . Tubular adenoma of colon 03/2014    Past Surgical History:  Procedure Laterality Date  . BREAST SURGERY     fibroid tumors  . DILATION AND CURETTAGE OF UTERUS    . KNEE CARTILAGE SURGERY Right 03/2015   Torn miniscus    Family History  Problem Relation Age of Onset  . Coronary artery disease Mother   . Heart disease Mother   . Colon polyps Mother   . Diabetes Mother   . Stomach cancer Father   . Colon cancer Neg Hx   . Kidney disease Neg Hx   . Esophageal cancer Neg Hx   . Gallbladder disease Neg Hx     SOCIAL HX: See HPI   Current Outpatient Medications:  .  albuterol (PROVENTIL HFA;VENTOLIN HFA) 108 (90 Base) MCG/ACT inhaler, Inhale 2 puffs into the lungs every 6 (six) hours as needed. , Disp: , Rfl:  .  ammonium lactate (LAC-HYDRIN) 12 % lotion, Apply topically daily., Disp: , Rfl:  .  budesonide-formoterol (SYMBICORT) 160-4.5 MCG/ACT inhaler, Inhale 2 puffs into the lungs as needed. , Disp: , Rfl:  .  desvenlafaxine (PRISTIQ) 50 MG 24 hr tablet, Take 1 tablet (50 mg total) by mouth daily., Disp: 90 tablet, Rfl: 1 .  levocetirizine (XYZAL) 5 MG tablet, Take 5 mg by mouth every evening., Disp: , Rfl:  .  magnesium oxide (MAG-OX) 400 MG tablet, Take 1 tablet (400 mg total) by mouth daily., Disp: 90 tablet, Rfl: 3 .  montelukast (SINGULAIR) 10 MG tablet, Take 10 mg by mouth at bedtime., Disp: , Rfl:  .  pravastatin (PRAVACHOL) 40 MG tablet, TAKE ONE TABLET BY MOUTH ONE TIME DAILY , Disp: 90 tablet, Rfl:  1 .  Probiotic Product (ALIGN) 4 MG CAPS, Take by mouth., Disp: , Rfl:  .  SYNTHROID 137 MCG tablet, TAKE ONE TABLET BY MOUTH ONE TIME DAILY  before breakfast, Disp: 90 tablet, Rfl: 1 .  traZODone (DESYREL) 50 MG tablet, Take 50 mg by mouth at bedtime as needed for sleep., Disp: , Rfl:   EXAM:  Vitals:   10/28/17 0946  BP: 128/68  Pulse: (!) 54  Temp: 98.4 F (36.9 C)  SpO2: 98%    Body mass index is 25.81 kg/m.  GENERAL: vitals reviewed and listed above, alert, oriented, appears well hydrated and in no acute distress  HEENT: atraumatic, conjunttiva clear, no obvious abnormalities on inspection of external nose and ears  NECK: no obvious masses on inspection  LUNGS: clear to auscultation bilaterally, no wheezes, rales or rhonchi, good air movement  CV: HRRR, no peripheral edema  MS: moves all extremities without noticeable abnormality  PSYCH: pleasant and cooperative, no obvious depression or anxiety  ASSESSMENT AND PLAN:  Discussed the following assessment and plan:  Mild depression (HCC)  Anxiety  Hyperlipidemia, unspecified hyperlipidemia type  Essential hypertension  Ventricular tachyarrhythmia (HCC)  Hypothyroidism, unspecified type - Plan: TSH  -Discussed treatment options for anxiety and depression, suggested CBT before increasing medications.  She would like to try this.  Number provided to call so that she can schedule with psychology.  Advised prompt evaluation of any worsening or new concerns or not improving with treatment.  See PHQ 9. -Check thyroid today given mood changes -Regular exercise and healthy diet, continue current medications otherwise -Follow-up 3 to 4 months  Patient Instructions  BEFORE YOU LEAVE: -PHQ9 in epic -lab to check TSH -follow up: 3-4 months  Call to schedule cognitive behavioral therapy with Dennison Bulla.  Please let us know if any worsening of mood symptoms.  Please follow-up sooner as needed if any concerns.  We  have ordered labs or studies at this visit. It can take up to 1-2 weeks for results and processing. IF results require follow up or explanation, we will call you with instructions. Clinically stable results will be released to your [facility]. If you have not heard from [place] or cannot find your results in [facility] in 2 weeks please contact our office at [phone].  If you are not yet signed up for [facility], please consider signing up.   We recommend the following healthy lifestyle for LIFE: 1) Small portions. But, make sure to get regular (at least 3 per day), healthy meals and small healthy snacks if needed.  2) Eat a healthy clean diet.   TRY TO EAT: -at least 5-7 servings of low sugar, colorful, and nutrient rich vegetables per day (not corn, potatoes or bananas.) -berries are the best choice if you wish to eat fruit (only eat small amounts if trying to reduce weight)  -lean meets (fish, white meat of chicken or [place]) -vegan  proteins for some meals - beans or tofu, whole grains, nuts and seeds -Replace bad fats with good fats - good fats include: fish, nuts and seeds, canola oil, olive oil -small amounts of low fat or non fat dairy -small amounts of100 % whole grains - check the lables -drink plenty of water  AVOID: -SUGAR, sweets, anything with added sugar, corn syrup or sweeteners - must read labels as even foods advertised as "healthy" often are loaded with sugar -if you must have a sweetener, small amounts of stevia may be best -sweetened beverages and artificially sweetened beverages -simple starches (rice, bread, potatoes, pasta, chips, etc - small amounts of 100% whole grains are ok) -red meat, pork, butter -fried foods, fast food, processed food, excessive dairy, eggs and coconut.  3)Get at least 150 minutes of sweaty aerobic exercise per week.  4)Reduce stress - consider counseling, meditation and relaxation to balance other aspects of your life.          [name], DO

## 2017-10-28 NOTE — Patient Instructions (Signed)
BEFORE YOU LEAVE: -PHQ9 in epic -lab to check TSH -follow up: 3-4 months  Call to schedule cognitive behavioral therapy with Dennison Bulla.  Please let us know if any worsening of mood symptoms.  Please follow-up sooner as needed if any concerns.  We have ordered labs or studies at this visit. It can take up to 1-2 weeks for results and processing. IF results require follow up or explanation, we will call you with instructions. Clinically stable results will be released to your [facility]. If you have not heard from [place] or cannot find your results in [facility] in 2 weeks please contact our office at [phone].  If you are not yet signed up for [facility], please consider signing up.   We recommend the following healthy lifestyle for LIFE: 1) Small portions. But, make sure to get regular (at least 3 per day), healthy meals and small healthy snacks if needed.  2) Eat a healthy clean diet.   TRY TO EAT: -at least 5-7 servings of low sugar, colorful, and nutrient rich vegetables per day (not corn, potatoes or bananas.) -berries are the best choice if you wish to eat fruit (only eat small amounts if trying to reduce weight)  -lean meets (fish, white meat of chicken or [place]) -vegan proteins for some meals - beans or tofu, whole grains, nuts and seeds -Replace bad fats with good fats - good fats include: fish, nuts and seeds, canola oil, olive oil -small amounts of low fat or non fat dairy -small amounts of100 % whole grains - check the lables -drink plenty of water  AVOID: -SUGAR, sweets, anything with added sugar, corn syrup or sweeteners - must read labels as even foods advertised as "healthy" often are loaded with sugar -if you must have a sweetener, small amounts of stevia may be best -sweetened beverages and artificially sweetened beverages -simple starches (rice, bread, potatoes, pasta, chips, etc - small amounts of 100% whole grains are ok) -red meat, pork, butter -fried foods, fast food,  processed food, excessive dairy, eggs and coconut.  3)Get at least 150 minutes of sweaty aerobic exercise per week.  4)Reduce stress - consider counseling, meditation and relaxation to balance other aspects of your life.

## 2017-11-04 ENCOUNTER — Ambulatory Visit: Payer: Medicare Other

## 2017-11-04 DIAGNOSIS — Y998 Other external cause status: Secondary | ICD-10-CM | POA: Diagnosis not present

## 2017-11-04 DIAGNOSIS — S01511A Laceration without foreign body of lip, initial encounter: Secondary | ICD-10-CM | POA: Diagnosis not present

## 2017-11-04 DIAGNOSIS — S0990XA Unspecified injury of head, initial encounter: Secondary | ICD-10-CM | POA: Diagnosis not present

## 2017-11-04 DIAGNOSIS — W101XXA Fall (on)(from) sidewalk curb, initial encounter: Secondary | ICD-10-CM | POA: Diagnosis not present

## 2017-11-04 DIAGNOSIS — S0511XA Contusion of eyeball and orbital tissues, right eye, initial encounter: Secondary | ICD-10-CM | POA: Diagnosis not present

## 2017-11-04 DIAGNOSIS — G44319 Acute post-traumatic headache, not intractable: Secondary | ICD-10-CM | POA: Diagnosis not present

## 2017-11-04 DIAGNOSIS — S022XXA Fracture of nasal bones, initial encounter for closed fracture: Secondary | ICD-10-CM | POA: Diagnosis not present

## 2017-11-04 DIAGNOSIS — M25531 Pain in right wrist: Secondary | ICD-10-CM | POA: Diagnosis not present

## 2017-11-09 DIAGNOSIS — S01511A Laceration without foreign body of lip, initial encounter: Secondary | ICD-10-CM | POA: Diagnosis not present

## 2017-11-09 DIAGNOSIS — W1800XA Striking against unspecified object with subsequent fall, initial encounter: Secondary | ICD-10-CM | POA: Diagnosis not present

## 2017-11-11 DIAGNOSIS — S01511A Laceration without foreign body of lip, initial encounter: Secondary | ICD-10-CM | POA: Diagnosis not present

## 2017-11-11 DIAGNOSIS — S022XXA Fracture of nasal bones, initial encounter for closed fracture: Secondary | ICD-10-CM | POA: Diagnosis not present

## 2017-11-17 DIAGNOSIS — S022XXA Fracture of nasal bones, initial encounter for closed fracture: Secondary | ICD-10-CM | POA: Diagnosis not present

## 2017-11-22 DIAGNOSIS — S01511D Laceration without foreign body of lip, subsequent encounter: Secondary | ICD-10-CM | POA: Diagnosis not present

## 2017-11-22 DIAGNOSIS — W1800XD Striking against unspecified object with subsequent fall, subsequent encounter: Secondary | ICD-10-CM | POA: Diagnosis not present

## 2017-12-06 ENCOUNTER — Other Ambulatory Visit: Payer: Self-pay | Admitting: Family Medicine

## 2017-12-23 ENCOUNTER — Encounter: Payer: Self-pay | Admitting: Family Medicine

## 2017-12-23 ENCOUNTER — Ambulatory Visit (INDEPENDENT_AMBULATORY_CARE_PROVIDER_SITE_OTHER): Payer: Medicare Other | Admitting: Family Medicine

## 2017-12-23 VITALS — BP 122/74 | HR 54 | Temp 98.2°F | Ht 69.0 in | Wt 179.3 lb

## 2017-12-23 DIAGNOSIS — F3341 Major depressive disorder, recurrent, in partial remission: Secondary | ICD-10-CM

## 2017-12-23 DIAGNOSIS — E039 Hypothyroidism, unspecified: Secondary | ICD-10-CM

## 2017-12-23 DIAGNOSIS — F419 Anxiety disorder, unspecified: Secondary | ICD-10-CM | POA: Diagnosis not present

## 2017-12-23 DIAGNOSIS — S0993XA Unspecified injury of face, initial encounter: Secondary | ICD-10-CM

## 2017-12-23 DIAGNOSIS — E785 Hyperlipidemia, unspecified: Secondary | ICD-10-CM

## 2017-12-23 DIAGNOSIS — I10 Essential (primary) hypertension: Secondary | ICD-10-CM

## 2017-12-23 NOTE — Patient Instructions (Signed)
BEFORE YOU LEAVE: -follow up: 3-4 months (cancel other follow ups if already scheduled prior)   We recommend the following healthy lifestyle for LIFE: 1) Small portions. But, make sure to get regular (at least 3 per day), healthy meals and small healthy snacks if needed.  2) Eat a healthy clean diet.   TRY TO EAT: -at least 5-7 servings of low sugar, colorful, and nutrient rich vegetables per day (not corn, potatoes or bananas.) -berries are the best choice if you wish to eat fruit (only eat small amounts if trying to reduce weight)  -lean meets (fish, white meat of chicken or [place]) -vegan proteins for some meals - beans or tofu, whole grains, nuts and seeds -Replace bad fats with good fats - good fats include: fish, nuts and seeds, canola oil, olive oil -small amounts of low fat or non fat dairy -small amounts of100 % whole grains - check the lables -drink plenty of water  AVOID: -SUGAR, sweets, anything with added sugar, corn syrup or sweeteners - must read labels as even foods advertised as "healthy" often are loaded with sugar -if you must have a sweetener, small amounts of stevia may be best -sweetened beverages and artificially sweetened beverages -simple starches (rice, bread, potatoes, pasta, chips, etc - small amounts of 100% whole grains are ok) -red meat, pork, butter -fried foods, fast food, processed food, excessive dairy, eggs and coconut.  3)Get at least 150 minutes of sweaty aerobic exercise per week.  4)Reduce stress - consider counseling, meditation and relaxation to balance other aspects of your life.

## 2017-12-23 NOTE — Progress Notes (Signed)
HPI:  Using dictation device. Unfortunately this device frequently misinterprets words/phrases.  Erica Marquez is a pleasant 67 y.o. here for follow up anxiety and depression. Some worsening the last few months and opted to add CBT at visit in October. Medications include trazadone 50mg  at night and pristiq 50mg .  Today reports mood has actually been much better after regular prayer. Did not end up doing counseling. See PHQ9. Had mechnical fall over crack in sidewalk in October and had to have tent of the nose and stitches in the lip. Seeing ENT and feels is healing well.  AWV 9/19  PMH:  PVCs/PACs/Ventricular tachycardia/Bradycardia/Syncope 2019/HTN: -s/p evaluation at [facility] andwasseeing Dr. Caryl Comes for management -no seeing Dr. Georg Ruddle at [facility] in 2019  Hypertension: -Medications: losartan-stopped in 2019 by [facility]ologist [facility], Dr. Georg Ruddle -did not tolerate diuretic in the past   Hyperlipidemia: -Medications: pravastatin  Hypothyroidism: -Medications: levothyroxine  Asthma and allergies: -Medications: albuterol as needed, Symbicort, Zyrtec, Xyzal, Singulair  Acid reflux: -Medications: ranitidine  Depression: -Medications: Pristiq, trazadone  IBS: -sees GI  Essential Tremor: -saw neurologist about this for confirmation dx in 2019   ROS: See pertinent positives and negatives per HPI.  Past Medical History:  Diagnosis Date  . Allergy   . Arthritis   . Asthma   . BACK PAIN, LUMBAR, WITH RADICULOPATHY 09/12/2007   Qualifier: Diagnosis of  By: Arnoldo Morale MD, John E   . Blunt head trauma   . Chest pain    s/p extensive nag cardiac eval with cath, stress test and sees Dr. Jens Som  . Depression   . Diverticulosis of colon   . Edema 08/12/2009   Qualifier: Diagnosis of  By: Arnoldo Morale MD, Balinda Quails   . Fibroids   . GERD (gastroesophageal reflux disease)    hiatal hernia  . Hyperlipidemia   . Hypertension   . Hypothyroidism 03/26/2014  . IBS (irritable bowel  syndrome)   . Pelvic pain in female, chronic 10/24/2013  . Premature ventricular contractions   . Schatzki's ring    on EGD 2016  . Tubular adenoma of colon 03/2014    Past Surgical History:  Procedure Laterality Date  . BREAST SURGERY     fibroid tumors  . DILATION AND CURETTAGE OF UTERUS    . KNEE CARTILAGE SURGERY Right 03/2015   Torn miniscus    Family History  Problem Relation Age of Onset  . Coronary artery disease Mother   . Heart disease Mother   . Colon polyps Mother   . Diabetes Mother   . Stomach cancer Father   . Colon cancer Neg Hx   . Kidney disease Neg Hx   . Esophageal cancer Neg Hx   . Gallbladder disease Neg Hx     SOCIAL HX: see hpi   Current Outpatient Medications:  .  albuterol (PROVENTIL HFA;VENTOLIN HFA) 108 (90 Base) MCG/ACT inhaler, Inhale 2 puffs into the lungs every 6 (six) hours as needed. , Disp: , Rfl:  .  ammonium lactate (LAC-HYDRIN) 12 % lotion, Apply topically daily., Disp: , Rfl:  .  budesonide-formoterol (SYMBICORT) 160-4.5 MCG/ACT inhaler, Inhale 2 puffs into the lungs as needed. , Disp: , Rfl:  .  desvenlafaxine (PRISTIQ) 50 MG 24 hr tablet, Take 1 tablet (50 mg total) by mouth daily., Disp: 90 tablet, Rfl: 1 .  levocetirizine (XYZAL) 5 MG tablet, Take 5 mg by mouth every evening., Disp: , Rfl:  .  magnesium oxide (MAG-OX) 400 MG tablet, Take 1 tablet (400 mg total)  by mouth daily., Disp: 90 tablet, Rfl: 3 .  montelukast (SINGULAIR) 10 MG tablet, Take 10 mg by mouth at bedtime., Disp: , Rfl:  .  pravastatin (PRAVACHOL) 40 MG tablet, TAKE ONE TABLET BY MOUTH ONE TIME DAILY , Disp: 90 tablet, Rfl: 1 .  Probiotic Product (ALIGN) 4 MG CAPS, Take by mouth., Disp: , Rfl:  .  SYNTHROID 137 MCG tablet, TAKE ONE TABLET BY MOUTH ONE TIME DAILY BEFORE BREAKFAST, Disp: 90 tablet, Rfl: 1 .  traZODone (DESYREL) 50 MG tablet, Take 50 mg by mouth at bedtime as needed for sleep., Disp: , Rfl:   EXAM:  Vitals:   12/23/17 1336  BP: 122/74  Pulse:  (!) 54  Temp: 98.2 F (36.8 C)    Body mass index is 26.48 kg/m.  GENERAL: vitals reviewed and listed above, alert, oriented, appears well hydrated and in no acute distress  HEENT: minor healing bruise nose, well healed lac lip, conjunttiva clear, no obvious abnormalities on inspection of external nose and ears  NECK: no obvious masses on inspection  LUNGS: clear to auscultation bilaterally, no wheezes, rales or rhonchi, good air movement  CV: HRRR, no peripheral edema  MS: moves all extremities without noticeable abnormality  PSYCH: pleasant and cooperative, no obvious depression or anxiety  ASSESSMENT AND PLAN:  Discussed the following assessment and plan:  Recurrent major depressive disorder, in partial remission ([place])  Essential hypertension  Hyperlipidemia, unspecified hyperlipidemia type  Anxiety  Facial injury, initial encounter  Hypothyroidism, unspecified type  -doing well, recovering from facial trama, mechanical fall - seeing ent -bp a little up on arrival, improved on recheck - cardiologist recommended off medication for the brady -mood good, see phq9, cont current tx -Patient advised to return or notify a doctor immediately if symptoms worsen or persist or new concerns arise.  Patient Instructions  BEFORE YOU LEAVE: -follow up: 3-4 months (cancel other follow ups if already scheduled prior)   We recommend the following healthy lifestyle for LIFE: 1) Small portions. But, make sure to get regular (at least 3 per day), healthy meals and small healthy snacks if needed.  2) Eat a healthy clean diet.   TRY TO EAT: -at least 5-7 servings of low sugar, colorful, and nutrient rich vegetables per day (not corn, potatoes or bananas.) -berries are the best choice if you wish to eat fruit (only eat small amounts if trying to reduce weight)  -lean meets (fish, white meat of chicken or [place]) -vegan proteins for some meals - beans or tofu, whole grains, nuts  and seeds -Replace bad fats with good fats - good fats include: fish, nuts and seeds, canola oil, olive oil -small amounts of low fat or non fat dairy -small amounts of100 % whole grains - check the lables -drink plenty of water  AVOID: -SUGAR, sweets, anything with added sugar, corn syrup or sweeteners - must read labels as even foods advertised as "healthy" often are loaded with sugar -if you must have a sweetener, small amounts of stevia may be best -sweetened beverages and artificially sweetened beverages -simple starches (rice, bread, potatoes, pasta, chips, etc - small amounts of 100% whole grains are ok) -red meat, pork, butter -fried foods, fast food, processed food, excessive dairy, eggs and coconut.  3)Get at least 150 minutes of sweaty aerobic exercise per week.  4)Reduce stress - consider counseling, meditation and relaxation to balance other aspects of your life.     [name], DO

## 2017-12-24 DIAGNOSIS — J4530 Mild persistent asthma, uncomplicated: Secondary | ICD-10-CM | POA: Diagnosis not present

## 2017-12-24 DIAGNOSIS — J3089 Other allergic rhinitis: Secondary | ICD-10-CM | POA: Diagnosis not present

## 2017-12-24 DIAGNOSIS — H1045 Other chronic allergic conjunctivitis: Secondary | ICD-10-CM | POA: Diagnosis not present

## 2017-12-24 DIAGNOSIS — J301 Allergic rhinitis due to pollen: Secondary | ICD-10-CM | POA: Diagnosis not present

## 2017-12-30 DIAGNOSIS — J3489 Other specified disorders of nose and nasal sinuses: Secondary | ICD-10-CM | POA: Diagnosis not present

## 2017-12-30 DIAGNOSIS — S6990XA Unspecified injury of unspecified wrist, hand and finger(s), initial encounter: Secondary | ICD-10-CM | POA: Diagnosis not present

## 2017-12-30 DIAGNOSIS — J342 Deviated nasal septum: Secondary | ICD-10-CM | POA: Diagnosis not present

## 2018-01-03 DIAGNOSIS — M25531 Pain in right wrist: Secondary | ICD-10-CM | POA: Diagnosis not present

## 2018-01-03 DIAGNOSIS — M19131 Post-traumatic osteoarthritis, right wrist: Secondary | ICD-10-CM | POA: Diagnosis not present

## 2018-01-03 DIAGNOSIS — S66891A Other injury of other specified muscles, fascia and tendons at wrist and hand level, right hand, initial encounter: Secondary | ICD-10-CM | POA: Diagnosis not present

## 2018-01-03 DIAGNOSIS — S52611A Displaced fracture of right ulna styloid process, initial encounter for closed fracture: Secondary | ICD-10-CM | POA: Diagnosis not present

## 2018-01-04 DIAGNOSIS — W1800XD Striking against unspecified object with subsequent fall, subsequent encounter: Secondary | ICD-10-CM | POA: Diagnosis not present

## 2018-01-04 DIAGNOSIS — S01511D Laceration without foreign body of lip, subsequent encounter: Secondary | ICD-10-CM | POA: Diagnosis not present

## 2018-01-31 DIAGNOSIS — M19131 Post-traumatic osteoarthritis, right wrist: Secondary | ICD-10-CM | POA: Diagnosis not present

## 2018-02-01 ENCOUNTER — Ambulatory Visit: Payer: Medicare Other | Admitting: Family Medicine

## 2018-02-22 DIAGNOSIS — H5203 Hypermetropia, bilateral: Secondary | ICD-10-CM | POA: Diagnosis not present

## 2018-02-22 DIAGNOSIS — H25013 Cortical age-related cataract, bilateral: Secondary | ICD-10-CM | POA: Diagnosis not present

## 2018-02-22 DIAGNOSIS — H353111 Nonexudative age-related macular degeneration, right eye, early dry stage: Secondary | ICD-10-CM | POA: Diagnosis not present

## 2018-03-24 ENCOUNTER — Ambulatory Visit (INDEPENDENT_AMBULATORY_CARE_PROVIDER_SITE_OTHER): Payer: Medicare Other | Admitting: Family Medicine

## 2018-03-24 ENCOUNTER — Encounter: Payer: Self-pay | Admitting: Family Medicine

## 2018-03-24 VITALS — BP 122/82 | HR 59 | Temp 98.7°F | Ht 69.0 in | Wt 180.7 lb

## 2018-03-24 DIAGNOSIS — F320 Major depressive disorder, single episode, mild: Secondary | ICD-10-CM | POA: Diagnosis not present

## 2018-03-24 DIAGNOSIS — I472 Ventricular tachycardia, unspecified: Secondary | ICD-10-CM

## 2018-03-24 DIAGNOSIS — R001 Bradycardia, unspecified: Secondary | ICD-10-CM | POA: Diagnosis not present

## 2018-03-24 DIAGNOSIS — E039 Hypothyroidism, unspecified: Secondary | ICD-10-CM

## 2018-03-24 DIAGNOSIS — F32A Depression, unspecified: Secondary | ICD-10-CM

## 2018-03-24 DIAGNOSIS — I10 Essential (primary) hypertension: Secondary | ICD-10-CM | POA: Diagnosis not present

## 2018-03-24 DIAGNOSIS — E785 Hyperlipidemia, unspecified: Secondary | ICD-10-CM | POA: Diagnosis not present

## 2018-03-24 LAB — BASIC METABOLIC PANEL
BUN: 19 mg/dL (ref 6–23)
CALCIUM: 9.5 mg/dL (ref 8.4–10.5)
CO2: 31 mEq/L (ref 19–32)
Chloride: 103 mEq/L (ref 96–112)
Creatinine, Ser: 0.83 mg/dL (ref 0.40–1.20)
GFR: 68.03 mL/min (ref >60.00)
Glucose, Bld: 85 mg/dL (ref 70–99)
Potassium: 5.1 mEq/L (ref 3.5–5.1)
SODIUM: 138 meq/L (ref 135–145)

## 2018-03-24 LAB — TSH: TSH: 0.74 u[IU]/mL (ref 0.35–4.50)

## 2018-03-24 LAB — CBC
HCT: 44 % (ref 36.0–46.0)
Hemoglobin: 14.7 g/dL (ref 12.0–15.0)
MCHC: 33.3 g/dL (ref 30.0–36.0)
MCV: 86.2 fl (ref 78.0–100.0)
PLATELETS: 211 10*3/uL (ref 150.0–400.0)
RBC: 5.11 Mil/uL (ref 3.87–5.11)
RDW: 14.3 % (ref 11.5–15.5)
WBC: 6.3 10*3/uL (ref 4.0–10.5)

## 2018-03-24 NOTE — Progress Notes (Signed)
HPI:  Using dictation device. Unfortunately this device frequently misinterprets words/phrases.  Erica Marquez is a pleasant 68 y.o. here for follow up. Chronic medical problems summarized below were reviewed for changes. REports doing well for the most part. Has noticed on apple watch that heart rate can be low in 50s and then up in 130s when exercises, while exercising felt mildly dizzy and contacted her cardiologist and will see them for evaluation of this and her blood pressure tomorrow. No symptoms currently. Had some acid reflux and took ppi for a few weeks and resolved. Now off without trouble. Occasionally has cramps in feet at night. Has been walking on treadmill at the gym.   PVCs/PACs/Ventricular tachycardia/Bradycardia/Syncope 2019/HTN: -s/p evaluation at [facility] andwasseeing Dr. Caryl Comes for management -now seeingDr. Georg Ruddle at [facility] in 2019  Hypertension: -Medications: losartan-stopped in 2019 by cardiologist at [facility], Dr. Georg Ruddle -did not tolerate diuretic in the past   Hyperlipidemia: -Medications: pravastatin  Hypothyroidism: -Medications: levothyroxine  Asthma and allergies: -Medications: albuterol as needed, Symbicort, Zyrtec, Xyzal, Singulair  Acid reflux: -currently no meds  Depression: -Medications: Pristiq, trazadone  IBS: -sees GI  Essential Tremor: -saw neurologist about this for confirmation dx in 2019  ROS: See pertinent positives and negatives per HPI.  Past Medical History:  Diagnosis Date  . Allergy   . Arthritis   . Asthma   . BACK PAIN, LUMBAR, WITH RADICULOPATHY 09/12/2007   Qualifier: Diagnosis of  By: Arnoldo Morale MD, John E   . Blunt head trauma   . Chest pain    s/p extensive nag cardiac eval with cath, stress test and sees Dr. Jens Som  . Depression   . Diverticulosis of colon   . Edema 08/12/2009   Qualifier: Diagnosis of  By: Arnoldo Morale MD, Balinda Quails   . Fibroids   . GERD (gastroesophageal reflux disease)    hiatal hernia   . Hyperlipidemia   . Hypertension   . Hypothyroidism 03/26/2014  . IBS (irritable bowel syndrome)   . Pelvic pain in female, chronic 10/24/2013  . Premature ventricular contractions   . Schatzki's ring    on EGD 2016  . Tubular adenoma of colon 03/2014    Past Surgical History:  Procedure Laterality Date  . BREAST SURGERY     fibroid tumors  . DILATION AND CURETTAGE OF UTERUS    . KNEE CARTILAGE SURGERY Right 03/2015   Torn miniscus    Family History  Problem Relation Age of Onset  . Coronary artery disease Mother   . Heart disease Mother   . Colon polyps Mother   . Diabetes Mother   . Stomach cancer Father   . Colon cancer Neg Hx   . Kidney disease Neg Hx   . Esophageal cancer Neg Hx   . Gallbladder disease Neg Hx     SOCIAL HX: see hpi   Current Outpatient Medications:  .  albuterol (PROVENTIL HFA;VENTOLIN HFA) 108 (90 Base) MCG/ACT inhaler, Inhale 2 puffs into the lungs every 6 (six) hours as needed. , Disp: , Rfl:  .  ammonium lactate (LAC-HYDRIN) 12 % lotion, Apply topically daily., Disp: , Rfl:  .  budesonide-formoterol (SYMBICORT) 160-4.5 MCG/ACT inhaler, Inhale 2 puffs into the lungs as needed. , Disp: , Rfl:  .  desvenlafaxine (PRISTIQ) 50 MG 24 hr tablet, Take 1 tablet (50 mg total) by mouth daily., Disp: 90 tablet, Rfl: 1 .  levocetirizine (XYZAL) 5 MG tablet, Take 5 mg by mouth every evening., Disp: , Rfl:  .  magnesium oxide (MAG-OX) 400 MG tablet, Take 1 tablet (400 mg total) by mouth daily., Disp: 90 tablet, Rfl: 3 .  montelukast (SINGULAIR) 10 MG tablet, Take 10 mg by mouth at bedtime., Disp: , Rfl:  .  pravastatin (PRAVACHOL) 40 MG tablet, TAKE ONE TABLET BY MOUTH ONE TIME DAILY , Disp: 90 tablet, Rfl: 1 .  Probiotic Product (ALIGN) 4 MG CAPS, Take by mouth., Disp: , Rfl:  .  SYNTHROID 137 MCG tablet, TAKE ONE TABLET BY MOUTH ONE TIME DAILY BEFORE BREAKFAST, Disp: 90 tablet, Rfl: 1 .  traZODone (DESYREL) 50 MG tablet, Take 50 mg by mouth at bedtime as  needed for sleep., Disp: , Rfl:   EXAM:  Vitals:   03/24/18 1346  BP: 122/82  Pulse: (!) 59  Temp: 98.7 F (37.1 C)  SpO2: 98%    Body mass index is 26.68 kg/m.  GENERAL: vitals reviewed and listed above, alert, oriented, appears well hydrated and in no acute distress  HEENT: atraumatic, conjunttiva clear, no obvious abnormalities on inspection of external nose and ears  NECK: no obvious masses on inspection  LUNGS: clear to auscultation bilaterally, no wheezes, rales or rhonchi, good air movement  CV: HRRR, no peripheral edema  MS: moves all extremities without noticeable abnormality  PSYCH: pleasant and cooperative, no obvious depression or anxiety  ASSESSMENT AND PLAN:  Discussed the following assessment and plan:  Essential hypertension  Hyperlipidemia, unspecified hyperlipidemia type  Bradycardia  Hypothyroidism, unspecified type  Mild depression ([place])  Ventricular tachyarrhythmia ([place])  -labs per orders given concerns -agreed should see her cardiologist as planned tomorrow for evaluation of the bradycardia, dizziness, BP -discussed causes of foot cramps, options -follow up in 3-4 months -Patient advised to return or notify a doctor immediately if symptoms worsen or persist or new concerns arise.  Patient Instructions  BEFORE YOU LEAVE: -labs -follow up: 3-4 months  See your cardiologist as planned tomorrow.  We have ordered labs or studies at this visit. It can take up to 1-2 weeks for results and processing. IF results require follow up or explanation, we will call you with instructions. Clinically stable results will be released to your [facility]. If you have not heard from [place] or cannot find your results in [facility] in 2 weeks please contact our office at [phone].  If you are not yet signed up for [facility], please consider signing up.           [name], DO

## 2018-03-24 NOTE — Patient Instructions (Signed)
BEFORE YOU LEAVE: -labs -follow up: 3-4 months  See your cardiologist as planned tomorrow.  We have ordered labs or studies at this visit. It can take up to 1-2 weeks for results and processing. IF results require follow up or explanation, we will call you with instructions. Clinically stable results will be released to your [facility]. If you have not heard from [place] or cannot find your results in [facility] in 2 weeks please contact our office at [phone].  If you are not yet signed up for [facility], please consider signing up.

## 2018-03-25 DIAGNOSIS — R001 Bradycardia, unspecified: Secondary | ICD-10-CM | POA: Diagnosis not present

## 2018-03-29 DIAGNOSIS — R001 Bradycardia, unspecified: Secondary | ICD-10-CM | POA: Diagnosis not present

## 2018-04-04 DIAGNOSIS — I493 Ventricular premature depolarization: Secondary | ICD-10-CM | POA: Diagnosis not present

## 2018-04-28 ENCOUNTER — Other Ambulatory Visit: Payer: Self-pay | Admitting: Family Medicine

## 2018-05-10 ENCOUNTER — Other Ambulatory Visit: Payer: Self-pay | Admitting: Family Medicine

## 2018-05-10 MED ORDER — DESVENLAFAXINE SUCCINATE ER 50 MG PO TB24: 50.0000 mg | ORAL_TABLET | Freq: Every day | ORAL | 0 refills | Status: DC

## 2018-05-10 NOTE — Telephone Encounter (Signed)
Can you check to see whom prescribed this in the past? It does not look like we have? These is a potential interaction between this and th pristiq so would want to know how she is taking and make her aware - could talk via phone or video visit if she wants?

## 2018-05-10 NOTE — Telephone Encounter (Signed)
Requested medication (s) are due for refill today: yes  Requested medication (s) are on the active medication list: yes  Last refill:  8 months ago  Future visit scheduled: yes  Notes to clinic: Historical provider    Requested Prescriptions  Pending Prescriptions Disp Refills   traZODone (DESYREL) 50 MG tablet      Sig: Take 1 tablet (50 mg total) by mouth at bedtime as needed for sleep.     Psychiatry: Antidepressants - Serotonin Modulator Passed - 05/10/2018 11:42 AM      Passed - Completed PHQ-2 or PHQ-9 in the last 360 days.      Passed - Valid encounter within last 6 months    Recent Outpatient Visits          1 month ago Essential hypertension   Therapist, music at [facility], [place], DO   4 months ago Recurrent major depressive disorder, in partial remission ([place])   Therapist, music at [facility], [place] R, DO   6 months ago Mild depression ([place])   Therapist, music at [facility], [place], DO   7 months ago [place] at [place], DO   8 months ago [place] at [facility], [employer], DO      Future Appointments            In 2 months Maudie Mercury, [place] at [place], [facility]         Signed Prescriptions Disp Refills   desvenlafaxine (PRISTIQ) 50 MG 24 hr tablet 90 tablet 0    Sig: Take 1 tablet (50 mg total) by mouth daily.     Psychiatry: Antidepressants - SNRI - desvenlafaxine & venlafaxine Failed - 05/10/2018 11:42 AM      Failed - LDL in normal range and within 360 days    LDL Cholesterol  Date Value Ref Range Status  03/01/2017 113 (H) 0 - 99 mg/dL Final         Failed - Total Cholesterol in normal range and within 360 days    Cholesterol  Date Value Ref Range Status  03/01/2017 191 0 - 200 mg/dL Final    Comment:    ATP III Classification       Desirable:  < 200 mg/dL               Borderline High:  200 - 239 mg/dL          High:  > = 240 mg/dL         Failed  - Triglycerides in normal range and within 360 days    Triglycerides  Date Value Ref Range Status  03/01/2017 97.0 0.0 - 149.0 mg/dL Final    Comment:    Normal:  <150 mg/dLBorderline High:  150 - 199 mg/dL         Passed - Completed PHQ-2 or PHQ-9 in the last 360 days.      Passed - Last BP in normal range    BP Readings from Last 1 Encounters:  03/24/18 122/82         Passed - Valid encounter within last 6 months    Recent Outpatient Visits          1 month ago Essential hypertension   Therapist, music at [place], DO   4 months ago Recurrent major depressive disorder, in partial remission ([place])   Therapist, music at [facility], [place], DO  6 months ago Mild depression ([place])   Therapist, music at [facility], [place] R, DO   7 months ago [place] at [facility], [place], DO   8 months ago [place] at [facility], [place], DO      Future Appointments            In 2 months Maudie Mercury, [place] Major, [place] at [facility], [facility]

## 2018-05-11 NOTE — Telephone Encounter (Signed)
Clinic RN did call patient. No answer. LVM for patient to return call

## 2018-05-15 NOTE — Telephone Encounter (Signed)
See if she has time to discuss her trazadone rx at some point via web or phone this week as for some reason it looks like I not prescribed the trazadone in the past and there is a potential interaction with pristiq. I would like to talk with her about this prior to refilling. Thank you.

## 2018-05-16 NOTE — Telephone Encounter (Signed)
Left message on machine for her to return our call.

## 2018-05-19 ENCOUNTER — Ambulatory Visit (INDEPENDENT_AMBULATORY_CARE_PROVIDER_SITE_OTHER): Payer: Medicare Other | Admitting: Family Medicine

## 2018-05-19 ENCOUNTER — Encounter: Payer: Self-pay | Admitting: Family Medicine

## 2018-05-19 ENCOUNTER — Telehealth: Payer: Self-pay | Admitting: *Deleted

## 2018-05-19 ENCOUNTER — Other Ambulatory Visit: Payer: Self-pay

## 2018-05-19 DIAGNOSIS — F339 Major depressive disorder, recurrent, unspecified: Secondary | ICD-10-CM

## 2018-05-19 DIAGNOSIS — K219 Gastro-esophageal reflux disease without esophagitis: Secondary | ICD-10-CM

## 2018-05-19 DIAGNOSIS — G4700 Insomnia, unspecified: Secondary | ICD-10-CM

## 2018-05-19 MED ORDER — TRAZODONE HCL 50 MG PO TABS: 25.0000 mg | ORAL_TABLET | Freq: Every evening | ORAL | 1 refills | Status: DC | PRN

## 2018-05-19 MED ORDER — ESOMEPRAZOLE MAGNESIUM 20 MG PO CPDR: 20.0000 mg | DELAYED_RELEASE_CAPSULE | Freq: Every day | ORAL | Status: DC

## 2018-05-19 NOTE — Telephone Encounter (Signed)
Per office notes from 4/30, I called the pt and left a detailed message the sleep article was mailed to her home address and asked that she call back to schedule a transfer of care visit with Dr Ethlyn Gallery and follow up visit with Dr Maudie Mercury.

## 2018-05-19 NOTE — Progress Notes (Signed)
Virtual Visit via Video Note  I connected with Erica Marquez  on 05/19/18 at 10:30 AM EDT by a video enabled telemedicine application and verified that I am speaking with the correct person using two identifiers. She had the video working, but then was not able to get it to work so visit completed successfully in audio.  Location patient: home Location provider:work or home office Persons participating in the virtual visit: patient, provider  I discussed the limitations of evaluation and management by telemedicine and the availability of in person appointments. The patient expressed understanding and agreed to proceed.   HPI:  Acute visit for Depression Medication management: -medications in the past included Pristiq and trazadone with prior PCP, dep well controlled recently with lower dose of pristiq -she had called in for refill of trazadone, but appears had not been used/refilled since 2013 with prior PCP - had been used with pristiq without issue in the past despite potential for interaction -sodium was normal on last check recently -recommended CBT in the past -reports: has trouble falling asleep and then become very anxious. occ uses 1/2 tablet (very low 25mg  dose ) of the trazodone in these cases and really helps.  -denies:and signs or symptoms of serotonin syndrome, bleeding, sedation, or other symptoms  GERD: -some heartburn intermittently since pandemic she feels is from the diet -knows needs ot improve diet -sometimes more then a few days per week gets heart burn -no reported vomiting, dysphagia, severe symptoms -wonder what she can take  ROS: See pertinent positives and negatives per HPI.  Past Medical History:  Diagnosis Date  . Allergy   . Arthritis   . Asthma   . BACK PAIN, LUMBAR, WITH RADICULOPATHY 09/12/2007   Qualifier: Diagnosis of  By: Arnoldo Morale MD, John E   . Blunt head trauma   . Chest pain    s/p extensive nag cardiac eval with cath, stress test and sees Dr. Jens Som   . Depression   . Diverticulosis of colon   . Edema 08/12/2009   Qualifier: Diagnosis of  By: Arnoldo Morale MD, Balinda Quails   . Fibroids   . GERD (gastroesophageal reflux disease)    hiatal hernia  . Hyperlipidemia   . Hypertension   . Hypothyroidism 03/26/2014  . IBS (irritable bowel syndrome)   . Pelvic pain in female, chronic 10/24/2013  . Premature ventricular contractions   . Schatzki's ring    on EGD 2016  . Tubular adenoma of colon 03/2014    Past Surgical History:  Procedure Laterality Date  . BREAST SURGERY     fibroid tumors  . DILATION AND CURETTAGE OF UTERUS    . KNEE CARTILAGE SURGERY Right 03/2015   Torn miniscus    Family History  Problem Relation Age of Onset  . Coronary artery disease Mother   . Heart disease Mother   . Colon polyps Mother   . Diabetes Mother   . Stomach cancer Father   . Colon cancer Neg Hx   . Kidney disease Neg Hx   . Esophageal cancer Neg Hx   . Gallbladder disease Neg Hx     SOCIAL HX: see hpi   Current Outpatient Medications:  .  albuterol (PROVENTIL HFA;VENTOLIN HFA) 108 (90 Base) MCG/ACT inhaler, Inhale 2 puffs into the lungs every 6 (six) hours as needed. , Disp: , Rfl:  .  ammonium lactate (LAC-HYDRIN) 12 % lotion, Apply topically daily., Disp: , Rfl:  .  budesonide-formoterol (SYMBICORT) 160-4.5 MCG/ACT inhaler, Inhale 2 puffs into the  lungs as needed. , Disp: , Rfl:  .  desvenlafaxine (PRISTIQ) 50 MG 24 hr tablet, Take 1 tablet (50 mg total) by mouth daily., Disp: 90 tablet, Rfl: 0 .  levocetirizine (XYZAL) 5 MG tablet, Take 5 mg by mouth every evening., Disp: , Rfl:  .  magnesium oxide (MAG-OX) 400 MG tablet, Take 1 tablet (400 mg total) by mouth daily., Disp: 90 tablet, Rfl: 3 .  montelukast (SINGULAIR) 10 MG tablet, Take 10 mg by mouth at bedtime., Disp: , Rfl:  .  pravastatin (PRAVACHOL) 40 MG tablet, TAKE ONE TABLET BY MOUTH ONE TIME DAILY , Disp: 90 tablet, Rfl: 0 .  Probiotic Product (ALIGN) 4 MG CAPS, Take by mouth., Disp: ,  Rfl:  .  SYNTHROID 137 MCG tablet, TAKE ONE TABLET BY MOUTH ONE TIME DAILY BEFORE BREAKFAST, Disp: 90 tablet, Rfl: 0 .  esomeprazole (NEXIUM) 20 MG capsule, Take 1 capsule (20 mg total) by mouth daily at 12 noon for 14 days., Disp: , Rfl:  .  traZODone (DESYREL) 50 MG tablet, Take 0.5 tablets (25 mg total) by mouth at bedtime as needed for sleep., Disp: 30 tablet, Rfl: 1  EXAM:  VITALS per patient if applicable: none reported  GENERAL: no audible signs of distress  LUNGS: no audible gasping or SOB  PSYCH/NEURO: pleasant and cooperative, no obvious depression or anxiety, speech and thought processing grossly intact  ASSESSMENT AND PLAN:  Discussed the following assessment and plan:  Depression, recurrent (HCC)  Insomnia, unspecified type  Gastroesophageal reflux disease, esophagitis presence not specified  discussed tx options for insomnia, will send her article on the benefits of sleep hygeine and CBT for sleep issues. Reviewed various behaviors she could practice to improve sleep. Discussed potential interactions with pristiq and trazadone. She is taking a very low dose, lower then in the past and has not had issues so she opted to continue to use in small and rare dosing amounts. For gerd she will try short course of nexium with follow up and meet and greet with Dr. Ethlyn Gallery in a few weeks.    I discussed the assessment and treatment plan with the patient. The patient was provided an opportunity to ask questions and all were answered. The patient agreed with the plan and demonstrated an understanding of the instructions.   The patient was advised to call back or seek an in-person evaluation if the symptoms worsen or if the condition fails to improve as anticipated.   Follow up instructions: Advised assistant [name] to help patient arrange the following: -send her the sleep article -set up TOC with Dr. Ethlyn Gallery in 1-2 weeks and to f/u on GERD -set up virtual follow up with  Dr. Maudie Mercury in 3-4 months on tues or thurs  [name], DO

## 2018-06-09 DIAGNOSIS — R55 Syncope and collapse: Secondary | ICD-10-CM | POA: Diagnosis not present

## 2018-06-09 DIAGNOSIS — I10 Essential (primary) hypertension: Secondary | ICD-10-CM | POA: Diagnosis not present

## 2018-06-30 DIAGNOSIS — J3489 Other specified disorders of nose and nasal sinuses: Secondary | ICD-10-CM | POA: Diagnosis not present

## 2018-07-11 ENCOUNTER — Encounter: Payer: Self-pay | Admitting: Family Medicine

## 2018-07-11 MED ORDER — SYNTHROID 137 MCG PO TABS: ORAL_TABLET | ORAL | 0 refills | Status: DC

## 2018-07-28 ENCOUNTER — Ambulatory Visit: Payer: Medicare Other | Admitting: Family Medicine

## 2018-08-11 ENCOUNTER — Ambulatory Visit: Payer: Medicare Other | Admitting: Family Medicine

## 2018-08-24 ENCOUNTER — Telehealth: Payer: Self-pay | Admitting: Family Medicine

## 2018-08-24 MED ORDER — DESVENLAFAXINE SUCCINATE ER 50 MG PO TB24: 50.0000 mg | ORAL_TABLET | Freq: Every day | ORAL | 0 refills | Status: DC

## 2018-08-24 NOTE — Telephone Encounter (Signed)
See RX request

## 2018-08-24 NOTE — Telephone Encounter (Signed)
Medication Refill - Medication: desvenlafaxine (PRISTIQ) 50 MG 24 hr tablet  Patient would like a 90-day supply  Preferred Pharmacy (with phone number or street name):  [id number] - [place], [place] [phone] (Phone) [phone] (Fax)

## 2018-08-24 NOTE — Telephone Encounter (Signed)
Rx done. 

## 2018-08-29 ENCOUNTER — Encounter: Payer: Self-pay | Admitting: Family Medicine

## 2018-08-30 NOTE — Telephone Encounter (Signed)
Please set her up phone or video visit today or Thursday. Thanks!

## 2018-09-01 ENCOUNTER — Telehealth (INDEPENDENT_AMBULATORY_CARE_PROVIDER_SITE_OTHER): Payer: Medicare Other | Admitting: Family Medicine

## 2018-09-01 ENCOUNTER — Other Ambulatory Visit: Payer: Self-pay

## 2018-09-01 ENCOUNTER — Encounter: Payer: Self-pay | Admitting: Family Medicine

## 2018-09-01 DIAGNOSIS — I10 Essential (primary) hypertension: Secondary | ICD-10-CM | POA: Diagnosis not present

## 2018-09-01 DIAGNOSIS — E785 Hyperlipidemia, unspecified: Secondary | ICD-10-CM

## 2018-09-01 DIAGNOSIS — R635 Abnormal weight gain: Secondary | ICD-10-CM

## 2018-09-01 DIAGNOSIS — K219 Gastro-esophageal reflux disease without esophagitis: Secondary | ICD-10-CM

## 2018-09-01 MED ORDER — PRAVASTATIN SODIUM 40 MG PO TABS: 40.0000 mg | ORAL_TABLET | Freq: Every day | ORAL | 0 refills | Status: DC

## 2018-09-01 MED ORDER — AMLODIPINE BESYLATE 2.5 MG PO TABS: 2.5000 mg | ORAL_TABLET | Freq: Every day | ORAL | 1 refills | Status: DC

## 2018-09-01 NOTE — Progress Notes (Signed)
Virtual Visit via Video Note  I connected with Erica Marquez  on 09/01/18 at 10:20 AM EDT by a video enabled telemedicine application and verified that I am speaking with the correct person using two identifiers.  Location patient: home Location provider:work or home office Persons participating in the virtual visit: patient, provider  I discussed the limitations of evaluation and management by telemedicine and the availability of in person appointments. The patient expressed understanding and agreed to proceed.   HPI:  Acute visit for HTN: -has noticed BP running higher on and off for the last 1 month -she has had increased stress due to weather, [place] -ave 150/74 over the last week or so - she started checking BP because she can feel in her head whenever it is over 150 -148/72 -she has gained some weight with the [place] pandemic (about 10lbs) No reported CP, SOB, DOE  GERD: -taking acid reducer intermittently and this works -wonders if should take daily or another way  HLD: -requests refill on statin  ROS: See pertinent positives and negatives per HPI.  Past Medical History:  Diagnosis Date  . Allergy   . Arthritis   . Asthma   . BACK PAIN, LUMBAR, WITH RADICULOPATHY 09/12/2007   Qualifier: Diagnosis of  By: Arnoldo Morale MD, John E   . Blunt head trauma   . Chest pain    s/p extensive nag cardiac eval with cath, stress test and sees Dr. Jens Som  . Depression   . Diverticulosis of colon   . Edema 08/12/2009   Qualifier: Diagnosis of  By: Arnoldo Morale MD, Balinda Quails   . Fibroids   . GERD (gastroesophageal reflux disease)    hiatal hernia  . Hyperlipidemia   . Hypertension   . Hypothyroidism 03/26/2014  . IBS (irritable bowel syndrome)   . Pelvic pain in female, chronic 10/24/2013  . Premature ventricular contractions   . Schatzki's ring    on EGD 2016  . Tubular adenoma of colon 03/2014    Past Surgical History:  Procedure Laterality Date  . BREAST SURGERY      fibroid tumors  . DILATION AND CURETTAGE OF UTERUS    . KNEE CARTILAGE SURGERY Right 03/2015   Torn miniscus    Family History  Problem Relation Age of Onset  . Coronary artery disease Mother   . Heart disease Mother   . Colon polyps Mother   . Diabetes Mother   . Stomach cancer Father   . Colon cancer Neg Hx   . Kidney disease Neg Hx   . Esophageal cancer Neg Hx   . Gallbladder disease Neg Hx     SOCIAL HX: see hpi   Current Outpatient Medications:  .  albuterol (PROVENTIL HFA;VENTOLIN HFA) 108 (90 Base) MCG/ACT inhaler, Inhale 2 puffs into the lungs every 6 (six) hours as needed. , Disp: , Rfl:  .  ammonium lactate (LAC-HYDRIN) 12 % lotion, Apply topically daily., Disp: , Rfl:  .  budesonide-formoterol (SYMBICORT) 160-4.5 MCG/ACT inhaler, Inhale 2 puffs into the lungs as needed. , Disp: , Rfl:  .  desvenlafaxine (PRISTIQ) 50 MG 24 hr tablet, Take 1 tablet (50 mg total) by mouth daily., Disp: 90 tablet, Rfl: 0 .  levocetirizine (XYZAL) 5 MG tablet, Take 5 mg by mouth every evening., Disp: , Rfl:  .  magnesium oxide (MAG-OX) 400 MG tablet, Take 1 tablet (400 mg total) by mouth daily., Disp: 90 tablet, Rfl: 3 .  montelukast (SINGULAIR) 10 MG tablet,  Take 10 mg by mouth at bedtime., Disp: , Rfl:  .  pravastatin (PRAVACHOL) 40 MG tablet, Take 1 tablet (40 mg total) by mouth daily., Disp: 90 tablet, Rfl: 0 .  Probiotic Product (ALIGN) 4 MG CAPS, Take by mouth., Disp: , Rfl:  .  SYNTHROID 137 MCG tablet, TAKE ONE TABLET BY MOUTH ONE TIME DAILY BEFORE BREAKFAST, Disp: 90 tablet, Rfl: 0 .  traZODone (DESYREL) 50 MG tablet, Take 0.5 tablets (25 mg total) by mouth at bedtime as needed for sleep., Disp: 30 tablet, Rfl: 1 .  amLODipine (NORVASC) 2.5 MG tablet, Take 1 tablet (2.5 mg total) by mouth daily., Disp: 90 tablet, Rfl: 1 .  esomeprazole (NEXIUM) 20 MG capsule, Take 1 capsule (20 mg total) by mouth daily at 12 noon for 14 days., Disp: , Rfl:   EXAM:  VITALS per patient if  applicable: see hpi  GENERAL: alert, oriented, appears well and in no acute distress  HEENT: atraumatic, conjunttiva clear, no obvious abnormalities on inspection of external nose and ears  NECK: normal movements of the head and neck  LUNGS: on inspection no signs of respiratory distress, breathing rate appears normal, no obvious gross SOB, gasping or wheezing  CV: no obvious cyanosis  MS: moves all visible extremities without noticeable abnormality  PSYCH/NEURO: pleasant and cooperative, no obvious depression or anxiety, speech and thought processing grossly intact  ASSESSMENT AND PLAN:  Discussed the following assessment and plan:  Essential hypertension   Weight gain   Gastroesophageal reflux disease, esophagitis presence not specified   Hyperlipidemia, unspecified hyperlipidemia type  -discussed tx options for HTN -advised wt reduction and she plans to start wt watchers and start back to exercising -opted to start Norvasc as well after discussion options, risks; 5mg  daily -advised weight reduction may help GERD as well and advised lifestyle changes for this and intermittent use of Pepcid or Nexium as needed -refilled lipid medication -she wants to stick with me for primary care, she agrees to meet Dr. Ethlyn Gallery so has an in-person PCP; but prefers to do most follow up and acute visit with me virtually. I told her that is fine with me.  -advised follow up in 1 month  I discussed the assessment and treatment plan with the patient. The patient was provided an opportunity to ask questions and all were answered. The patient agreed with the plan and demonstrated an understanding of the instructions.   The patient was advised to call back or seek an in-person evaluation if the symptoms worsen or if the condition fails to improve as anticipated.   Follow up instructions: Advised admin staff to help patient arrange the following: -follow up in 1 month   [name], DO

## 2018-09-09 ENCOUNTER — Other Ambulatory Visit: Payer: Self-pay

## 2018-09-09 ENCOUNTER — Ambulatory Visit (INDEPENDENT_AMBULATORY_CARE_PROVIDER_SITE_OTHER): Payer: Medicare Other | Admitting: Family Medicine

## 2018-09-09 ENCOUNTER — Encounter: Payer: Self-pay | Admitting: Family Medicine

## 2018-09-09 ENCOUNTER — Telehealth: Payer: Self-pay | Admitting: Family Medicine

## 2018-09-09 VITALS — BP 130/80 | HR 50 | Temp 97.0°F | Ht 69.0 in | Wt 181.0 lb

## 2018-09-09 DIAGNOSIS — E785 Hyperlipidemia, unspecified: Secondary | ICD-10-CM | POA: Diagnosis not present

## 2018-09-09 DIAGNOSIS — I10 Essential (primary) hypertension: Secondary | ICD-10-CM | POA: Diagnosis not present

## 2018-09-09 DIAGNOSIS — E039 Hypothyroidism, unspecified: Secondary | ICD-10-CM | POA: Diagnosis not present

## 2018-09-09 DIAGNOSIS — K219 Gastro-esophageal reflux disease without esophagitis: Secondary | ICD-10-CM | POA: Diagnosis not present

## 2018-09-09 DIAGNOSIS — T7840XS Allergy, unspecified, sequela: Secondary | ICD-10-CM

## 2018-09-09 DIAGNOSIS — Z1231 Encounter for screening mammogram for malignant neoplasm of breast: Secondary | ICD-10-CM | POA: Diagnosis not present

## 2018-09-09 DIAGNOSIS — J301 Allergic rhinitis due to pollen: Secondary | ICD-10-CM | POA: Diagnosis not present

## 2018-09-09 DIAGNOSIS — J4520 Mild intermittent asthma, uncomplicated: Secondary | ICD-10-CM

## 2018-09-09 DIAGNOSIS — J4530 Mild persistent asthma, uncomplicated: Secondary | ICD-10-CM | POA: Diagnosis not present

## 2018-09-09 DIAGNOSIS — H1045 Other chronic allergic conjunctivitis: Secondary | ICD-10-CM | POA: Diagnosis not present

## 2018-09-09 DIAGNOSIS — F3341 Major depressive disorder, recurrent, in partial remission: Secondary | ICD-10-CM

## 2018-09-09 DIAGNOSIS — J3089 Other allergic rhinitis: Secondary | ICD-10-CM | POA: Diagnosis not present

## 2018-09-09 LAB — HM MAMMOGRAPHY

## 2018-09-09 MED ORDER — SHINGRIX 50 MCG/0.5ML IM SUSR: 0.5000 mL | Freq: Once | INTRAMUSCULAR | 0 refills | Status: AC

## 2018-09-09 MED ORDER — PRAVASTATIN SODIUM 40 MG PO TABS: 40.0000 mg | ORAL_TABLET | Freq: Every day | ORAL | 1 refills | Status: DC

## 2018-09-09 MED ORDER — TRAZODONE HCL 50 MG PO TABS: 25.0000 mg | ORAL_TABLET | Freq: Every evening | ORAL | 1 refills | Status: DC | PRN

## 2018-09-09 MED ORDER — DESVENLAFAXINE SUCCINATE ER 50 MG PO TB24: 50.0000 mg | ORAL_TABLET | Freq: Every day | ORAL | 1 refills | Status: DC

## 2018-09-09 MED ORDER — VALACYCLOVIR HCL 500 MG PO TABS: 2000.0000 mg | ORAL_TABLET | Freq: Two times a day (BID) | ORAL | 2 refills | Status: AC

## 2018-09-09 NOTE — Telephone Encounter (Signed)
Noted!   Please schedule pt as stated

## 2018-09-09 NOTE — Telephone Encounter (Signed)
The patient was wanting to schedule a flu shot and labs in September. I didn't see any orders for labs for me to schedule from. I just wanted to make sure if I scheduled the patient for labs that the orders will be there for the lab to know what to draw on the day that I schedule her. I did not schedule any appointments yet until I get more clarification.  Thank you

## 2018-09-09 NOTE — Progress Notes (Addendum)
Erica Marquez DOB: [birth year] Encounter date: 09/09/2018  This isa 68 y.o. female who presents to establish care. Chief Complaint  Patient presents with  . Establish Care    TOC Dr.kim     History of present illness: Gets fever blisters. Uses abreva otc and she isn't sure it helps. Always in same place. Zovirax topical is quite expensive.   HTN: bp had creeped up and she thinks related to weight gain. Just staying at home more, eating more. Working on [employer] and weight loss. Is walking and paddle boarding.   Asthma: breathing has been ok. symbicort will be used daily as well. Usually worst allergy/breathing time is mid fall to feb. singulair as well.   GERD:taking the pepcid for this as needed. Had trouble with phlegm and swallowing. Sometimes hurts - almost like she is getting spasm.   Just restarting the xyzal.  Insomnia: uses trazodone regularly. Does help with sleep.  Hypothyroid: doing well with synthroid.  Mood has been a little hard with isolation, but feels she is doing ok overall. Just more extrovert, so limiting contacts has been difficult.   [id number] with pravastatin. No problems with medication.  Past Medical History:  Diagnosis Date  . Allergy   . Arthritis   . Asthma   . BACK PAIN, LUMBAR, WITH RADICULOPATHY 09/12/2007   Qualifier: Diagnosis of  By: Arnoldo Morale MD, John E   . Blunt head trauma   . Chest pain    s/p extensive nag cardiac eval with cath, stress test and sees Dr. Jens Som  . Depression   . Diverticulosis of colon   . Edema 08/12/2009   Qualifier: Diagnosis of  By: Arnoldo Morale MD, Balinda Quails   . Fibroids   . GERD (gastroesophageal reflux disease)    hiatal hernia  . Hyperlipidemia   . Hypertension   . Hypothyroidism 03/26/2014  . IBS (irritable bowel syndrome)   . Pelvic pain in female, chronic 10/24/2013  . Premature ventricular contractions   . Schatzki's ring    on EGD 2016  . Tubular adenoma of colon 03/2014   Past Surgical History:   Procedure Laterality Date  . BREAST SURGERY     fibroid tumors  . DILATION AND CURETTAGE OF UTERUS    . KNEE CARTILAGE SURGERY Right 03/2015   Torn miniscus   Allergies  Allergen Reactions  . Other     Allergic to hardwoods; thanksgiving to Feb    Current Meds  Medication Sig  . albuterol (PROVENTIL HFA;VENTOLIN HFA) 108 (90 Base) MCG/ACT inhaler Inhale 2 puffs into the lungs every 6 (six) hours as needed.   Marland Kitchen amLODipine (NORVASC) 2.5 MG tablet Take 1 tablet (2.5 mg total) by mouth daily.  Marland Kitchen ammonium lactate (LAC-HYDRIN) 12 % lotion Apply topically daily.  . budesonide-formoterol (SYMBICORT) 160-4.5 MCG/ACT inhaler Inhale 2 puffs into the lungs as needed.   . desvenlafaxine (PRISTIQ) 50 MG 24 hr tablet Take 1 tablet (50 mg total) by mouth daily.  Marland Kitchen levocetirizine (XYZAL) 5 MG tablet Take 5 mg by mouth every evening.  . magnesium oxide (MAG-OX) 400 MG tablet Take 1 tablet (400 mg total) by mouth daily.  . montelukast (SINGULAIR) 10 MG tablet Take 10 mg by mouth at bedtime.  . pravastatin (PRAVACHOL) 40 MG tablet Take 1 tablet (40 mg total) by mouth daily.  . Probiotic Product (ALIGN) 4 MG CAPS Take by mouth.  . SYNTHROID 137 MCG tablet TAKE ONE TABLET BY MOUTH ONE TIME DAILY BEFORE BREAKFAST  . traZODone (  DESYREL) 50 MG tablet Take 0.5 tablets (25 mg total) by mouth at bedtime as needed for sleep.  . [DISCONTINUED] desvenlafaxine (PRISTIQ) 50 MG 24 hr tablet Take 1 tablet (50 mg total) by mouth daily.  . [DISCONTINUED] pravastatin (PRAVACHOL) 40 MG tablet Take 1 tablet (40 mg total) by mouth daily.  . [DISCONTINUED] traZODone (DESYREL) 50 MG tablet Take 0.5 tablets (25 mg total) by mouth at bedtime as needed for sleep.   Social History   Tobacco Use  . Smoking status: Former Smoker    Packs/day: 0.50    Years: 20.00    Pack years: 10.00    Types: Cigarettes    Quit date: 01/20/1988    Years since quitting: 30.6  . Smokeless tobacco: Never Used  . Tobacco comment: quit remotely   Substance Use Topics  . Alcohol use: Yes    Alcohol/week: 0.0 standard drinks    Comment: very rare and a little wine   Family History  Problem Relation Age of Onset  . Coronary artery disease Mother   . Heart disease Mother   . Colon polyps Mother   . Diabetes Mother   . Stomach cancer Father   . Colon cancer Neg Hx   . Kidney disease Neg Hx   . Esophageal cancer Neg Hx   . Gallbladder disease Neg Hx      Review of Systems  Constitutional: Negative for chills, fatigue and fever.  Respiratory: Negative for cough, chest tightness, shortness of breath and wheezing.   Cardiovascular: Negative for chest pain, palpitations and leg swelling.    Objective:  BP 130/80   Pulse (!) 50   Temp (!) 97 F (36.1 C)   Ht 5\' 9"  (1.753 m)   Wt 181 lb (82.1 kg)   SpO2 99%   BMI 26.73 kg/m   Weight: 181 lb (82.1 kg)   BP Readings from Last 3 Encounters:  09/09/18 130/80  03/24/18 122/82  12/23/17 122/74   Wt Readings from Last 3 Encounters:  09/09/18 181 lb (82.1 kg)  03/24/18 180 lb 11.2 oz (82 kg)  12/23/17 179 lb 4.8 oz (81.3 kg)    Physical Exam Constitutional:      General: She is not in acute distress.    Appearance: She is well-developed.  Cardiovascular:     Rate and Rhythm: Normal rate and regular rhythm.     Heart sounds: Normal heart sounds. No murmur. No friction rub.  Pulmonary:     Effort: Pulmonary effort is normal. No respiratory distress.     Breath sounds: Normal breath sounds. No wheezing or rales.  Musculoskeletal:     Right lower leg: No edema.     Left lower leg: No edema.  Neurological:     Mental Status: She is alert and oriented to person, place, and time.  Psychiatric:        Behavior: Behavior normal.     Assessment/Plan:  1. Essential hypertension Continue with Norvasc.  Blood pressure is improved.  Continue to check at home. - Comprehensive metabolic panel; Future - CBC with Differential/Platelet; Future  2. Gastroesophageal reflux  disease, esophagitis presence not specified Encouraged her to take Pepcid daily.  Monitor for swallowing issues.  If not improved in 1 month's time will refer back to GI for scoping.  Let us know any sooner if worsening of symptoms.  We will be able to touch base with her about this when we check in with lab results.  3. Hypothyroidism, unspecified type Continue  on current dose of Synthroid.  Will recheck TSH in the fall. - TSH; Future  4. Hyperlipidemia, unspecified hyperlipidemia type Continue current medication.  Recheck lipid panel - Lipid panel; Future  5. Recurrent major depressive disorder, in partial remission (HCC) Stable.  Continue current dose of Pristiq.  6. Mild intermittent chronic asthma without complication Controlled.  She is going to restart Symbicort per advice of pulmonology.  No use of albuterol inhaler.  7. Allergic state, sequela Is going to take allergy medications regularly.  We discussed that this may contribute to some of "throat sensation" symptoms as well.    Return for bloodwork in sept-oct; CCV 6 months.  Micheline Rough, MD

## 2018-09-09 NOTE — Telephone Encounter (Signed)
Labs are ordered. Ok for AWV with HK in September/october and then if easier HK can set up CCVor physical after that visit.

## 2018-09-09 NOTE — Telephone Encounter (Signed)
Please advise as we were wondering if pt having awv in sep-oct and 6 month f/u for CCV

## 2018-09-09 NOTE — Telephone Encounter (Signed)
The patient would like a call back on her cell number to schedule.

## 2018-09-12 NOTE — Telephone Encounter (Signed)
LMVM for the patient to contact the office to schedule Lab appointment and AWV with Dr. Maudie Mercury

## 2018-10-06 ENCOUNTER — Ambulatory Visit: Payer: Medicare Other | Admitting: Family Medicine

## 2018-10-06 ENCOUNTER — Ambulatory Visit: Payer: Medicare Other

## 2018-10-06 ENCOUNTER — Telehealth: Payer: Medicare Other | Admitting: Family Medicine

## 2018-10-11 ENCOUNTER — Other Ambulatory Visit (INDEPENDENT_AMBULATORY_CARE_PROVIDER_SITE_OTHER): Payer: Medicare Other

## 2018-10-11 ENCOUNTER — Other Ambulatory Visit: Payer: Self-pay

## 2018-10-11 DIAGNOSIS — Z23 Encounter for immunization: Secondary | ICD-10-CM | POA: Diagnosis not present

## 2018-10-11 DIAGNOSIS — E785 Hyperlipidemia, unspecified: Secondary | ICD-10-CM | POA: Diagnosis not present

## 2018-10-11 DIAGNOSIS — I10 Essential (primary) hypertension: Secondary | ICD-10-CM

## 2018-10-11 DIAGNOSIS — E039 Hypothyroidism, unspecified: Secondary | ICD-10-CM

## 2018-10-11 LAB — COMPREHENSIVE METABOLIC PANEL
ALT: 33 U/L (ref 0–35)
AST: 25 U/L (ref 0–37)
Albumin: 4.2 g/dL (ref 3.5–5.2)
Alkaline Phosphatase: 81 U/L (ref 39–117)
BUN: 16 mg/dL (ref 6–23)
CO2: 30 mEq/L (ref 19–32)
Calcium: 10 mg/dL (ref 8.4–10.5)
Chloride: 104 mEq/L (ref 96–112)
Creatinine, Ser: 0.79 mg/dL (ref 0.40–1.20)
GFR: 71.91 mL/min (ref >60.00)
Glucose, Bld: 93 mg/dL (ref 70–99)
Potassium: 4.2 mEq/L (ref 3.5–5.1)
Sodium: 140 mEq/L (ref 135–145)
Total Bilirubin: 0.6 mg/dL (ref 0.2–1.2)
Total Protein: 6.6 g/dL (ref 6.0–8.3)

## 2018-10-11 LAB — LIPID PANEL
Cholesterol: 229 mg/dL — ABNORMAL HIGH (ref 0–200)
HDL: 61 mg/dL (ref >39.00)
LDL Cholesterol: 139 mg/dL — ABNORMAL HIGH (ref 0–99)
NonHDL: 167.87
Total CHOL/HDL Ratio: 4
Triglycerides: 143 mg/dL (ref 0.0–149.0)
VLDL: 28.6 mg/dL (ref 0.0–40.0)

## 2018-10-11 LAB — CBC WITH DIFFERENTIAL/PLATELET
Basophils Absolute: 0 10*3/uL (ref 0.0–0.1)
Basophils Relative: 0.7 % (ref 0.0–3.0)
Eosinophils Absolute: 0.1 10*3/uL (ref 0.0–0.7)
Eosinophils Relative: 1.2 % (ref 0.0–5.0)
HCT: 43.2 % (ref 36.0–46.0)
Hemoglobin: 14.4 g/dL (ref 12.0–15.0)
Lymphocytes Relative: 32.8 % (ref 12.0–46.0)
Lymphs Abs: 1.9 10*3/uL (ref 0.7–4.0)
MCHC: 33.4 g/dL (ref 30.0–36.0)
MCV: 86.3 fl (ref 78.0–100.0)
Monocytes Absolute: 0.5 10*3/uL (ref 0.1–1.0)
Monocytes Relative: 9.2 % (ref 3.0–12.0)
Neutro Abs: 3.2 10*3/uL (ref 1.4–7.7)
Neutrophils Relative %: 56.1 % (ref 43.0–77.0)
Platelets: 213 10*3/uL (ref 150.0–400.0)
RBC: 5.01 Mil/uL (ref 3.87–5.11)
RDW: 14.4 % (ref 11.5–15.5)
WBC: 5.7 10*3/uL (ref 4.0–10.5)

## 2018-10-11 LAB — TSH: TSH: 1.02 u[IU]/mL (ref 0.35–4.50)

## 2018-10-27 ENCOUNTER — Telehealth: Payer: Self-pay

## 2018-10-27 NOTE — Telephone Encounter (Signed)
It would be ok for her to increase the pravastatin to 1.5 tabs daily (this would be 60mg ). If she would like to do this, then ok to send in new rx with this sig.   Repeat ov and labs in 6 months. If she would like to get labs ahead of time, please order: TSH, lipid panel, CMP, CBC

## 2018-10-27 NOTE — Telephone Encounter (Signed)
Pt. Called back and given Dr. Berenice Bouton recommendations from lab work. States she would like to continue on the pravastatin and try dietary changes. Asking if she can increase dose of pravastatin.Also, does pt. Need OV and/or repeat labs in 6 months. Please advise pt.

## 2018-10-28 NOTE — Telephone Encounter (Signed)
Left a message for the pt to return my call.  CRM also created.  

## 2018-11-05 ENCOUNTER — Other Ambulatory Visit: Payer: Self-pay | Admitting: Family Medicine

## 2018-11-08 NOTE — Telephone Encounter (Signed)
Not sure if this is concern, but our system is not recognizing thyroid labs. When refills come through it states that TSH was abnormal and not rechecked, but for example, this patient had normal TSH just last month? Just fyi.

## 2018-11-17 ENCOUNTER — Other Ambulatory Visit: Payer: Self-pay | Admitting: Family Medicine

## 2018-12-01 ENCOUNTER — Telehealth: Payer: Self-pay | Admitting: Family Medicine

## 2018-12-01 MED ORDER — SYNTHROID 137 MCG PO TABS: ORAL_TABLET | ORAL | 1 refills | Status: DC

## 2018-12-01 MED ORDER — DESVENLAFAXINE SUCCINATE ER 50 MG PO TB24: 50.0000 mg | ORAL_TABLET | Freq: Every day | ORAL | 1 refills | Status: AC

## 2018-12-01 NOTE — Telephone Encounter (Signed)
Medication Refill - Medication: desvenlafaxine (PRISTIQ) 50 MG 24 hr tablet  SYNTHROID 137 MCG tablet   Has the patient contacted their pharmacy? Yes.   (Agent: If no, request that the patient contact the pharmacy for the refill.) (Agent: If yes, when and what did the pharmacy advise?)  Preferred Pharmacy (with phone number or street name):  [facility] # [place], [place] - [place]  [address] [place] [address]  Phone: [phone] Fax: [phone]     Agent: Please be advised that RX refills may take up to 3 business days. We ask that you follow-up with your pharmacy.

## 2018-12-01 NOTE — Telephone Encounter (Signed)
Rx done. 

## 2018-12-05 ENCOUNTER — Encounter: Payer: Self-pay | Admitting: Family Medicine

## 2018-12-05 DIAGNOSIS — E785 Hyperlipidemia, unspecified: Secondary | ICD-10-CM

## 2018-12-06 MED ORDER — ATORVASTATIN CALCIUM 20 MG PO TABS: 20.0000 mg | ORAL_TABLET | Freq: Every day | ORAL | 1 refills | Status: DC

## 2018-12-12 ENCOUNTER — Other Ambulatory Visit: Payer: Self-pay

## 2018-12-13 ENCOUNTER — Encounter: Payer: Self-pay | Admitting: Family Medicine

## 2019-02-17 DIAGNOSIS — Z23 Encounter for immunization: Secondary | ICD-10-CM | POA: Diagnosis not present

## 2019-02-21 ENCOUNTER — Telehealth: Payer: Self-pay | Admitting: Family Medicine

## 2019-02-21 DIAGNOSIS — L853 Xerosis cutis: Secondary | ICD-10-CM | POA: Diagnosis not present

## 2019-02-21 DIAGNOSIS — L814 Other melanin hyperpigmentation: Secondary | ICD-10-CM | POA: Diagnosis not present

## 2019-02-21 DIAGNOSIS — L570 Actinic keratosis: Secondary | ICD-10-CM | POA: Diagnosis not present

## 2019-02-21 DIAGNOSIS — L298 Other pruritus: Secondary | ICD-10-CM | POA: Diagnosis not present

## 2019-02-21 DIAGNOSIS — D2272 Melanocytic nevi of left lower limb, including hip: Secondary | ICD-10-CM | POA: Diagnosis not present

## 2019-02-21 DIAGNOSIS — Z7189 Other specified counseling: Secondary | ICD-10-CM | POA: Diagnosis not present

## 2019-02-21 DIAGNOSIS — B079 Viral wart, unspecified: Secondary | ICD-10-CM | POA: Diagnosis not present

## 2019-02-21 DIAGNOSIS — L820 Inflamed seborrheic keratosis: Secondary | ICD-10-CM | POA: Diagnosis not present

## 2019-02-21 DIAGNOSIS — X32XXXA Exposure to sunlight, initial encounter: Secondary | ICD-10-CM | POA: Diagnosis not present

## 2019-02-21 DIAGNOSIS — L578 Other skin changes due to chronic exposure to nonionizing radiation: Secondary | ICD-10-CM | POA: Diagnosis not present

## 2019-02-21 DIAGNOSIS — D2372 Other benign neoplasm of skin of left lower limb, including hip: Secondary | ICD-10-CM | POA: Diagnosis not present

## 2019-02-21 DIAGNOSIS — Z789 Other specified health status: Secondary | ICD-10-CM | POA: Diagnosis not present

## 2019-02-21 DIAGNOSIS — L538 Other specified erythematous conditions: Secondary | ICD-10-CM | POA: Diagnosis not present

## 2019-02-21 DIAGNOSIS — D23112 Other benign neoplasm of skin of right lower eyelid, including canthus: Secondary | ICD-10-CM | POA: Diagnosis not present

## 2019-02-21 DIAGNOSIS — D485 Neoplasm of uncertain behavior of skin: Secondary | ICD-10-CM | POA: Diagnosis not present

## 2019-02-21 NOTE — Telephone Encounter (Signed)
Pt received a call stating she needed to schedule her Medicare Wellness check and cholesterol. I have scheduled the pt's wellness check but both the pt and I did not know if she needed a seperate appt for her cholesterol or if it will be the same appt. Pt can be reached at [phone]

## 2019-02-21 NOTE — Telephone Encounter (Signed)
I called the pt and scheduled a lab appt for 2/11 at arrive at 10:10 for labs.  Patient complains of a dull headache x2 weeks, states she wakes up with this and questions if this could be due to elevated BP?  Stated her BP has been [id number] diastolic.  Started Norvasc 3 months ago and wanted to know if she should increase the dose from 2.5mg  daily?  Message sent to PCP.

## 2019-02-22 ENCOUNTER — Other Ambulatory Visit: Payer: Self-pay | Admitting: Family Medicine

## 2019-02-22 MED ORDER — AMLODIPINE BESYLATE 5 MG PO TABS: 5.0000 mg | ORAL_TABLET | Freq: Every day | ORAL | 1 refills | Status: DC

## 2019-02-22 NOTE — Telephone Encounter (Signed)
Patient informed of the message below.  Appt scheduled for 3/5 as the pt wanted to check her BP for a few weeks to see how she does.  Message sent to PCP as the pt requests a refill on Amlodipine since the dose was increased.

## 2019-02-22 NOTE — Telephone Encounter (Signed)
Noted  

## 2019-02-22 NOTE — Telephone Encounter (Signed)
Yes; increase to 5mg  daily (2 tablets) and let me know if any worsening of headache. Keep monitoring blood pressure and let me know if not improving with increased dose. Make sure she has f/u schedule with me as well so we can touch base on this (after labs is fine)

## 2019-02-22 NOTE — Telephone Encounter (Signed)
Refill sent to [facility]

## 2019-02-28 DIAGNOSIS — H5203 Hypermetropia, bilateral: Secondary | ICD-10-CM | POA: Diagnosis not present

## 2019-02-28 DIAGNOSIS — H25013 Cortical age-related cataract, bilateral: Secondary | ICD-10-CM | POA: Diagnosis not present

## 2019-02-28 DIAGNOSIS — H353111 Nonexudative age-related macular degeneration, right eye, early dry stage: Secondary | ICD-10-CM | POA: Diagnosis not present

## 2019-03-01 ENCOUNTER — Other Ambulatory Visit: Payer: Self-pay

## 2019-03-02 ENCOUNTER — Other Ambulatory Visit (INDEPENDENT_AMBULATORY_CARE_PROVIDER_SITE_OTHER): Payer: Medicare Other

## 2019-03-02 ENCOUNTER — Ambulatory Visit (INDEPENDENT_AMBULATORY_CARE_PROVIDER_SITE_OTHER): Payer: Medicare Other

## 2019-03-02 ENCOUNTER — Other Ambulatory Visit: Payer: Self-pay | Admitting: Family Medicine

## 2019-03-02 ENCOUNTER — Encounter: Payer: Self-pay | Admitting: Gastroenterology

## 2019-03-02 DIAGNOSIS — E785 Hyperlipidemia, unspecified: Secondary | ICD-10-CM

## 2019-03-02 DIAGNOSIS — Z1211 Encounter for screening for malignant neoplasm of colon: Secondary | ICD-10-CM

## 2019-03-02 DIAGNOSIS — Z0000 Encounter for general adult medical examination without abnormal findings: Secondary | ICD-10-CM | POA: Diagnosis not present

## 2019-03-02 LAB — LIPID PANEL
Cholesterol: 175 mg/dL (ref 0–200)
HDL: 61.8 mg/dL (ref >39.00)
LDL Cholesterol: 93 mg/dL (ref 0–99)
NonHDL: 113.5
Total CHOL/HDL Ratio: 3
Triglycerides: 105 mg/dL (ref 0.0–149.0)
VLDL: 21 mg/dL (ref 0.0–40.0)

## 2019-03-02 MED ORDER — TRAZODONE HCL 50 MG PO TABS: 50.0000 mg | ORAL_TABLET | Freq: Every evening | ORAL | 1 refills | Status: AC | PRN

## 2019-03-02 NOTE — Progress Notes (Signed)
Subjective:   Erica Marquez is a 69 y.o. female who presents for Medicare Annual (Subsequent) preventive examination.  She is doing very well at this time. She is walking about 3 days a week for 60 minutes or longer each time. She will return to the [facility] when the pandemic situation improves. She states that she is eating more since she is home more and has gained extra weight. She is having problems sleeping as well. Erica Marquez has received first covid vaccine.   Review of Systems:  No ROS; Annual Medicare Wellness Visit Cardiac Risk Factors include: advanced age (>36men, >44 women);hypertension;dyslipidemia     Objective:     Vitals: BP 140/64 (BP Location: Left Arm, Patient Position: Sitting)   Temp 98.4 F (36.9 C) (Temporal)   Ht 5\' 9"  (1.753 m)   Wt 182 lb 3.2 oz (82.6 kg)   BMI 26.91 kg/m   Body mass index is 26.91 kg/m.  Advanced Directives 03/02/2019 10/05/2017 07/31/2016 01/25/2014  Does Patient Have a Medical Advance Directive? Yes Yes Yes Yes  Type of Paramedic of [place];Living will - [place];Living will Living will;Healthcare Power of Attorney  Does patient want to make changes to medical advance directive? No - Patient declined - No - Patient declined No - Patient declined  Copy of [place] in Chart? No - copy requested - No - copy requested No - copy requested    Tobacco Social History   Tobacco Use  Smoking Status Former Smoker  . Packs/day: 0.50  . Years: 20.00  . Pack years: 10.00  . Types: Cigarettes  . Quit date: 01/20/1988  . Years since quitting: 31.1  Smokeless Tobacco Never Used  Tobacco Comment   quit remotely     Counseling given: Not Answered Comment: quit remotely   Clinical Intake:     Pain : No/denies pain     BMI - recorded: 26.91 Nutritional Status: BMI 25 -29 Overweight Nutritional Risks: None Diabetes: No  How often do you need to have someone help you when  you read instructions, pamphlets, or other written materials from your doctor or pharmacy?: 1 - Never  Interpreter Needed?: No  Information entered by :: Franne Forts, LPN.  Past Medical History:  Diagnosis Date  . Allergy   . Arthritis   . Asthma   . BACK PAIN, LUMBAR, WITH RADICULOPATHY 09/12/2007   Qualifier: Diagnosis of  By: Arnoldo Morale MD, John E   . Blunt head trauma   . Chest pain    s/p extensive nag cardiac eval with cath, stress test and sees Dr. Jens Som  . Depression   . Diverticulosis of colon   . Edema 08/12/2009   Qualifier: Diagnosis of  By: Arnoldo Morale MD, Balinda Quails   . Fibroids   . GERD (gastroesophageal reflux disease)    hiatal hernia  . Hyperlipidemia   . Hypertension   . Hypothyroidism 03/26/2014  . IBS (irritable bowel syndrome)   . Pelvic pain in female, chronic 10/24/2013  . Premature ventricular contractions   . Schatzki's ring    on EGD 2016  . Tubular adenoma of colon 03/2014   Past Surgical History:  Procedure Laterality Date  . BREAST SURGERY     fibroid tumors  . DILATION AND CURETTAGE OF UTERUS    . KNEE CARTILAGE SURGERY Right 03/2015   Torn miniscus   Family History  Problem Relation Age of Onset  . Coronary artery disease Mother   .  Heart disease Mother   . Colon polyps Mother   . Diabetes Mother   . Stomach cancer Father   . Colon cancer Neg Hx   . Kidney disease Neg Hx   . Esophageal cancer Neg Hx   . Gallbladder disease Neg Hx    Social History   Socioeconomic History  . Marital status: Married    Spouse name: Not on file  . Number of children: 2  . Years of education: Not on file  . Highest education level: Not on file  Occupational History  . Occupation: Housewife  Tobacco Use  . Smoking status: Former Smoker    Packs/day: 0.50    Years: 20.00    Pack years: 10.00    Types: Cigarettes    Quit date: 01/20/1988    Years since quitting: 31.1  . Smokeless tobacco: Never Used  . Tobacco comment: quit remotely  Substance and  Sexual Activity  . Alcohol use: Yes    Alcohol/week: 0.0 standard drinks    Comment: very rare and a little wine  . Drug use: No  . Sexual activity: Yes  Other Topics Concern  . Not on file  Social History Narrative   Work or School: retired - used to work in [place] Situation: lives with husband      Spiritual Beliefs: Christian      Lifestyle: walks and went to the gym prior to pandemic - diet is healthy      2 children + grandchildren         Social Determinants of Health   Financial Resource Strain: Low Risk   . Difficulty of Paying Living Expenses: Not hard at all  Food Insecurity: No Food Insecurity  . Worried About Charity fundraiser in the Last Year: Never true  . Ran Out of Food in the Last Year: Never true  Transportation Needs: No Transportation Needs  . Lack of Transportation (Medical): No  . Lack of Transportation (Non-Medical): No  Physical Activity: Sufficiently Active  . Days of Exercise per Week: 3 days  . Minutes of Exercise per Session: 60 min  Stress: No Stress Concern Present  . Feeling of Stress : Only a little  Social Connections: Not Isolated  . Frequency of Communication with Friends and Family: More than three times a week  . Frequency of Social Gatherings with Friends and Family: Twice a week  . Attends Religious Services: More than 4 times per year  . Active Member of Clubs or Organizations: Yes  . Attends Archivist Meetings: More than 4 times per year  . Marital Status: Married    Outpatient Encounter Medications as of 03/02/2019  Medication Sig  . albuterol (PROVENTIL HFA;VENTOLIN HFA) 108 (90 Base) MCG/ACT inhaler Inhale 2 puffs into the lungs every 6 (six) hours as needed.   Marland Kitchen amLODipine (NORVASC) 5 MG tablet Take 1 tablet (5 mg total) by mouth daily.  Marland Kitchen ammonium lactate (LAC-HYDRIN) 12 % lotion Apply topically daily.  Marland Kitchen atorvastatin (LIPITOR) 20 MG tablet Take 1 tablet (20 mg total) by mouth daily.  .  budesonide-formoterol (SYMBICORT) 160-4.5 MCG/ACT inhaler Inhale 2 puffs into the lungs as needed.   . desvenlafaxine (PRISTIQ) 50 MG 24 hr tablet Take 1 tablet (50 mg total) by mouth daily.  Marland Kitchen levocetirizine (XYZAL) 5 MG tablet Take 5 mg by mouth every evening.  . magnesium oxide (MAG-OX) 400 MG tablet Take 1 tablet (400 mg total) by mouth  daily.  . montelukast (SINGULAIR) 10 MG tablet Take 10 mg by mouth at bedtime.  Marland Kitchen omeprazole (PRILOSEC) 20 MG capsule omeprazole 20 mg capsule,delayed release  . Probiotic Product (ALIGN) 4 MG CAPS Take by mouth.  . SYNTHROID 137 MCG tablet Take 1 tablet by mouth daily before breakfast  . valACYclovir (VALTREX) 500 MG tablet Take 4 tablets (2,000 mg total) by mouth 2 (two) times daily. Take at onset of symtpoms for 1 day.  . [DISCONTINUED] traZODone (DESYREL) 50 MG tablet Take 0.5 tablets (25 mg total) by mouth at bedtime as needed for sleep.  . [DISCONTINUED] Amlodipine-Valsartan-HCTZ 5-160-12.5 MG TABS Take 1 tablet by mouth daily.  . [DISCONTINUED] Rivaroxaban 20 MG TABS Take 20 mg by mouth daily.   No facility-administered encounter medications on file as of 03/02/2019.    Activities of Daily Living In your present state of health, do you have any difficulty performing the following activities: 03/02/2019  Hearing? N  Vision? N  Difficulty concentrating or making decisions? N  Walking or climbing stairs? N  Dressing or bathing? N  Doing errands, shopping? N  Preparing Food and eating ? N  Using the Toilet? N  In the past six months, have you accidently leaked urine? N  Do you have problems with loss of bowel control? N  Managing your Medications? N  Managing your Finances? N  Housekeeping or managing your Housekeeping? N  Some recent data might be hidden    Patient Care Team: Caren Macadam, MD as PCP - General (Family Medicine) Deboraha Sprang, MD as PCP - Cardiology (Cardiology)    Assessment:   This is a routine wellness  examination for [place].  Exercise Activities and Dietary recommendations Current Exercise Habits: Home exercise routine, Type of exercise: walking, Time (Minutes): 60, Frequency (Times/Week): 3, Weekly Exercise (Minutes/Week): 180, Intensity: Moderate, Exercise limited by: None identified  Goals    . Increase physical activity     When pandemic situation improves. This will help with physical and mental health.        Fall Risk Fall Risk  03/02/2019 12/12/2018 10/05/2017 07/31/2016 12/27/2015  Falls in the past year? 0 0 Yes No No  Comment - Emmi Telephone Survey: data to providers prior to load - - Emmi Telephone Survey: data to providers prior to load  Number falls in past yr: - - 1 - -  Follow up Falls evaluation completed;Education provided;Falls prevention discussed - - - -   Is the patient's home free of loose throw rugs in walkways, pet beds, electrical cords, etc?   yes      Grab bars in the bathroom? yes      Handrails on the stairs?   yes      Adequate lighting?   yes  Timed Get Up and Go performed: normal  Depression Screen PHQ 2/9 Scores 03/02/2019 03/02/2019 05/19/2018 03/24/2018  PHQ - 2 Score 0 0 0 0  PHQ- 9 Score - - 2 2     Cognitive Function MMSE - Mini Mental State Exam 10/05/2017  Not completed: (No Data)     6CIT Screen 03/02/2019  What Year? 0 points  What month? 0 points  What time? 0 points  Count back from 20 0 points  Months in reverse 0 points  Repeat phrase 0 points  Total Score 0    Immunization History  Administered Date(s) Administered  . Fluad Quad(high Dose 65+) 10/11/2018  . Influenza Split 10/10/2010  . Influenza Whole 10/11/2009  .  Influenza, High Dose Seasonal PF 12/04/2015, 11/02/2016, 10/28/2017  . Influenza,inj,Quad PF,6+ Mos 10/24/2012, 10/24/2013  . Influenza-Unspecified 08/19/2013, 11/23/2014, 09/20/2018  . Moderna SARS-COVID-2 Vaccination 02/17/2019  . Pneumococcal Conjugate-13 10/24/2013  . Pneumococcal Polysaccharide-23  11/02/2016  . Td 11/21/2003  . Tdap 01/25/2014    Qualifies for Shingles Vaccine?yes  Screening Tests Health Maintenance  Topic Date Due  . COLONOSCOPY  03/22/2019  . MAMMOGRAM  09/09/2019  . TETANUS/TDAP  01/26/2024  . INFLUENZA VACCINE  Completed  . DEXA SCAN  Completed  . Hepatitis C Screening  Completed  . PNA vac Low Risk Adult  Completed    Cancer Screenings: Lung: Low Dose CT Chest recommended if Age 34-80 years, 30 pack-year currently smoking OR have quit w/in 15years. Patient does not qualify. Breast:  Up to date on Mammogram? Yes   Up to date of Bone Density/Dexa? Yes Colorectal: yes  Additional Screenings:  Hepatitis C Screening: completed 01/24/2016.    Plan:     Mrs. Rennard will inquire about her out of pocket costs for shingrix vaccines after she completes the second covid vaccine.  Referral was sent to [facility] today for Dr. Fuller Plan. Colonoscopy is due again after 03/22/2019. She is up to date with all other preventive health screenings and immunizations.   I have personally reviewed and noted the following in the patient's chart:   . Medical and social history . Use of alcohol, tobacco or illicit drugs  . Current medications and supplements . Functional ability and status . Nutritional status . Physical activity . Advanced directives . List of other physicians . Hospitalizations, surgeries, and ER visits in previous 12 months . Vitals . Screenings to include cognitive, depression, and falls . Referrals and appointments  In addition, I have reviewed and discussed with patient certain preventive protocols, quality metrics, and best practice recommendations. A written personalized care plan for preventive services as well as general preventive health recommendations were provided to patient.     Franne Forts, LPN  [id number]

## 2019-03-02 NOTE — Patient Instructions (Addendum)
Erica Marquez , Thank you for taking time to come for your Medicare Wellness Visit. I appreciate your ongoing commitment to your health goals. Please review the following plan we discussed and let me know if I can assist you in the future.   Screening recommendations/referrals: Colorectal Screening: colonoscopy completed 03/22/2014; due 03/23/2019. Referral sent to [facility] with Dr. Fuller Plan to be scheduled late March 2021 per your request.  Mammogram: completed 09/09/2018; due 09/10/2019.  Bone Density: completed 10/23/2016; up to date. Normal results.  Vision and Dental Exams: Recommended annual ophthalmology exams for early detection of glaucoma and other disorders of the eye. Eye exam completed 02/28/2019.  Recommended annual dental exams for proper oral hygiene. Patient saw dentist 3 months ago.   Diabetic Exams: Diabetic Eye Exam: N/A Diabetic Foot Exam: N/A  Vaccinations: Influenza vaccine: completed 10/11/2018; due again in Fall 2021.  Pneumococcal vaccine: completed 10/24/2013 & 11/02/2016. Up to date.  Tdap vaccine: completed 01/25/2014; due 01/26/2024.  Shingles vaccine: Please call your pharmacy or insurance company to determine your out of pocket expense for the Shingrix vaccine. You may receive this vaccine at your local pharmacy.  Advanced directives: Advance directives discussed with you today. Please bring a copy of your POA (Power of [place]) and/or Living Will to your next appointment.  Goals: Recommend to drink at least 6-8 8oz glasses of water per day.  Recommend to exercise for at least 150 minutes per week.  Recommend to remove any items from the home that may cause slips or trips.  Recommend to decrease portion sizes by eating 3 small healthy meals and at least 2 healthy snacks per day.  Next appointment: Please schedule your Annual Wellness Visit with your Nurse Health Advisor in one year.  Preventive Care 69 Years and Older, Female Preventive care  refers to lifestyle choices and visits with your health care provider that can promote health and wellness. What does preventive care include?  A yearly physical exam. This is also called an annual well check.  Dental exams once or twice a year.  Routine eye exams. Ask your health care provider how often you should have your eyes checked.  Personal lifestyle choices, including:  Daily care of your teeth and gums.  Regular physical activity.  Eating a healthy diet.  Avoiding tobacco and drug use.  Limiting alcohol use.  Practicing safe sex.  Taking low-dose aspirin every day if recommended by your health care provider.  Taking vitamin and mineral supplements as recommended by your health care provider. What happens during an annual well check? The services and screenings done by your health care provider during your annual well check will depend on your age, overall health, lifestyle risk factors, and family history of disease. Counseling  Your health care provider may ask you questions about your:  Alcohol use.  Tobacco use.  Drug use.  Emotional well-being.  Home and relationship well-being.  Sexual activity.  Eating habits.  History of falls.  Memory and ability to understand (cognition).  Work and work Statistician.  Reproductive health. Screening  You may have the following tests or measurements:  Height, weight, and BMI.  Blood pressure.  Lipid and cholesterol levels. These may be checked every 5 years, or more frequently if you are over 18 years old.  Skin check.  Lung cancer screening. You may have this screening every year starting at age 77 if you have a 30-pack-year history of smoking and currently smoke or have quit within the past 15  years.  Fecal occult blood test (FOBT) of the stool. You may have this test every year starting at age 14.  Flexible sigmoidoscopy or colonoscopy. You may have a sigmoidoscopy every 5 years or a colonoscopy  every 10 years starting at age 71.  Hepatitis C blood test.  Hepatitis B blood test.  Sexually transmitted disease (STD) testing.  Diabetes screening. This is done by checking your blood sugar (glucose) after you have not eaten for a while (fasting). You may have this done every 1-3 years.  Bone density scan. This is done to screen for osteoporosis. You may have this done starting at age 23.  Mammogram. This may be done every 1-2 years. Talk to your health care provider about how often you should have regular mammograms. Talk with your health care provider about your test results, treatment options, and if necessary, the need for more tests. Vaccines  Your health care provider may recommend certain vaccines, such as:  Influenza vaccine. This is recommended every year.  Tetanus, diphtheria, and acellular pertussis (Tdap, Td) vaccine. You may need a Td booster every 10 years.  Zoster vaccine. You may need this after age 59.  Pneumococcal 13-valent conjugate (PCV13) vaccine. One dose is recommended after age 65.  Pneumococcal polysaccharide (PPSV23) vaccine. One dose is recommended after age 40. Talk to your health care provider about which screenings and vaccines you need and how often you need them. This information is not intended to replace advice given to you by your health care provider. Make sure you discuss any questions you have with your health care provider. Document Released: 02/01/2015 Document Revised: 09/25/2015 Document Reviewed: 11/06/2014 Elsevier Interactive Patient Education  2017 [place] Prevention in the Home Falls can cause injuries. They can happen to people of all ages. There are many things you can do to make your home safe and to help prevent falls. What can I do on the outside of my home?  Regularly fix the edges of walkways and driveways and fix any cracks.  Remove anything that might make you trip as you walk through a door, such as a  raised step or threshold.  Trim any bushes or trees on the path to your home.  Use bright outdoor lighting.  Clear any walking paths of anything that might make someone trip, such as rocks or tools.  Regularly check to see if handrails are loose or broken. Make sure that both sides of any steps have handrails.  Any raised decks and porches should have guardrails on the edges.  Have any leaves, snow, or ice cleared regularly.  Use sand or salt on walking paths during winter.  Clean up any spills in your garage right away. This includes oil or grease spills. What can I do in the bathroom?  Use night lights.  Install grab bars by the toilet and in the tub and shower. Do not use towel bars as grab bars.  Use non-skid mats or decals in the tub or shower.  If you need to sit down in the shower, use a plastic, non-slip stool.  Keep the floor dry. Clean up any water that spills on the floor as soon as it happens.  Remove soap buildup in the tub or shower regularly.  Attach bath mats securely with double-sided non-slip rug tape.  Do not have throw rugs and other things on the floor that can make you trip. What can I do in the bedroom?  Use night lights.  Make  sure that you have a light by your bed that is easy to reach.  Do not use any sheets or blankets that are too big for your bed. They should not hang down onto the floor.  Have a firm chair that has side arms. You can use this for support while you get dressed.  Do not have throw rugs and other things on the floor that can make you trip. What can I do in the kitchen?  Clean up any spills right away.  Avoid walking on wet floors.  Keep items that you use a lot in easy-to-reach places.  If you need to reach something above you, use a strong step stool that has a grab bar.  Keep electrical cords out of the way.  Do not use floor polish or wax that makes floors slippery. If you must use wax, use non-skid floor wax.   Do not have throw rugs and other things on the floor that can make you trip. What can I do with my stairs?  Do not leave any items on the stairs.  Make sure that there are handrails on both sides of the stairs and use them. Fix handrails that are broken or loose. Make sure that handrails are as long as the stairways.  Check any carpeting to make sure that it is firmly attached to the stairs. Fix any carpet that is loose or worn.  Avoid having throw rugs at the top or bottom of the stairs. If you do have throw rugs, attach them to the floor with carpet tape.  Make sure that you have a light switch at the top of the stairs and the bottom of the stairs. If you do not have them, ask someone to add them for you. What else can I do to help prevent falls?  Wear shoes that:  Do not have high heels.  Have rubber bottoms.  Are comfortable and fit you well.  Are closed at the toe. Do not wear sandals.  If you use a stepladder:  Make sure that it is fully opened. Do not climb a closed stepladder.  Make sure that both sides of the stepladder are locked into place.  Ask someone to hold it for you, if possible.  Clearly mark and make sure that you can see:  Any grab bars or handrails.  First and last steps.  Where the edge of each step is.  Use tools that help you move around (mobility aids) if they are needed. These include:  Canes.  Walkers.  Scooters.  Crutches.  Turn on the lights when you go into a dark area. Replace any light bulbs as soon as they burn out.  Set up your furniture so you have a clear path. Avoid moving your furniture around.  If any of your floors are uneven, fix them.  If there are any pets around you, be aware of where they are.  Review your medicines with your doctor. Some medicines can make you feel dizzy. This can increase your chance of falling. Ask your doctor what other things that you can do to help prevent falls. This information is not  intended to replace advice given to you by your health care provider. Make sure you discuss any questions you have with your health care provider. Document Released: 11/01/2008 Document Revised: 06/13/2015 Document Reviewed: 02/09/2014 Elsevier Interactive Patient Education  2017 [employer].

## 2019-03-17 DIAGNOSIS — Z23 Encounter for immunization: Secondary | ICD-10-CM | POA: Diagnosis not present

## 2019-03-23 ENCOUNTER — Other Ambulatory Visit: Payer: Self-pay

## 2019-03-23 NOTE — Progress Notes (Addendum)
Erica Marquez DOB: 11/18/1950 Encounter date: 03/24/2019  This is a 69 y.o. female who presents with Chief Complaint  Patient presents with  . Follow-up    History of present illness: Amlodipine increased to 5 mg daily on February 2.  Patient was complaining of a dull headache.  Blood pressure still running somewhat elevated at 148/82.  Still having dull headache. Has seen ophtho and everything was good at that visit.   Every once in awhile gets queesy with dull headache. Almost feels motion sick. Not dizzy, no room spinning. Usually lasts a day and a half. Lasts whole day and a half. She is doing well staying hydrated. Comes once every couple of months. Sometimes gets warm. Does have some acid reflux. She is on third pack of prilosec - she is going on 18 weeks of medication. No abd pain. Sometimes when swallow doesn't feel like it wants to easily go down. Ifnot taking omeprazole then getting a lot of burning.   Lipitor 20 mg was started in November, and cholesterol has improved significantly. Tolerating this well.   Lipid Panel     Component Value Date/Time   CHOL 175 03/02/2019 1021   TRIG 105.0 03/02/2019 1021   HDL 61.80 03/02/2019 1021   CHOLHDL 3 03/02/2019 1021   VLDL 21.0 03/02/2019 1021   LDLCALC 93 03/02/2019 1021   LDLDIRECT 198.8 12/03/2011 0845    Allergies  Allergen Reactions  . Other     Allergic to hardwoods; thanksgiving to Feb    Current Meds  Medication Sig  . amLODipine (NORVASC) 5 MG tablet Take 1 tablet (5 mg total) by mouth daily.  Marland Kitchen ammonium lactate (LAC-HYDRIN) 12 % lotion Apply topically daily.  Marland Kitchen atorvastatin (LIPITOR) 20 MG tablet Take 1 tablet (20 mg total) by mouth daily.  . budesonide-formoterol (SYMBICORT) 160-4.5 MCG/ACT inhaler Inhale 2 puffs into the lungs as needed.   . desvenlafaxine (PRISTIQ) 50 MG 24 hr tablet Take 1 tablet (50 mg total) by mouth daily.  Marland Kitchen levocetirizine (XYZAL) 5 MG tablet Take 5 mg by mouth every evening.  .  magnesium oxide (MAG-OX) 400 MG tablet Take 1 tablet (400 mg total) by mouth daily.  . montelukast (SINGULAIR) 10 MG tablet Take 10 mg by mouth at bedtime.  Marland Kitchen omeprazole (PRILOSEC) 20 MG capsule omeprazole 20 mg capsule,delayed release  . Probiotic Product (ALIGN) 4 MG CAPS Take by mouth.  . SYNTHROID 137 MCG tablet Take 1 tablet by mouth daily before breakfast  . traZODone (DESYREL) 50 MG tablet Take 1 tablet (50 mg total) by mouth at bedtime as needed for sleep.    Review of Systems  Constitutional: Negative for chills, fatigue and fever.  Respiratory: Negative for cough, chest tightness, shortness of breath and wheezing.   Cardiovascular: Negative for chest pain, palpitations and leg swelling.  Gastrointestinal: Negative for abdominal pain. Constipation: align helps some.        Some difficulty with swallowing with food and sometimes with liquid; sometimes feels like spasm  Neurological: Positive for headaches.    Objective:  BP (!) 142/70 (BP Location: Left Arm, Patient Position: Sitting, Cuff Size: Large)   Pulse (!) 59   Temp (!) 97.3 F (36.3 C) (Temporal)   Ht 5\' 9"  (1.753 m)   Wt 187 lb 4.8 oz (85 kg)   SpO2 97%   BMI 27.66 kg/m   Weight: 187 lb 4.8 oz (85 kg)  Initial bp was in 120's; recheck in 140's. BP Readings from Last 3  Encounters:  03/24/19 (!) 142/70  03/02/19 140/64  09/09/18 130/80   Wt Readings from Last 3 Encounters:  03/24/19 187 lb 4.8 oz (85 kg)  03/02/19 182 lb 3.2 oz (82.6 kg)  09/09/18 181 lb (82.1 kg)    Physical Exam Constitutional:      General: She is not in acute distress.    Appearance: She is well-developed.  Cardiovascular:     Rate and Rhythm: Normal rate and regular rhythm.     Heart sounds: Normal heart sounds. No murmur. No friction rub.  Pulmonary:     Effort: Pulmonary effort is normal. No respiratory distress.     Breath sounds: Normal breath sounds. No wheezing or rales.  Musculoskeletal:     Right lower leg: No edema.      Left lower leg: No edema.  Neurological:     Mental Status: She is alert and oriented to person, place, and time.  Psychiatric:        Behavior: Behavior normal.     Assessment/Plan  1. Essential hypertension Still not ideally well controlled. Increase amlodipine to 7.5mg  daily. We are monitoring diastolic since this tends to be better controlled although I suspect she will need 10mg  for systolic control. Consider hctz if swelling occurs. We discussed this today.  2. Hyperlipidemia, unspecified hyperlipidemia type Significantly improved with lipitor. Continue this medication.  3. Gastroesophageal reflux disease, unspecified whether esophagitis present Has follow up today with GI and upcoming colonoscopy. Encouraged patient to talk with them about reflux symptoms. Hx of gastritis and schatzki ring on EGD; may need repeat EGD for assessment. Message sent to Dr. Fuller Plan as heads up.  4. Dysphagia, unspecified type See above. Continue with omeprazole for time being.   Return for pending mychart update.     Micheline Rough, MD

## 2019-03-24 ENCOUNTER — Telehealth (INDEPENDENT_AMBULATORY_CARE_PROVIDER_SITE_OTHER): Payer: Medicare Other | Admitting: Family Medicine

## 2019-03-24 ENCOUNTER — Encounter: Payer: Self-pay | Admitting: Family Medicine

## 2019-03-24 VITALS — BP 142/70 | HR 59 | Temp 97.3°F | Ht 69.0 in | Wt 187.3 lb

## 2019-03-24 DIAGNOSIS — R1310 Dysphagia, unspecified: Secondary | ICD-10-CM

## 2019-03-24 DIAGNOSIS — K219 Gastro-esophageal reflux disease without esophagitis: Secondary | ICD-10-CM

## 2019-03-24 DIAGNOSIS — E785 Hyperlipidemia, unspecified: Secondary | ICD-10-CM

## 2019-03-24 DIAGNOSIS — I10 Essential (primary) hypertension: Secondary | ICD-10-CM | POA: Diagnosis not present

## 2019-03-24 NOTE — Patient Instructions (Addendum)
Increase amlodipine to 1.5 tabs daily (7.5mg ) for next 1-2 weeks. Update me after this to let me know how pressures running. If at 1 weeks time your systolic pressure is usually over 140; increase to 2 tablets (10mg ). Let me know if any side effects.

## 2019-03-31 ENCOUNTER — Encounter: Payer: Self-pay | Admitting: Family Medicine

## 2019-04-11 DIAGNOSIS — K219 Gastro-esophageal reflux disease without esophagitis: Secondary | ICD-10-CM | POA: Diagnosis not present

## 2019-04-11 DIAGNOSIS — R1310 Dysphagia, unspecified: Secondary | ICD-10-CM | POA: Diagnosis not present

## 2019-04-11 DIAGNOSIS — Z86010 Personal history of colonic polyps: Secondary | ICD-10-CM | POA: Diagnosis not present

## 2019-04-12 ENCOUNTER — Encounter: Payer: Medicare Other | Admitting: Gastroenterology

## 2019-04-13 ENCOUNTER — Encounter: Payer: Self-pay | Admitting: Family Medicine

## 2019-04-14 ENCOUNTER — Other Ambulatory Visit: Payer: Self-pay | Admitting: Family Medicine

## 2019-04-14 MED ORDER — AMLODIPINE BESYLATE 2.5 MG PO TABS: 7.5000 mg | ORAL_TABLET | Freq: Every day | ORAL | 1 refills | Status: DC

## 2019-04-17 DIAGNOSIS — J3089 Other allergic rhinitis: Secondary | ICD-10-CM | POA: Diagnosis not present

## 2019-04-17 DIAGNOSIS — J301 Allergic rhinitis due to pollen: Secondary | ICD-10-CM | POA: Diagnosis not present

## 2019-04-17 DIAGNOSIS — J4530 Mild persistent asthma, uncomplicated: Secondary | ICD-10-CM | POA: Diagnosis not present

## 2019-04-17 DIAGNOSIS — H1045 Other chronic allergic conjunctivitis: Secondary | ICD-10-CM | POA: Diagnosis not present

## 2019-04-26 ENCOUNTER — Encounter: Payer: Self-pay | Admitting: Family Medicine

## 2019-05-07 ENCOUNTER — Encounter: Payer: Self-pay | Admitting: Family Medicine

## 2019-05-23 ENCOUNTER — Other Ambulatory Visit: Payer: Self-pay | Admitting: Family Medicine

## 2019-06-06 DIAGNOSIS — K635 Polyp of colon: Secondary | ICD-10-CM | POA: Diagnosis not present

## 2019-06-06 DIAGNOSIS — R1310 Dysphagia, unspecified: Secondary | ICD-10-CM | POA: Diagnosis not present

## 2019-06-06 DIAGNOSIS — K222 Esophageal obstruction: Secondary | ICD-10-CM | POA: Diagnosis not present

## 2019-06-06 DIAGNOSIS — D125 Benign neoplasm of sigmoid colon: Secondary | ICD-10-CM | POA: Diagnosis not present

## 2019-06-06 DIAGNOSIS — K219 Gastro-esophageal reflux disease without esophagitis: Secondary | ICD-10-CM | POA: Diagnosis not present

## 2019-06-06 DIAGNOSIS — Z86010 Personal history of colonic polyps: Secondary | ICD-10-CM | POA: Diagnosis not present

## 2019-06-16 DIAGNOSIS — R55 Syncope and collapse: Secondary | ICD-10-CM | POA: Diagnosis not present

## 2019-06-16 DIAGNOSIS — R001 Bradycardia, unspecified: Secondary | ICD-10-CM | POA: Diagnosis not present

## 2019-06-16 DIAGNOSIS — I10 Essential (primary) hypertension: Secondary | ICD-10-CM | POA: Diagnosis not present

## 2019-06-30 DIAGNOSIS — G250 Essential tremor: Secondary | ICD-10-CM | POA: Diagnosis not present

## 2019-06-30 DIAGNOSIS — E039 Hypothyroidism, unspecified: Secondary | ICD-10-CM | POA: Diagnosis not present

## 2019-06-30 DIAGNOSIS — R001 Bradycardia, unspecified: Secondary | ICD-10-CM | POA: Diagnosis not present

## 2019-06-30 DIAGNOSIS — R55 Syncope and collapse: Secondary | ICD-10-CM | POA: Diagnosis not present

## 2019-06-30 DIAGNOSIS — I10 Essential (primary) hypertension: Secondary | ICD-10-CM | POA: Diagnosis not present

## 2019-07-04 ENCOUNTER — Encounter: Payer: Self-pay | Admitting: Family Medicine

## 2019-07-04 NOTE — Telephone Encounter (Signed)
FYI

## 2019-07-05 DIAGNOSIS — Z79890 Hormone replacement therapy (postmenopausal): Secondary | ICD-10-CM | POA: Diagnosis not present

## 2019-07-05 DIAGNOSIS — M6281 Muscle weakness (generalized): Secondary | ICD-10-CM | POA: Diagnosis not present

## 2019-07-05 DIAGNOSIS — R001 Bradycardia, unspecified: Secondary | ICD-10-CM | POA: Diagnosis not present

## 2019-07-05 DIAGNOSIS — Z87820 Personal history of traumatic brain injury: Secondary | ICD-10-CM | POA: Diagnosis not present

## 2019-07-05 DIAGNOSIS — R55 Syncope and collapse: Secondary | ICD-10-CM | POA: Diagnosis not present

## 2019-07-05 DIAGNOSIS — I10 Essential (primary) hypertension: Secondary | ICD-10-CM | POA: Diagnosis not present

## 2019-07-05 DIAGNOSIS — Z7951 Long term (current) use of inhaled steroids: Secondary | ICD-10-CM | POA: Diagnosis not present

## 2019-07-05 DIAGNOSIS — Z87891 Personal history of nicotine dependence: Secondary | ICD-10-CM | POA: Diagnosis not present

## 2019-07-05 DIAGNOSIS — Z79899 Other long term (current) drug therapy: Secondary | ICD-10-CM | POA: Diagnosis not present

## 2019-07-06 DIAGNOSIS — E538 Deficiency of other specified B group vitamins: Secondary | ICD-10-CM | POA: Diagnosis not present

## 2019-07-12 DIAGNOSIS — R55 Syncope and collapse: Secondary | ICD-10-CM | POA: Diagnosis not present

## 2019-07-19 DIAGNOSIS — R55 Syncope and collapse: Secondary | ICD-10-CM | POA: Diagnosis not present

## 2019-08-09 DIAGNOSIS — R609 Edema, unspecified: Secondary | ICD-10-CM | POA: Diagnosis not present

## 2019-09-04 DIAGNOSIS — R55 Syncope and collapse: Secondary | ICD-10-CM | POA: Diagnosis not present

## 2019-09-11 DIAGNOSIS — Z1231 Encounter for screening mammogram for malignant neoplasm of breast: Secondary | ICD-10-CM | POA: Diagnosis not present

## 2019-09-22 DIAGNOSIS — R55 Syncope and collapse: Secondary | ICD-10-CM | POA: Diagnosis not present

## 2019-09-22 DIAGNOSIS — I495 Sick sinus syndrome: Secondary | ICD-10-CM | POA: Diagnosis not present

## 2019-09-22 DIAGNOSIS — I10 Essential (primary) hypertension: Secondary | ICD-10-CM | POA: Diagnosis not present

## 2019-10-12 DIAGNOSIS — Z0000 Encounter for general adult medical examination without abnormal findings: Secondary | ICD-10-CM | POA: Diagnosis not present

## 2019-10-12 DIAGNOSIS — E039 Hypothyroidism, unspecified: Secondary | ICD-10-CM | POA: Diagnosis not present

## 2019-10-12 DIAGNOSIS — R001 Bradycardia, unspecified: Secondary | ICD-10-CM | POA: Diagnosis not present

## 2019-10-12 DIAGNOSIS — G250 Essential tremor: Secondary | ICD-10-CM | POA: Diagnosis not present

## 2019-10-12 DIAGNOSIS — Z23 Encounter for immunization: Secondary | ICD-10-CM | POA: Diagnosis not present

## 2019-10-12 DIAGNOSIS — R55 Syncope and collapse: Secondary | ICD-10-CM | POA: Diagnosis not present

## 2019-10-12 DIAGNOSIS — R609 Edema, unspecified: Secondary | ICD-10-CM | POA: Diagnosis not present

## 2019-10-12 DIAGNOSIS — E538 Deficiency of other specified B group vitamins: Secondary | ICD-10-CM | POA: Diagnosis not present

## 2019-10-12 DIAGNOSIS — I10 Essential (primary) hypertension: Secondary | ICD-10-CM | POA: Diagnosis not present

## 2019-10-20 DIAGNOSIS — Z8249 Family history of ischemic heart disease and other diseases of the circulatory system: Secondary | ICD-10-CM | POA: Diagnosis not present

## 2019-10-20 DIAGNOSIS — Z950 Presence of cardiac pacemaker: Secondary | ICD-10-CM | POA: Diagnosis not present

## 2019-10-20 DIAGNOSIS — Z7951 Long term (current) use of inhaled steroids: Secondary | ICD-10-CM | POA: Diagnosis not present

## 2019-10-20 DIAGNOSIS — I495 Sick sinus syndrome: Secondary | ICD-10-CM | POA: Diagnosis not present

## 2019-10-20 DIAGNOSIS — K219 Gastro-esophageal reflux disease without esophagitis: Secondary | ICD-10-CM | POA: Diagnosis not present

## 2019-10-20 DIAGNOSIS — Z79899 Other long term (current) drug therapy: Secondary | ICD-10-CM | POA: Diagnosis not present

## 2019-10-20 DIAGNOSIS — Z79890 Hormone replacement therapy (postmenopausal): Secondary | ICD-10-CM | POA: Diagnosis not present

## 2019-10-20 DIAGNOSIS — E039 Hypothyroidism, unspecified: Secondary | ICD-10-CM | POA: Diagnosis not present

## 2019-10-20 DIAGNOSIS — Z86010 Personal history of colonic polyps: Secondary | ICD-10-CM | POA: Diagnosis not present

## 2019-10-20 DIAGNOSIS — I10 Essential (primary) hypertension: Secondary | ICD-10-CM | POA: Diagnosis not present

## 2019-10-20 DIAGNOSIS — J45909 Unspecified asthma, uncomplicated: Secondary | ICD-10-CM | POA: Diagnosis not present

## 2019-10-20 DIAGNOSIS — R55 Syncope and collapse: Secondary | ICD-10-CM | POA: Diagnosis not present

## 2019-10-20 DIAGNOSIS — Z87891 Personal history of nicotine dependence: Secondary | ICD-10-CM | POA: Diagnosis not present

## 2019-11-06 ENCOUNTER — Other Ambulatory Visit: Payer: Self-pay | Admitting: Family Medicine

## 2019-11-21 DIAGNOSIS — Z23 Encounter for immunization: Secondary | ICD-10-CM | POA: Diagnosis not present

## 2019-11-30 DIAGNOSIS — I495 Sick sinus syndrome: Secondary | ICD-10-CM | POA: Diagnosis not present

## 2020-01-26 DIAGNOSIS — H25013 Cortical age-related cataract, bilateral: Secondary | ICD-10-CM | POA: Diagnosis not present

## 2020-01-26 DIAGNOSIS — H353111 Nonexudative age-related macular degeneration, right eye, early dry stage: Secondary | ICD-10-CM | POA: Diagnosis not present

## 2020-01-30 DIAGNOSIS — R55 Syncope and collapse: Secondary | ICD-10-CM | POA: Diagnosis not present

## 2020-01-30 DIAGNOSIS — Z950 Presence of cardiac pacemaker: Secondary | ICD-10-CM | POA: Diagnosis not present

## 2020-01-30 DIAGNOSIS — I495 Sick sinus syndrome: Secondary | ICD-10-CM | POA: Diagnosis not present

## 2020-01-30 DIAGNOSIS — M7502 Adhesive capsulitis of left shoulder: Secondary | ICD-10-CM | POA: Diagnosis not present

## 2020-02-01 DIAGNOSIS — R29898 Other symptoms and signs involving the musculoskeletal system: Secondary | ICD-10-CM | POA: Diagnosis not present

## 2020-02-01 DIAGNOSIS — M25512 Pain in left shoulder: Secondary | ICD-10-CM | POA: Diagnosis not present

## 2020-02-01 DIAGNOSIS — M25612 Stiffness of left shoulder, not elsewhere classified: Secondary | ICD-10-CM | POA: Diagnosis not present

## 2020-02-01 DIAGNOSIS — M6281 Muscle weakness (generalized): Secondary | ICD-10-CM | POA: Diagnosis not present

## 2020-02-01 DIAGNOSIS — Z950 Presence of cardiac pacemaker: Secondary | ICD-10-CM | POA: Diagnosis not present

## 2020-02-01 DIAGNOSIS — R293 Abnormal posture: Secondary | ICD-10-CM | POA: Diagnosis not present

## 2020-02-06 DIAGNOSIS — Z950 Presence of cardiac pacemaker: Secondary | ICD-10-CM | POA: Diagnosis not present

## 2020-02-06 DIAGNOSIS — R29898 Other symptoms and signs involving the musculoskeletal system: Secondary | ICD-10-CM | POA: Diagnosis not present

## 2020-02-06 DIAGNOSIS — R293 Abnormal posture: Secondary | ICD-10-CM | POA: Diagnosis not present

## 2020-02-06 DIAGNOSIS — M25512 Pain in left shoulder: Secondary | ICD-10-CM | POA: Diagnosis not present

## 2020-02-06 DIAGNOSIS — M25612 Stiffness of left shoulder, not elsewhere classified: Secondary | ICD-10-CM | POA: Diagnosis not present

## 2020-02-06 DIAGNOSIS — M6281 Muscle weakness (generalized): Secondary | ICD-10-CM | POA: Diagnosis not present

## 2020-02-12 DIAGNOSIS — H353111 Nonexudative age-related macular degeneration, right eye, early dry stage: Secondary | ICD-10-CM | POA: Diagnosis not present

## 2020-02-12 DIAGNOSIS — Z950 Presence of cardiac pacemaker: Secondary | ICD-10-CM | POA: Diagnosis not present

## 2020-02-12 DIAGNOSIS — M25512 Pain in left shoulder: Secondary | ICD-10-CM | POA: Diagnosis not present

## 2020-02-12 DIAGNOSIS — H25013 Cortical age-related cataract, bilateral: Secondary | ICD-10-CM | POA: Diagnosis not present

## 2020-02-12 DIAGNOSIS — R293 Abnormal posture: Secondary | ICD-10-CM | POA: Diagnosis not present

## 2020-02-12 DIAGNOSIS — R29898 Other symptoms and signs involving the musculoskeletal system: Secondary | ICD-10-CM | POA: Diagnosis not present

## 2020-02-12 DIAGNOSIS — H5711 Ocular pain, right eye: Secondary | ICD-10-CM | POA: Diagnosis not present

## 2020-02-12 DIAGNOSIS — M6281 Muscle weakness (generalized): Secondary | ICD-10-CM | POA: Diagnosis not present

## 2020-02-12 DIAGNOSIS — M25612 Stiffness of left shoulder, not elsewhere classified: Secondary | ICD-10-CM | POA: Diagnosis not present

## 2020-02-23 DIAGNOSIS — R293 Abnormal posture: Secondary | ICD-10-CM | POA: Diagnosis not present

## 2020-02-23 DIAGNOSIS — M6281 Muscle weakness (generalized): Secondary | ICD-10-CM | POA: Diagnosis not present

## 2020-02-23 DIAGNOSIS — R29898 Other symptoms and signs involving the musculoskeletal system: Secondary | ICD-10-CM | POA: Diagnosis not present

## 2020-02-23 DIAGNOSIS — M25612 Stiffness of left shoulder, not elsewhere classified: Secondary | ICD-10-CM | POA: Diagnosis not present

## 2020-02-23 DIAGNOSIS — M25512 Pain in left shoulder: Secondary | ICD-10-CM | POA: Diagnosis not present

## 2020-02-23 DIAGNOSIS — Z950 Presence of cardiac pacemaker: Secondary | ICD-10-CM | POA: Diagnosis not present

## 2020-02-27 DIAGNOSIS — L720 Epidermal cyst: Secondary | ICD-10-CM | POA: Diagnosis not present

## 2020-02-27 DIAGNOSIS — B351 Tinea unguium: Secondary | ICD-10-CM | POA: Diagnosis not present

## 2020-02-27 DIAGNOSIS — R238 Other skin changes: Secondary | ICD-10-CM | POA: Diagnosis not present

## 2020-02-27 DIAGNOSIS — Z7189 Other specified counseling: Secondary | ICD-10-CM | POA: Diagnosis not present

## 2020-02-27 DIAGNOSIS — D225 Melanocytic nevi of trunk: Secondary | ICD-10-CM | POA: Diagnosis not present

## 2020-02-27 DIAGNOSIS — Z789 Other specified health status: Secondary | ICD-10-CM | POA: Diagnosis not present

## 2020-02-27 DIAGNOSIS — L578 Other skin changes due to chronic exposure to nonionizing radiation: Secondary | ICD-10-CM | POA: Diagnosis not present

## 2020-02-27 DIAGNOSIS — B078 Other viral warts: Secondary | ICD-10-CM | POA: Diagnosis not present

## 2020-02-27 DIAGNOSIS — L814 Other melanin hyperpigmentation: Secondary | ICD-10-CM | POA: Diagnosis not present

## 2020-02-27 DIAGNOSIS — L538 Other specified erythematous conditions: Secondary | ICD-10-CM | POA: Diagnosis not present

## 2020-02-27 DIAGNOSIS — X32XXXA Exposure to sunlight, initial encounter: Secondary | ICD-10-CM | POA: Diagnosis not present

## 2020-02-29 DIAGNOSIS — M25512 Pain in left shoulder: Secondary | ICD-10-CM | POA: Diagnosis not present

## 2020-02-29 DIAGNOSIS — M25612 Stiffness of left shoulder, not elsewhere classified: Secondary | ICD-10-CM | POA: Diagnosis not present

## 2020-02-29 DIAGNOSIS — R293 Abnormal posture: Secondary | ICD-10-CM | POA: Diagnosis not present

## 2020-02-29 DIAGNOSIS — M6281 Muscle weakness (generalized): Secondary | ICD-10-CM | POA: Diagnosis not present

## 2020-02-29 DIAGNOSIS — Z950 Presence of cardiac pacemaker: Secondary | ICD-10-CM | POA: Diagnosis not present

## 2020-02-29 DIAGNOSIS — R29898 Other symptoms and signs involving the musculoskeletal system: Secondary | ICD-10-CM | POA: Diagnosis not present

## 2020-03-05 ENCOUNTER — Ambulatory Visit: Payer: Self-pay

## 2020-03-05 DIAGNOSIS — R293 Abnormal posture: Secondary | ICD-10-CM | POA: Diagnosis not present

## 2020-03-05 DIAGNOSIS — M6281 Muscle weakness (generalized): Secondary | ICD-10-CM | POA: Diagnosis not present

## 2020-03-05 DIAGNOSIS — R29898 Other symptoms and signs involving the musculoskeletal system: Secondary | ICD-10-CM | POA: Diagnosis not present

## 2020-03-05 DIAGNOSIS — M25612 Stiffness of left shoulder, not elsewhere classified: Secondary | ICD-10-CM | POA: Diagnosis not present

## 2020-03-05 DIAGNOSIS — M25512 Pain in left shoulder: Secondary | ICD-10-CM | POA: Diagnosis not present

## 2020-03-05 DIAGNOSIS — Z950 Presence of cardiac pacemaker: Secondary | ICD-10-CM | POA: Diagnosis not present

## 2020-03-12 DIAGNOSIS — R293 Abnormal posture: Secondary | ICD-10-CM | POA: Diagnosis not present

## 2020-03-12 DIAGNOSIS — Z950 Presence of cardiac pacemaker: Secondary | ICD-10-CM | POA: Diagnosis not present

## 2020-03-12 DIAGNOSIS — M25512 Pain in left shoulder: Secondary | ICD-10-CM | POA: Diagnosis not present

## 2020-03-12 DIAGNOSIS — R29898 Other symptoms and signs involving the musculoskeletal system: Secondary | ICD-10-CM | POA: Diagnosis not present

## 2020-03-12 DIAGNOSIS — M6281 Muscle weakness (generalized): Secondary | ICD-10-CM | POA: Diagnosis not present

## 2020-03-12 DIAGNOSIS — M25612 Stiffness of left shoulder, not elsewhere classified: Secondary | ICD-10-CM | POA: Diagnosis not present

## 2020-03-15 DIAGNOSIS — H5711 Ocular pain, right eye: Secondary | ICD-10-CM | POA: Diagnosis not present

## 2020-03-15 DIAGNOSIS — H25013 Cortical age-related cataract, bilateral: Secondary | ICD-10-CM | POA: Diagnosis not present

## 2020-03-15 DIAGNOSIS — H43392 Other vitreous opacities, left eye: Secondary | ICD-10-CM | POA: Diagnosis not present

## 2020-03-15 DIAGNOSIS — H5203 Hypermetropia, bilateral: Secondary | ICD-10-CM | POA: Diagnosis not present

## 2020-03-15 DIAGNOSIS — H353111 Nonexudative age-related macular degeneration, right eye, early dry stage: Secondary | ICD-10-CM | POA: Diagnosis not present
